# Patient Record
Sex: Male | Born: 1955 | Race: White | Hispanic: No | Marital: Married | Smoking: Former smoker
Health system: Southern US, Community
[De-identification: ages and names within clinical notes are randomized; demographics above are authoritative.]

## PROBLEM LIST (undated history)

## (undated) DIAGNOSIS — I10 Essential (primary) hypertension: Secondary | ICD-10-CM

## (undated) DIAGNOSIS — I499 Cardiac arrhythmia, unspecified: Secondary | ICD-10-CM

## (undated) DIAGNOSIS — E785 Hyperlipidemia, unspecified: Secondary | ICD-10-CM

## (undated) DIAGNOSIS — I2510 Atherosclerotic heart disease of native coronary artery without angina pectoris: Secondary | ICD-10-CM

## (undated) HISTORY — DX: Essential (primary) hypertension: I10

## (undated) HISTORY — DX: Cardiac arrhythmia, unspecified: I49.9

## (undated) HISTORY — DX: Atherosclerotic heart disease of native coronary artery without angina pectoris: I25.10

## (undated) HISTORY — DX: Hyperlipidemia, unspecified: E78.5

---

## 1998-02-23 ENCOUNTER — Emergency Department (HOSPITAL_COMMUNITY): Admission: EM | Admit: 1998-02-23 | Discharge: 1998-02-24 | Payer: Self-pay | Admitting: Emergency Medicine

## 1999-09-07 ENCOUNTER — Emergency Department (HOSPITAL_COMMUNITY): Admission: EM | Admit: 1999-09-07 | Discharge: 1999-09-07 | Payer: Self-pay | Admitting: Internal Medicine

## 1999-09-19 ENCOUNTER — Emergency Department (HOSPITAL_COMMUNITY): Admission: EM | Admit: 1999-09-19 | Discharge: 1999-09-19 | Payer: Self-pay | Admitting: Emergency Medicine

## 2000-01-18 ENCOUNTER — Emergency Department (HOSPITAL_COMMUNITY): Admission: EM | Admit: 2000-01-18 | Discharge: 2000-01-18 | Payer: Self-pay

## 2004-07-11 ENCOUNTER — Emergency Department (HOSPITAL_COMMUNITY): Admission: EM | Admit: 2004-07-11 | Discharge: 2004-07-11 | Payer: Self-pay | Admitting: Emergency Medicine

## 2004-07-20 ENCOUNTER — Emergency Department (HOSPITAL_COMMUNITY): Admission: EM | Admit: 2004-07-20 | Discharge: 2004-07-20 | Payer: Self-pay | Admitting: Emergency Medicine

## 2004-07-30 ENCOUNTER — Ambulatory Visit (HOSPITAL_COMMUNITY): Admission: RE | Admit: 2004-07-30 | Discharge: 2004-07-30 | Payer: Self-pay | Admitting: Orthopedic Surgery

## 2004-09-13 ENCOUNTER — Emergency Department (HOSPITAL_COMMUNITY): Admission: EM | Admit: 2004-09-13 | Discharge: 2004-09-13 | Payer: Self-pay | Admitting: Emergency Medicine

## 2005-11-06 ENCOUNTER — Emergency Department (HOSPITAL_COMMUNITY): Admission: EM | Admit: 2005-11-06 | Discharge: 2005-11-06 | Payer: Self-pay | Admitting: Emergency Medicine

## 2005-12-01 IMAGING — CR DG SHOULDER 2+V*L*
2 series · 2 of 2 positions shown · non-contrast
Comparison: 07/20/04 at [DATE] a.m.

CLINICAL DATA: Postreduction. 
 LEFT SHOULDER ? 2 VIEW:

[view not recorded (1 of 2)]
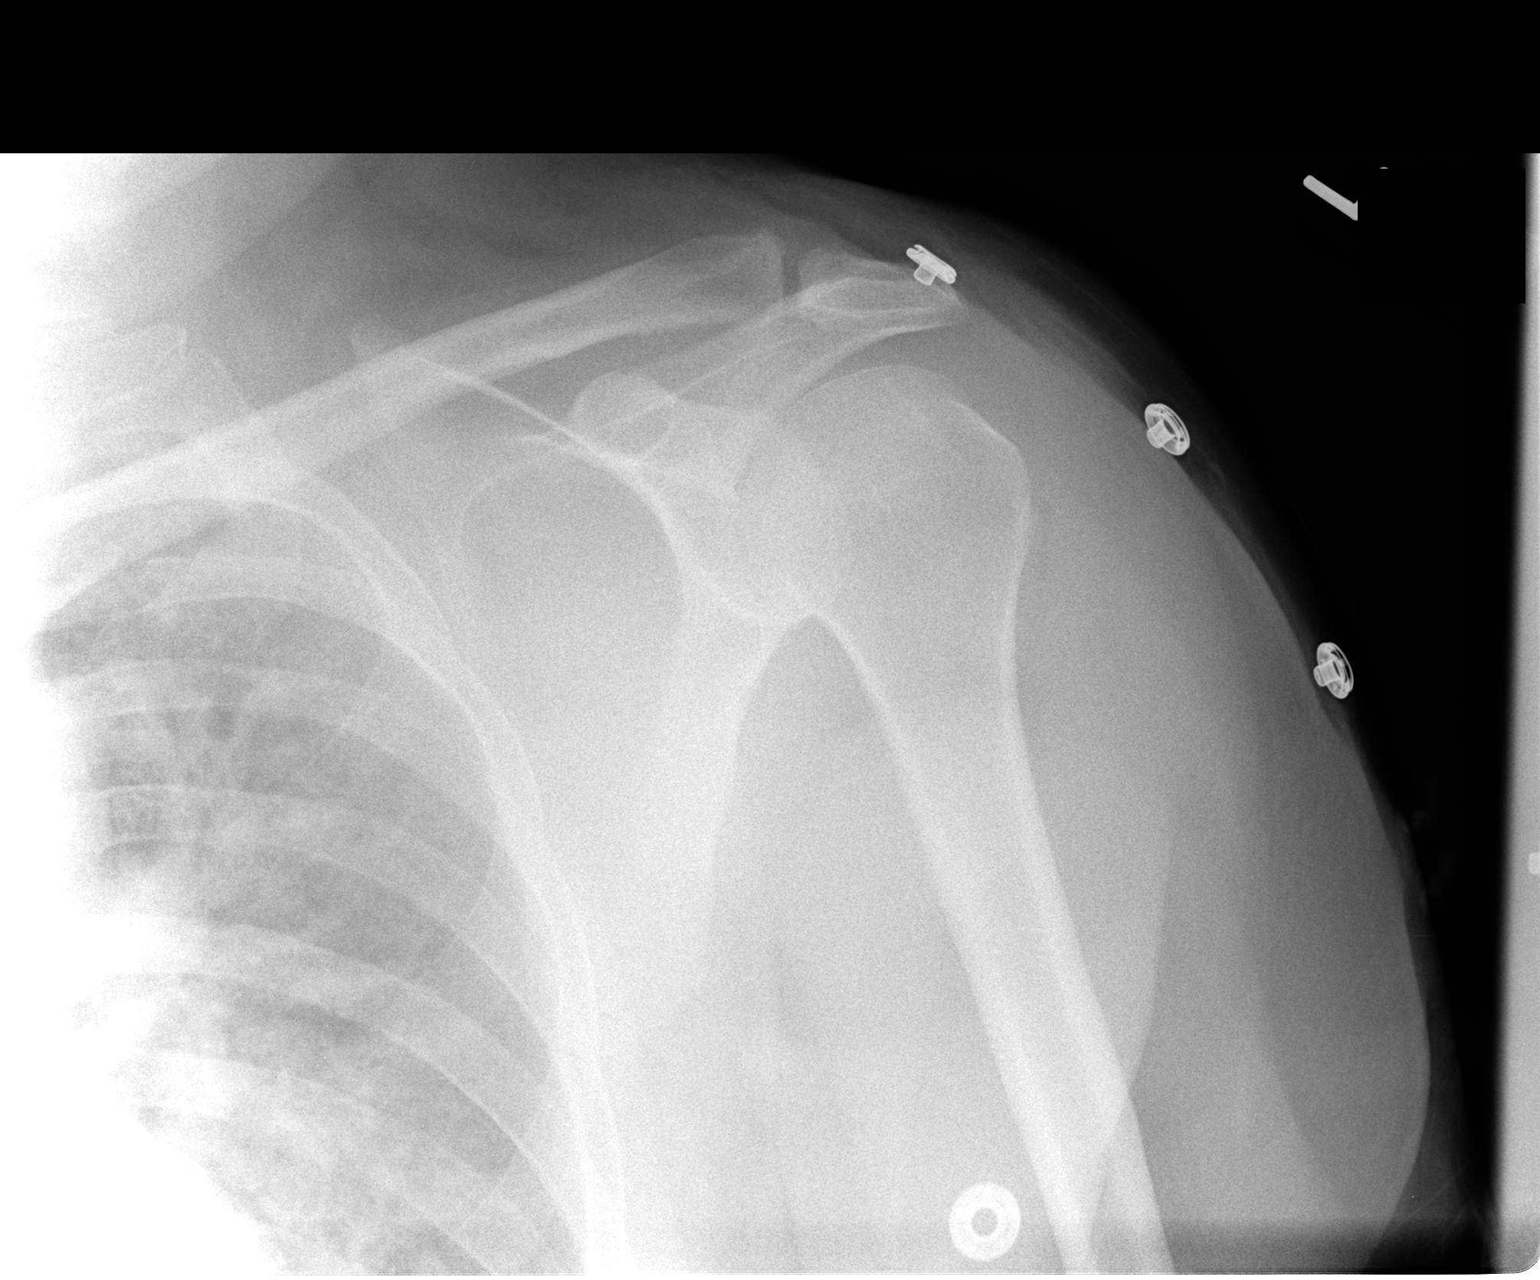

[view not recorded (2 of 2)]
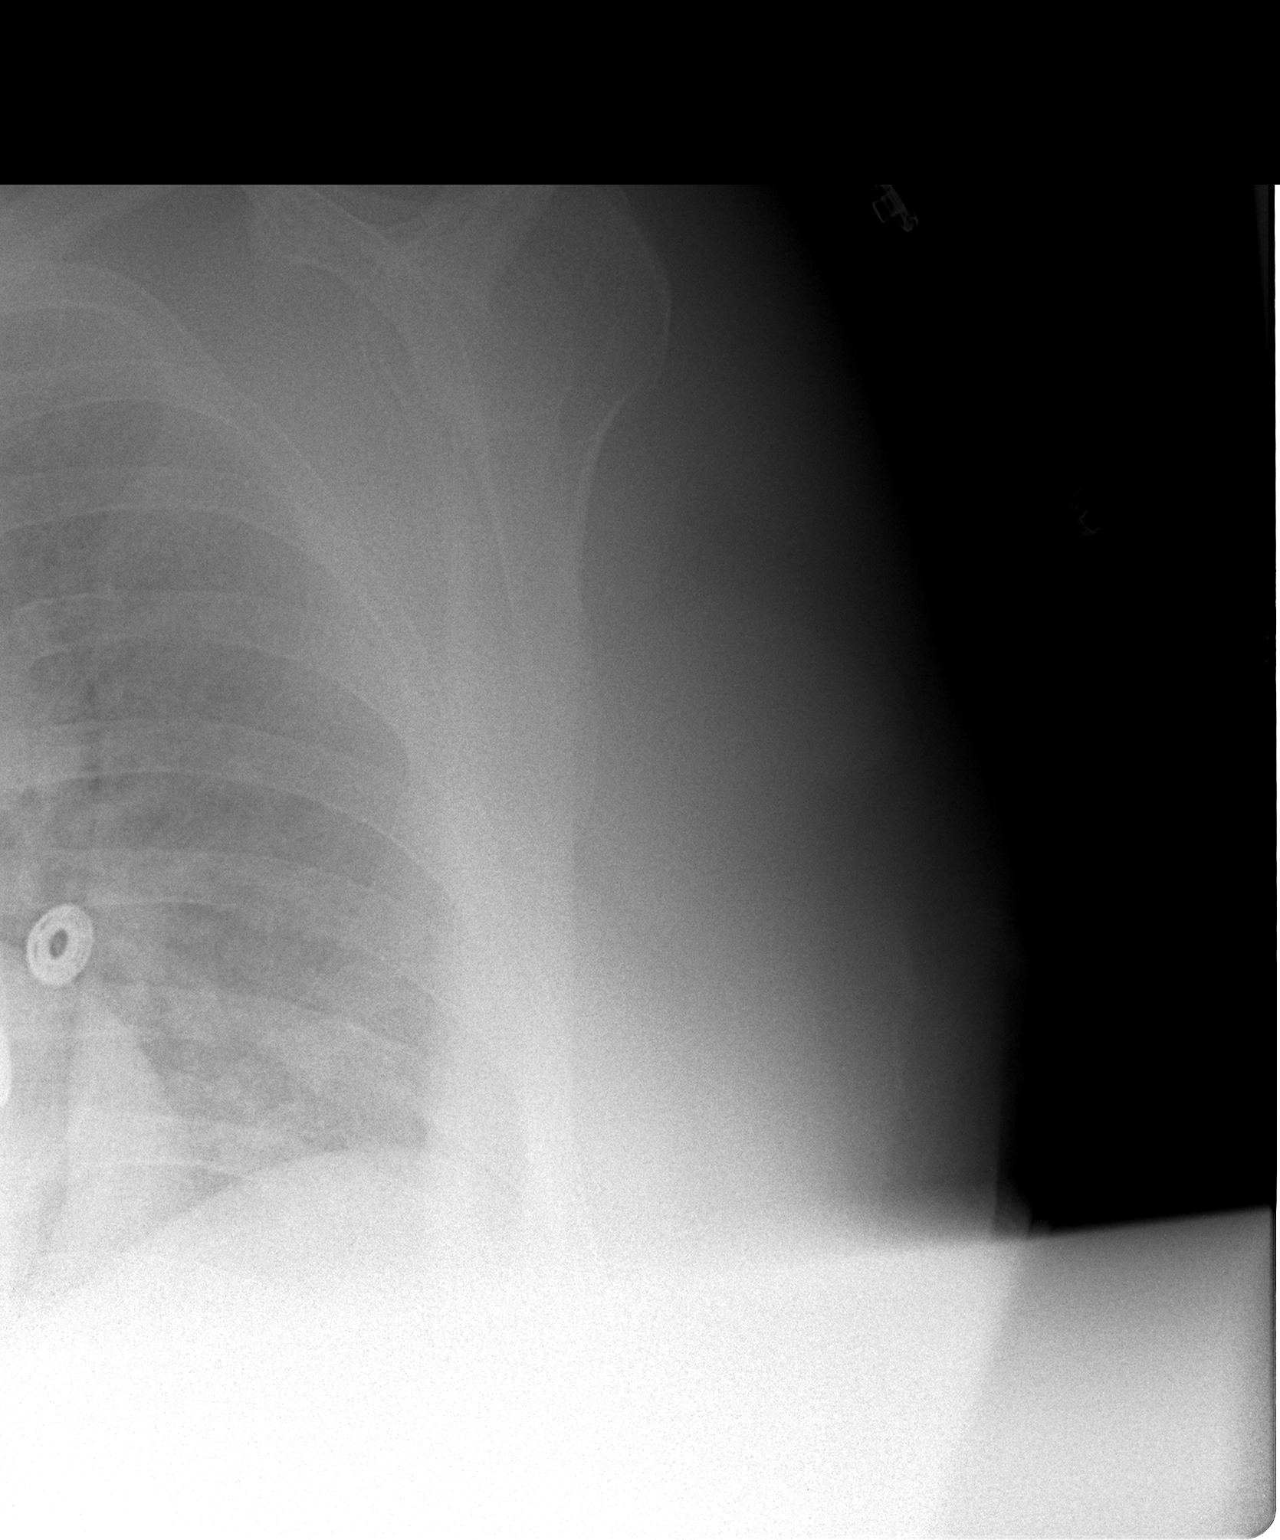

[2 of 2 positions shown; findings below may reference images not displayed]

Left humeral head has been reduced.  Hill-Sachs deformity.  Moderate degenerative changes acromioclavicular joint.
IMPRESSION: 1.  Reduction left humeral head with Hill-Sachs deformity noted. 
 2.  Moderate acromioclavicular joint degenerative changes.

## 2010-01-31 ENCOUNTER — Emergency Department (HOSPITAL_COMMUNITY): Admission: EM | Admit: 2010-01-31 | Discharge: 2010-02-01 | Payer: Self-pay | Admitting: Emergency Medicine

## 2011-12-02 ENCOUNTER — Ambulatory Visit: Payer: Self-pay | Admitting: Family Medicine

## 2011-12-02 VITALS — BP 181/98 | HR 97 | Temp 98.2°F | Resp 16 | Ht 66.25 in | Wt 200.2 lb

## 2011-12-02 DIAGNOSIS — I10 Essential (primary) hypertension: Secondary | ICD-10-CM

## 2011-12-02 DIAGNOSIS — R21 Rash and other nonspecific skin eruption: Secondary | ICD-10-CM

## 2011-12-02 DIAGNOSIS — L301 Dyshidrosis [pompholyx]: Secondary | ICD-10-CM

## 2011-12-02 LAB — POCT SKIN KOH: Skin KOH, POC: NEGATIVE

## 2011-12-02 MED ORDER — CLOBETASOL PROPIONATE 0.05 % EX OINT: TOPICAL_OINTMENT | Freq: Two times a day (BID) | CUTANEOUS | Status: DC

## 2011-12-02 MED ORDER — LISINOPRIL-HYDROCHLOROTHIAZIDE 10-12.5 MG PO TABS: 1.0000 | ORAL_TABLET | Freq: Every day | ORAL | Status: DC

## 2011-12-02 NOTE — Patient Instructions (Signed)
Ointment twice daily on the hands and feet. Rub in well.  Take blood pressure medication.  Work hard on weight loss.  Regular exercise.  Return in 2 months.

## 2011-12-02 NOTE — Progress Notes (Signed)
  Subjective: Patient is here complaining of a rash on his hands.  2 Years agohe had it on his feet. He has tried various over-the-counter medicines and mail-order medicines without help.  His blood pressure has been high for a long time. He has never had any treatment for it.  Objective: Thick scaly dermatosis of palms of his hands and soles of his feet skin scraping her palm was done. KOH is negative. Chest clear. Heart regular.  Assessment: Psoriatic eczema of hands and feet Hypertension  Plan: Clobetasol If not improving will do a punch bx and try some dovonox.  Lisinopril/hct  rtc 2 mo.

## 2012-05-04 ENCOUNTER — Ambulatory Visit: Payer: Self-pay | Admitting: Family Medicine

## 2012-05-04 VITALS — BP 155/108 | HR 88 | Temp 97.9°F | Resp 18 | Ht 66.5 in | Wt 199.2 lb

## 2012-05-04 DIAGNOSIS — R059 Cough, unspecified: Secondary | ICD-10-CM

## 2012-05-04 DIAGNOSIS — R05 Cough: Secondary | ICD-10-CM

## 2012-05-04 DIAGNOSIS — I10 Essential (primary) hypertension: Secondary | ICD-10-CM

## 2012-05-04 MED ORDER — PREDNISONE 20 MG PO TABS: ORAL_TABLET | ORAL | Status: DC

## 2012-05-04 MED ORDER — LISINOPRIL-HYDROCHLOROTHIAZIDE 20-25 MG PO TABS: 1.0000 | ORAL_TABLET | Freq: Every day | ORAL | Status: DC

## 2012-05-04 MED ORDER — BENZONATATE 100 MG PO CAPS: ORAL_CAPSULE | ORAL | Status: DC

## 2012-05-04 NOTE — Progress Notes (Signed)
Subjective: Patient checks his blood pressure daily at home. It's been running high. He takes his medicine faithfully, and has tolerated it well for years.  For the last 10 days she's been having a cough. The sputum is clear and whitish he coughs in paroxysms in the nighttime sometimes when he feels like he is mucus in his throat. Some time ago he had nighttime episodes like this every fall, but not last few years.  Objective: Mildly overweight white male no acute distress. TMs normal. Throat clear. Neck supple without nodes thyromegaly. Chest is clear to auscultation. Heart regular without murmurs gallops or arrhythmias. Pressure was 152/94 when I took it. Abdomen soft.  Assessment: Cough Probable post viral bronchitis Hypertension unsatisfactory control  Plan: Increase his blood pressure medicines Treat the cough with some prednisone and symptomatic care. Return if worse. No x-rays or labs today.

## 2012-05-04 NOTE — Patient Instructions (Signed)
Drink plenty of fluids  Double your dose of  blood pressure medicine to 2 pills every morning. When they ran out, the new prescription will be equivalent to double the current dose. Continue to monitor your blood pressures, with a goal of 120/80 or less. Get regular exercise, losing weight, and avoid excessive salt in the diet.  Take over-the-counter loratidine (Claritin) one daily Use cough pills and prednisone as directed. If symptoms persist let me know.

## 2012-05-09 ENCOUNTER — Ambulatory Visit (INDEPENDENT_AMBULATORY_CARE_PROVIDER_SITE_OTHER): Payer: Self-pay | Admitting: Physician Assistant

## 2012-05-09 VITALS — BP 158/82 | HR 72 | Temp 98.7°F | Resp 16 | Ht 66.25 in | Wt 204.0 lb

## 2012-05-09 DIAGNOSIS — R0982 Postnasal drip: Secondary | ICD-10-CM

## 2012-05-09 DIAGNOSIS — R0981 Nasal congestion: Secondary | ICD-10-CM

## 2012-05-09 DIAGNOSIS — J329 Chronic sinusitis, unspecified: Secondary | ICD-10-CM

## 2012-05-09 DIAGNOSIS — J3489 Other specified disorders of nose and nasal sinuses: Secondary | ICD-10-CM

## 2012-05-09 DIAGNOSIS — I10 Essential (primary) hypertension: Secondary | ICD-10-CM | POA: Insufficient documentation

## 2012-05-09 MED ORDER — IPRATROPIUM BROMIDE 0.03 % NA SOLN: 2.0000 | Freq: Two times a day (BID) | NASAL | Status: DC

## 2012-05-09 NOTE — Patient Instructions (Signed)
Begin using the Atrovent nasal spray twice daily for relief of nasal congestion and post-nasal drainage.  Also being using Claritin daily (not the kind with decongestant though!)  Finish the medications that Dr. Alwyn Ren gave you.  I do not think you need an antibiotic at this time.  I am reassured that you do not have a fever, that your lungs sound clear, and that you do not have sinus pain or sore throat.  If you are not improving in several days, or if you feel like you are getting worse, let us know and we can call in an antibiotic for you.

## 2012-05-09 NOTE — Progress Notes (Signed)
Subjective:    Patient ID: Colin Pena, male    DOB: Sep 11, 1955, 56 y.o.   MRN: [id number]  HPI  Colin Pena is a 56 yr old male who was seen here 5 days ago for URI symptoms.  He was prescribed prednisone and Tessalon.  States the prednisone has largely resolved his sinus pressure and that Tessalon has helped with cough, but is now complaining of "lots of mucus" in his throat that started two days ago.  He believes he has an infection and needs an antibiotic.  His voice is hoarse.  His throat is not sore.  He is coughing minimally and it is non-productive.  Feels like he has lots of mucus in his throat particularly when he lays down at night.  He has not had fever or chills.  No sinus or tooth pain.    Taking Mucinex, Tessalon, and prednisone.     Review of Systems  Constitutional: Negative for fever and chills.  HENT: Positive for congestion and postnasal drip. Negative for ear pain, sore throat, rhinorrhea and sinus pressure.   Respiratory: Negative for cough, shortness of breath and wheezing.   Cardiovascular: Negative.   Gastrointestinal: Negative.   Musculoskeletal: Negative.   Neurological: Negative for headaches.       Objective:   Physical Exam  Vitals reviewed. Constitutional: He is oriented to person, place, and time. He appears well-developed and well-nourished. No distress.  HENT:  Head: Normocephalic and atraumatic.  Right Ear: Tympanic membrane and ear canal normal.  Left Ear: Tympanic membrane and ear canal normal.  Nose: Nose normal. Right sinus exhibits no maxillary sinus tenderness and no frontal sinus tenderness. Left sinus exhibits no maxillary sinus tenderness and no frontal sinus tenderness.  Mouth/Throat: Uvula is midline and mucous membranes are normal. Posterior oropharyngeal erythema present. No oropharyngeal exudate, posterior oropharyngeal edema or tonsillar abscesses.       Post-nasal drainage  Neck: Neck supple.  Cardiovascular: Normal rate, regular  rhythm, normal heart sounds and intact distal pulses.  Exam reveals no gallop and no friction rub.   No murmur heard. Pulmonary/Chest: Effort normal and breath sounds normal. He has no wheezes. He has no rales.  Lymphadenopathy:    He has no cervical adenopathy.  Neurological: He is alert and oriented to person, place, and time.  Skin: Skin is warm and dry.  Psychiatric: He has a normal mood and affect. His behavior is normal.      Filed Vitals:   05/09/12 1622  BP: 158/82  Pulse: 72  Temp: 98.7 F (37.1 C)  Resp: 16        Assessment & Plan:   1. Post-nasal drainage  ipratropium (ATROVENT) 0.03 % nasal spray  2. Nasal congestion  ipratropium (ATROVENT) 0.03 % nasal spray  3. Hypertension       Colin Pena is a 56 yr old male here with post-nasal drainage and nasal congestion.  I do not think he needs an antibiotic at this time.  He is afebrile, lungs are CTA, no sinus tenderness.  There is post-nasal drainage in the posterior oropharynx but no exudate or swelling.  His throat is not sore.  I suspect his symptoms are due to post-nasal drainage.  I have prescribed Atrovent nasal spray and encouraged him to use Claritin as well.  Cautioned him against using products with pseudoephedrine due to his underlying hypertension.  Encouraged him to finish the medications Dr. Alwyn Ren prescribed for him.  Encouraged him to push fluids and rest  as much as possible.  If he is worsening or not improving, we can call in antibiotic for him at that time.  Pt is in agreement with this plan.

## 2012-05-23 ENCOUNTER — Telehealth: Payer: Self-pay

## 2012-05-23 MED ORDER — AMOXICILLIN 875 MG PO TABS: 875.0000 mg | ORAL_TABLET | Freq: Two times a day (BID) | ORAL | Status: DC

## 2012-05-23 NOTE — Telephone Encounter (Signed)
PT STATES HE IS STILL FULL OF A LOT OF MUCUS AND THINK HE MAY HAVE AN INFECTION. HAVE ALREADY BEEN SEEN 2 FOR THE SAME THING AND CAN'T AFFORD TO COME BACK IN WOULD LIKE TO KNOW IF WE COULD CALL HIM SOMETHING ELSE IN. PLEASE CALL [phone]   [facility] ON [address]

## 2012-05-23 NOTE — Telephone Encounter (Signed)
Rx for amoxicillin sent to pharmacy. If symptoms persist after this course of treatment, needs another OV

## 2012-05-24 NOTE — Telephone Encounter (Signed)
Left message for him to advise  

## 2012-11-13 ENCOUNTER — Other Ambulatory Visit: Payer: Self-pay | Admitting: Family Medicine

## 2012-11-17 ENCOUNTER — Other Ambulatory Visit: Payer: Self-pay | Admitting: Family Medicine

## 2013-02-17 ENCOUNTER — Encounter (HOSPITAL_COMMUNITY): Payer: Self-pay | Admitting: *Deleted

## 2013-02-17 ENCOUNTER — Emergency Department (HOSPITAL_COMMUNITY)
Admission: EM | Admit: 2013-02-17 | Discharge: 2013-02-17 | Disposition: A | Discharge status: Home / Self Care | Payer: Self-pay | Attending: Emergency Medicine | Admitting: Emergency Medicine

## 2013-02-17 ENCOUNTER — Emergency Department (HOSPITAL_COMMUNITY): Payer: Self-pay

## 2013-02-17 DIAGNOSIS — Y9389 Activity, other specified: Secondary | ICD-10-CM | POA: Insufficient documentation

## 2013-02-17 DIAGNOSIS — S43005A Unspecified dislocation of left shoulder joint, initial encounter: Secondary | ICD-10-CM

## 2013-02-17 DIAGNOSIS — Y929 Unspecified place or not applicable: Secondary | ICD-10-CM | POA: Insufficient documentation

## 2013-02-17 DIAGNOSIS — Z87891 Personal history of nicotine dependence: Secondary | ICD-10-CM | POA: Insufficient documentation

## 2013-02-17 DIAGNOSIS — X500XXA Overexertion from strenuous movement or load, initial encounter: Secondary | ICD-10-CM | POA: Insufficient documentation

## 2013-02-17 DIAGNOSIS — Z79899 Other long term (current) drug therapy: Secondary | ICD-10-CM | POA: Insufficient documentation

## 2013-02-17 DIAGNOSIS — I10 Essential (primary) hypertension: Secondary | ICD-10-CM | POA: Insufficient documentation

## 2013-02-17 DIAGNOSIS — S43016A Anterior dislocation of unspecified humerus, initial encounter: Secondary | ICD-10-CM | POA: Insufficient documentation

## 2013-02-17 MED ORDER — IBUPROFEN 600 MG PO TABS: 600.0000 mg | ORAL_TABLET | Freq: Four times a day (QID) | ORAL | Status: DC | PRN

## 2013-02-17 MED ORDER — PROPOFOL 10 MG/ML IV EMUL
INTRAVENOUS | Status: AC | PRN
  Administered 2013-02-17: 50 mg/h via INTRAVENOUS

## 2013-02-17 MED ORDER — HYDROMORPHONE HCL PF 1 MG/ML IJ SOLN
1.0000 mg | Freq: Once | INTRAMUSCULAR | Status: AC
  Administered 2013-02-17: 1 mg via INTRAVENOUS
  Filled 2013-02-17: qty 1

## 2013-02-17 MED ORDER — ONDANSETRON HCL 4 MG/2ML IJ SOLN
4.0000 mg | Freq: Once | INTRAMUSCULAR | Status: AC
  Administered 2013-02-17: 4 mg via INTRAVENOUS
  Filled 2013-02-17: qty 2

## 2013-02-17 MED ORDER — HYDROCODONE-ACETAMINOPHEN 5-325 MG PO TABS: ORAL_TABLET | ORAL | Status: DC

## 2013-02-17 MED ORDER — BUPIVACAINE HCL (PF) 0.5 % IJ SOLN
50.0000 mL | Freq: Once | INTRAMUSCULAR | Status: AC
  Administered 2013-02-17: 50 mL

## 2013-02-17 MED ORDER — ONDANSETRON 4 MG PO TBDP: 4.0000 mg | ORAL_TABLET | Freq: Three times a day (TID) | ORAL | Status: DC | PRN

## 2013-02-17 MED ORDER — PROPOFOL 10 MG/ML IV BOLUS
INTRAVENOUS | Status: AC
  Filled 2013-02-17: qty 1

## 2013-02-17 MED ORDER — PROPOFOL 10 MG/ML IV BOLUS
0.5000 mg/kg | Freq: Once | INTRAVENOUS | Status: AC
  Administered 2013-02-17: 200 mg via INTRAVENOUS

## 2013-02-17 NOTE — ED Provider Notes (Signed)
Procedure note. Shoulder reduction Patient was sedated with propofol. He is under full critical monitoring during this period there were no complications. Timeout was taken. Patient identified by 2 means. Suture using scapular manipulation gentle axial traction and external rotation shoulders is a reduced. He is awake following anesthesia. Sling and swath applied. Postreduction x-ray pending. There were no immediate complications.  Claudean Kinds, MD 02/17/13 [phone]

## 2013-02-17 NOTE — ED Notes (Signed)
Pt states that he has a hx of shoulder dislocation in the past and was walking and bent arm back and states that he feels that it is dislocated again, 20g rt ac 4mg  zofran and fen given pta, lt arm in sling

## 2013-02-17 NOTE — ED Provider Notes (Signed)
Procedural sedation Performed by: Claudean Kinds Consent: Verbal consent obtained. Risks and benefits: risks, benefits and alternatives were discussed Required items: required blood products, implants, devices, and special equipment available Patient identity confirmed: arm band and provided demographic data Time out: Immediately prior to procedure a "time out" was called to verify the correct patient, procedure, equipment, support staff and site/side marked as required.  Sedation type: moderate (conscious) sedation NPO time confirmed and considedered  Sedatives: PROPOFOL  Physician Time at Bedside: 25 minutes  Start:  08:10.  End 08:35  Vitals: Vital signs were monitored during sedation. Cardiac Monitor, pulse oximeter Patient tolerance: Patient tolerated the procedure well with no immediate complications. Comments: Pt with uneventful recovered. Returned to pre-procedural sedation baseline    Claudean Kinds, MD 02/17/13 [phone]

## 2013-02-17 NOTE — ED Notes (Signed)
Bed: UJ81 Expected date:  Expected time:  Means of arrival:  Comments: EMS 57yo M, shoulder dislocation

## 2013-02-17 NOTE — ED Provider Notes (Signed)
CSN: [id number]     Arrival date & time 02/17/13  0710 History   First MD Initiated Contact with Patient 02/17/13 [phone]     Chief Complaint  Patient presents with  . Shoulder Injury   (Consider location/radiation/quality/duration/timing/severity/associated sxs/prior Treatment) HPI Comments: Pt with h/o shoulder dislocation several years ago -- presents with acute onset of L shoulder pain just PTA when he ran into a metal pole while trying to reach for the pole with his arm extended in front of him. Arm held in flexion. No neck pain. Sensation/pulses intact. No other complaint. Fentanyl per EMS. The onset of this condition was acute. The course is constant. Aggravating factors: movement. Alleviating factors: none. Last oral intake last night.      Patient is a 57 y.o. male presenting with shoulder injury. The history is provided by the patient.  Shoulder Injury Associated symptoms include arthralgias. Pertinent negatives include no chest pain, coughing, fever, headaches, joint swelling, myalgias, nausea, neck pain, numbness, rash, sore throat, vomiting or weakness.    Past Medical History  Diagnosis Date  . Hypertension    History reviewed. No pertinent past surgical history. Family History  Problem Relation Age of Onset  . Cancer Father   . Hypertension Sister   . Cancer Maternal Aunt   . Heart disease Paternal Uncle   . Cancer Maternal Uncle   . Cancer Maternal Uncle   . Cancer Paternal Uncle   . Cancer Paternal Uncle    History  Substance Use Topics  . Smoking status: Former Games developer  . Smokeless tobacco: Not on file  . Alcohol Use: Not on file    Review of Systems  Constitutional: Positive for activity change. Negative for fever.  HENT: Negative for sore throat, rhinorrhea and neck pain.   Eyes: Negative for redness.  Respiratory: Negative for cough.   Cardiovascular: Negative for chest pain.  Gastrointestinal: Negative for nausea and vomiting.  Genitourinary: Negative  for dysuria.  Musculoskeletal: Positive for arthralgias. Negative for myalgias, back pain, joint swelling and gait problem.  Skin: Negative for rash and wound.  Neurological: Negative for weakness, numbness and headaches.    Allergies  Codeine  Home Medications   Current Outpatient Rx  Name  Route  Sig  Dispense  Refill  . amoxicillin (AMOXIL) 875 MG tablet   Oral   Take 1 tablet (875 mg total) by mouth 2 (two) times daily.   20 tablet   0   . benzonatate (TESSALON) 100 MG capsule      Use 1-2 tablets 3 times daily as necessary for cough. May be used with other cough medicines if needed.   30 capsule   0   . clobetasol ointment (TEMOVATE) 0.05 %      APPLY TOPICALLY 2 (TWO) TIMES DAILY.   60 g   2   . ipratropium (ATROVENT) 0.03 % nasal spray   Nasal   Place 2 sprays into the nose 2 (two) times daily.   30 mL   5   . lisinopril-hydrochlorothiazide (PRINZIDE,ZESTORETIC) 20-25 MG per tablet   Oral   Take 1 tablet by mouth daily. PATIENT NEEDS OFFICE VISIT FOR ADDITIONAL REFILLS   30 tablet   0   . predniSONE (DELTASONE) 20 MG tablet      Take 3 daily for 2 days, then 2 daily for 2 days, then 1 daily for 2 days   12 tablet   0    BP 139/81  Pulse 72  Temp(Src) 97.9 F (  36.6 C) (Oral)  Resp 15  SpO2 96% Physical Exam  Nursing note and vitals reviewed. Constitutional: He appears well-developed and well-nourished.  HENT:  Head: Normocephalic and atraumatic.  Mouth/Throat: Oropharynx is clear and moist.  Mallampati class 2  Eyes: Conjunctivae are normal. Right eye exhibits no discharge. Left eye exhibits no discharge.  Neck: Normal range of motion. Neck supple.  Cardiovascular: Normal rate, regular rhythm and normal heart sounds.   Pulmonary/Chest: Effort normal and breath sounds normal.  Abdominal: Soft. There is no tenderness.  Musculoskeletal:       Left shoulder: He exhibits decreased range of motion, tenderness and deformity (humeral head dislocated  anteriorly).       Cervical back: Normal.       Left upper arm: Normal.       Left forearm: Normal.       Left hand: Normal. Normal sensation noted. Decreased sensation is not present in the ulnar distribution, is not present in the medial redistribution and is not present in the radial distribution. Normal strength noted.  Neurological: He is alert.  Skin: Skin is warm and dry.  Psychiatric: He has a normal mood and affect.    ED Course  Procedures (including critical care time) Labs Review Labs Reviewed - No data to display Imaging Review Dg Shoulder Left  02/17/2013   *RADIOLOGY REPORT*  Clinical Data: Left shoulder dislocation, status post reduction  LEFT SHOULDER - 2+ VIEW  Comparison: Film from earlier in the same day.  Findings: The humeral head is been relocated.  No acute fracture is noted.  Degenerative changes of the acromioclavicular joint are seen.  Mild Hill-Sachs deformity is seen.  IMPRESSION: Relocation of the left humeral head.   Original Report Authenticated By: Alcide Clever, M.D.   Dg Shoulder Left  02/17/2013   *RADIOLOGY REPORT*  Clinical Data: Recent traumatic injury with pain  LEFT SHOULDER - 2+ VIEW  Comparison: 07/20/2004  Findings: There is noted dislocation of the humeral head anteriorly and inferiorly.  No associated fracture is noted.  No other focal abnormality is seen.  IMPRESSION: Anterior inferior dislocation of the left humeral head   Original Report Authenticated By: Alcide Clever, M.D.    7:28 AM Patient seen and examined. Work-up initiated. Medications ordered.   Vital signs reviewed and are as follows: Filed Vitals:   02/17/13 0715  BP: 139/81  Pulse: 72  Temp: 97.9 F (36.6 C)  Resp: 15   Pt d/w and seen by Dr. Fayrene Fearing.   Sedation and reduction per Dr. Fayrene Fearing note. 10cc lidocaine 1% was injected into the joint prior to reduction.   9:24 AM Post-reduction films reviewed by myself. Pt awake and alert, comfortable. Will d/c to home with pain  medication and ortho f/u. Instructed to use sling and continue non-weight bearing with L arm.   Patient was counseled on RICE protocol and told to rest injury, use ice for no longer than 15 minutes every hour, compress the area, and elevate above the level of their heart as much as possible to reduce swelling.  Questions answered.  Patient verbalized understanding.    Patient counseled on use of narcotic pain medications. Counseled not to combine these medications with others containing tylenol. Urged not to drink alcohol, drive, or perform any other activities that requires focus while taking these medications. The patient verbalizes understanding and agrees with the plan.   MDM   1. Shoulder dislocation, left, initial encounter    Pt with shoulder dislocation, reduced. UE  neurovascularly intact before and after procedure. Pt appears well. Ortho referral given.     Renne Crigler, PA-C 02/17/13 [phone]

## 2013-02-20 NOTE — ED Provider Notes (Signed)
Medical screening examination/treatment/procedure(s) were conducted as a shared visit with non-physician practitioner(s) and myself.  I personally evaluated the patient during the encounter  I saw and evaluated this patient. Minimal mechanism for his dislocation. Clinically has an anterior dislocated humeral head. Confirmed with x-ray. The patient underwent propofol sedation. This is without difficulty. Patient with successful relocation. A postprocedure x-ray showed proper positioning is placed into a shoulder immobilizer.   Claudean Kinds, MD 02/20/13 [phone]

## 2013-03-07 ENCOUNTER — Emergency Department (HOSPITAL_COMMUNITY)
Admission: EM | Admit: 2013-03-07 | Discharge: 2013-03-07 | Disposition: A | Discharge status: Home / Self Care | Payer: Self-pay | Attending: Emergency Medicine | Admitting: Emergency Medicine

## 2013-03-07 ENCOUNTER — Emergency Department (HOSPITAL_COMMUNITY): Payer: Self-pay

## 2013-03-07 ENCOUNTER — Encounter (HOSPITAL_COMMUNITY): Payer: Self-pay | Admitting: *Deleted

## 2013-03-07 DIAGNOSIS — S43005A Unspecified dislocation of left shoulder joint, initial encounter: Secondary | ICD-10-CM

## 2013-03-07 DIAGNOSIS — Z87891 Personal history of nicotine dependence: Secondary | ICD-10-CM | POA: Insufficient documentation

## 2013-03-07 DIAGNOSIS — Y9389 Activity, other specified: Secondary | ICD-10-CM | POA: Insufficient documentation

## 2013-03-07 DIAGNOSIS — Z79899 Other long term (current) drug therapy: Secondary | ICD-10-CM | POA: Insufficient documentation

## 2013-03-07 DIAGNOSIS — Y929 Unspecified place or not applicable: Secondary | ICD-10-CM | POA: Insufficient documentation

## 2013-03-07 DIAGNOSIS — S43006A Unspecified dislocation of unspecified shoulder joint, initial encounter: Secondary | ICD-10-CM | POA: Insufficient documentation

## 2013-03-07 DIAGNOSIS — X500XXA Overexertion from strenuous movement or load, initial encounter: Secondary | ICD-10-CM | POA: Insufficient documentation

## 2013-03-07 DIAGNOSIS — I10 Essential (primary) hypertension: Secondary | ICD-10-CM | POA: Insufficient documentation

## 2013-03-07 MED ORDER — ONDANSETRON HCL 4 MG/2ML IJ SOLN
INTRAMUSCULAR | Status: AC
  Filled 2013-03-07: qty 2

## 2013-03-07 MED ORDER — ONDANSETRON HCL 4 MG/2ML IJ SOLN
4.0000 mg | Freq: Once | INTRAMUSCULAR | Status: AC
  Administered 2013-03-07: 4 mg via INTRAVENOUS
  Filled 2013-03-07: qty 2

## 2013-03-07 MED ORDER — HYDROMORPHONE HCL PF 1 MG/ML IJ SOLN
1.0000 mg | Freq: Once | INTRAMUSCULAR | Status: AC
  Administered 2013-03-07: 1 mg via INTRAVENOUS
  Filled 2013-03-07: qty 1

## 2013-03-07 MED ORDER — PROPOFOL 10 MG/ML IV BOLUS
INTRAVENOUS | Status: AC | PRN
  Administered 2013-03-07: 47.65 mg via INTRAVENOUS

## 2013-03-07 MED ORDER — PROPOFOL 10 MG/ML IV BOLUS
0.5000 mg/kg | Freq: Once | INTRAVENOUS | Status: AC
  Administered 2013-03-07: 70 mg via INTRAVENOUS
  Filled 2013-03-07: qty 1

## 2013-03-07 NOTE — ED Notes (Signed)
Pt c/o shoulder pain, pt sts dislocated shoulder on labor day; was reduced and this morning it "popped back out". Pt sts he didn't wear sling for past two days.

## 2013-03-07 NOTE — ED Provider Notes (Addendum)
CSN: [id number]     Arrival date & time 03/07/13  0755 History   First MD Initiated Contact with Patient 03/07/13 [phone]     Chief Complaint  Patient presents with  . Shoulder Injury   (Consider location/radiation/quality/duration/timing/severity/associated sxs/prior Treatment) HPI Comments: History of shoulder dislocation 19 days ago was not wearing his sling and rolled over in bed today and dislocated his shoulder again. He states he felt a pop and has been unable to move his left arm with the pain for about the last one hour. He denies any numbness or tingling in the hands.  Denies trauma today.  With prior dislocation pt denies fracture.  Patient is a 57 y.o. male presenting with shoulder injury. The history is provided by the patient.  Shoulder Injury This is a recurrent problem. The current episode started 1 to 2 hours ago. The problem occurs constantly. The problem has not changed since onset.Associated symptoms comments: No numbness, weakness or tingling. The symptoms are aggravated by bending and twisting. Nothing relieves the symptoms. He has tried nothing for the symptoms. The treatment provided no relief.    Past Medical History  Diagnosis Date  . Hypertension    History reviewed. No pertinent past surgical history. Family History  Problem Relation Age of Onset  . Cancer Father   . Hypertension Sister   . Cancer Maternal Aunt   . Heart disease Paternal Uncle   . Cancer Maternal Uncle   . Cancer Maternal Uncle   . Cancer Paternal Uncle   . Cancer Paternal Uncle    History  Substance Use Topics  . Smoking status: Former Games developer  . Smokeless tobacco: Not on file  . Alcohol Use: Not on file    Review of Systems  All other systems reviewed and are negative.    Allergies  Codeine  Home Medications   Current Outpatient Rx  Name  Route  Sig  Dispense  Refill  . clobetasol ointment (TEMOVATE) 0.05 %   Topical   Apply 1 application topically 2 (two) times daily.          Marland Kitchen ibuprofen (ADVIL,MOTRIN) 600 MG tablet   Oral   Take 1 tablet (600 mg total) by mouth every 6 (six) hours as needed for pain.   20 tablet   0   . lisinopril-hydrochlorothiazide (PRINZIDE,ZESTORETIC) 20-25 MG per tablet   Oral   Take 1 tablet by mouth daily.          BP 154/100  Pulse 78  Temp(Src) 98 F (36.7 C) (Oral)  Resp 24  SpO2 97% Physical Exam  Nursing note and vitals reviewed. Constitutional: He is oriented to person, place, and time. He appears well-developed and well-nourished. He appears distressed.  HENT:  Head: Normocephalic and atraumatic.  Mouth/Throat: Oropharynx is clear and moist.  Eyes: Conjunctivae and EOM are normal. Pupils are equal, round, and reactive to light.  Neck: Normal range of motion. Neck supple.  Cardiovascular: Normal rate, regular rhythm and intact distal pulses.   No murmur heard. Pulmonary/Chest: Effort normal and breath sounds normal. No respiratory distress. He has no wheezes. He has no rales.  Musculoskeletal: He exhibits tenderness. He exhibits no edema.       Left shoulder: He exhibits decreased range of motion, tenderness, bony tenderness, deformity and pain. He exhibits no swelling, normal pulse and normal strength.  Left shoulder consistent with dislocation.  Neurological: He is alert and oriented to person, place, and time.  Skin: Skin is warm and dry.  No rash noted. No erythema.  Psychiatric: He has a normal mood and affect. His behavior is normal.    ED Course  Reduction of dislocation Date/Time: 03/07/2013 9:01 AM Performed by: Gwyneth Sprout Authorized by: Gwyneth Sprout Consent: Verbal consent obtained. Risks and benefits: risks, benefits and alternatives were discussed Consent given by: patient Patient understanding: patient states understanding of the procedure being performed Site marked: the operative site was marked Patient identity confirmed: verbally with patient Local anesthesia used:  no Patient sedated: yes Patient tolerance: Patient tolerated the procedure well with no immediate complications. Comments: Left shoulder dislocation reduction.  After pt sedated left arm was extended and externally rotated with a pop of the bone back in the joint.  2+ radial pulses and normal deltoid sensation.  Shoulder immobilizer placed.   (including critical care time) Labs Review Labs Reviewed - No data to display Imaging Review Dg Shoulder Left  03/07/2013   CLINICAL DATA:  POST LEFT SHOULDER REDUCTION.  EXAM: LEFT SHOULDER - 2+ VIEW  COMPARISON:  02/17/2013  FINDINGS: LEFT HUMERAL HEAD IS LOCATED. HILL-SACHS DEFORMITY MAY BE PRESENT. ACROMIOCLAVICULAR JOINT DEGENERATIVE CHANGES.  VISUALIZED LUNGS CLEAR.  IMPRESSION:  LEFT HUMERAL HEAD IS LOCATED. HILL-SACHS DEFORMITY MAY BE PRESENT. ACROMIOCLAVICULAR JOINT DEGENERATIVE CHANGES.   Electronically Signed   By: Bridgett Larsson   On: 03/07/2013 10:42    Procedural sedation Performed by: Gwyneth Sprout Consent: Verbal consent obtained. Risks and benefits: risks, benefits and alternatives were discussed Required items: required blood products, implants, devices, and special equipment available Patient identity confirmed: arm band and provided demographic data Time out: Immediately prior to procedure a "time out" was called to verify the correct patient, procedure, equipment, support staff and site/side marked as required.  Sedation type: moderate (conscious) sedation NPO time confirmed and considedered  Sedatives: PROPOFOL  Physician Time at Bedside: 25  Vitals: Vital signs were monitored during sedation. Cardiac Monitor, pulse oximeter Patient tolerance: Patient tolerated the procedure well with no immediate complications. Comments: Pt with uneventful recovered. Returned to pre-procedural sedation baseline     MDM   1. Shoulder dislocation, left, initial encounter     Pt here with recurrent shoulder dislocation.  Pt is  requesting sedation.  Pt is N/V intact and denies trauma today. Last dislocated 19 days ago after a fall.  No fractures at that time but this am was not wearing his sling and rolled over in bed and popped it out.  Sedation and relocation as above.  Reduction films with normal shoulder now.  Will give f/u with ortho as pt comes out easily and may need surgery.    Gwyneth Sprout, MD 03/07/13 1478  Gwyneth Sprout, MD 03/07/13 1043  Gwyneth Sprout, MD 03/07/13 1046

## 2013-04-05 ENCOUNTER — Ambulatory Visit: Payer: Self-pay | Admitting: Emergency Medicine

## 2013-04-05 VITALS — BP 158/84 | HR 88 | Temp 98.0°F | Resp 16 | Ht 66.5 in | Wt 198.0 lb

## 2013-04-05 DIAGNOSIS — L259 Unspecified contact dermatitis, unspecified cause: Secondary | ICD-10-CM

## 2013-04-05 DIAGNOSIS — L309 Dermatitis, unspecified: Secondary | ICD-10-CM

## 2013-04-05 DIAGNOSIS — I10 Essential (primary) hypertension: Secondary | ICD-10-CM

## 2013-04-05 MED ORDER — LISINOPRIL-HYDROCHLOROTHIAZIDE 20-25 MG PO TABS: 1.0000 | ORAL_TABLET | Freq: Every day | ORAL | Status: DC

## 2013-04-05 MED ORDER — CLOBETASOL PROPIONATE 0.05 % EX OINT: 1.0000 "application " | TOPICAL_OINTMENT | Freq: Two times a day (BID) | CUTANEOUS | Status: DC

## 2013-04-05 NOTE — Patient Instructions (Signed)

## 2013-04-05 NOTE — Progress Notes (Signed)
[facility] [address], [place] [address] [phone]  Date:  04/05/2013   Name:  Colin Pena   DOB:  Oct 05, 1955   MRN:  [id number]  PCP:  No primary provider on file.    Chief Complaint: Medication Refill   History of Present Illness:  DEVINN HURWITZ is a 57 y.o. very pleasant male patient who presents with the following:  History of hypertension and took his las pill yesterday.  Tolerating treatment well with no adverse effects.  Has dry cracking skin on his hands worsening.  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  Denies other complaint or health concern today.   Patient Active Problem List   Diagnosis Date Noted  . Hypertension 05/09/2012    Past Medical History  Diagnosis Date  . Hypertension     History reviewed. No pertinent past surgical history.  History  Substance Use Topics  . Smoking status: Former Games developer  . Smokeless tobacco: Not on file  . Alcohol Use: Not on file    Family History  Problem Relation Age of Onset  . Cancer Father   . Hypertension Sister   . Cancer Maternal Aunt   . Heart disease Paternal Uncle   . Cancer Maternal Uncle   . Cancer Maternal Uncle   . Cancer Paternal Uncle   . Cancer Paternal Uncle     Allergies  Allergen Reactions  . Codeine Nausea And Vomiting    Medication list has been reviewed and updated.  Current Outpatient Prescriptions on File Prior to Visit  Medication Sig Dispense Refill  . clobetasol ointment (TEMOVATE) 0.05 % Apply 1 application topically 2 (two) times daily.      Marland Kitchen ibuprofen (ADVIL,MOTRIN) 600 MG tablet Take 1 tablet (600 mg total) by mouth every 6 (six) hours as needed for pain.  20 tablet  0  . lisinopril-hydrochlorothiazide (PRINZIDE,ZESTORETIC) 20-25 MG per tablet Take 1 tablet by mouth daily.       No current facility-administered medications on file prior to visit.    Review of Systems:  As per HPI, otherwise  negative.    Physical Examination: Filed Vitals:   04/05/13 1641  BP: 158/84  Pulse: 88  Temp: 98 F (36.7 C)  Resp: 16   Filed Vitals:   04/05/13 1641  Height: 5' 6.5" (1.689 m)  Weight: 198 lb (89.812 kg)   Body mass index is 31.48 kg/(m^2). Ideal Body Weight: Weight in (lb) to have BMI = 25: 156.9  GEN: WDWN, NAD, Non-toxic, A & O x 3 HEENT: Atraumatic, Normocephalic. Neck supple. No masses, No LAD. Ears and Nose: No external deformity. CV: RRR, No M/G/R. No JVD. No thrill. No extra heart sounds. PULM: CTA B, no wheezes, crackles, rhonchi. No retractions. No resp. distress. No accessory muscle use. ABD: S, NT, ND, +BS. No rebound. No HSM. EXTR: No c/c/e NEURO Normal gait.  PSYCH: Normally interactive. Conversant. Not depressed or anxious appearing.  Calm demeanor.  SKiN;  Fissures on hands eczematous changes.  Assessment and Plan: Hypertension Eczema of hands Refill meds   Signed,  Phillips Odor, MD

## 2013-05-28 ENCOUNTER — Encounter (HOSPITAL_COMMUNITY): Payer: Self-pay | Admitting: Emergency Medicine

## 2013-05-28 ENCOUNTER — Emergency Department (HOSPITAL_COMMUNITY)
Admission: EM | Admit: 2013-05-28 | Discharge: 2013-05-29 | Disposition: A | Discharge status: Home / Self Care | Payer: Self-pay | Attending: Emergency Medicine | Admitting: Emergency Medicine

## 2013-05-28 ENCOUNTER — Emergency Department (HOSPITAL_COMMUNITY): Payer: Self-pay

## 2013-05-28 DIAGNOSIS — Y929 Unspecified place or not applicable: Secondary | ICD-10-CM | POA: Insufficient documentation

## 2013-05-28 DIAGNOSIS — Z87891 Personal history of nicotine dependence: Secondary | ICD-10-CM | POA: Insufficient documentation

## 2013-05-28 DIAGNOSIS — M25562 Pain in left knee: Secondary | ICD-10-CM

## 2013-05-28 DIAGNOSIS — Y939 Activity, unspecified: Secondary | ICD-10-CM | POA: Insufficient documentation

## 2013-05-28 DIAGNOSIS — Z79899 Other long term (current) drug therapy: Secondary | ICD-10-CM | POA: Insufficient documentation

## 2013-05-28 DIAGNOSIS — I10 Essential (primary) hypertension: Secondary | ICD-10-CM | POA: Insufficient documentation

## 2013-05-28 DIAGNOSIS — X500XXA Overexertion from strenuous movement or load, initial encounter: Secondary | ICD-10-CM | POA: Insufficient documentation

## 2013-05-28 DIAGNOSIS — S8990XA Unspecified injury of unspecified lower leg, initial encounter: Secondary | ICD-10-CM | POA: Insufficient documentation

## 2013-05-28 MED ORDER — TRAMADOL HCL 50 MG PO TABS: 50.0000 mg | ORAL_TABLET | Freq: Four times a day (QID) | ORAL | Status: DC | PRN

## 2013-05-28 MED ORDER — NAPROXEN 500 MG PO TABS: 500.0000 mg | ORAL_TABLET | Freq: Two times a day (BID) | ORAL | Status: DC

## 2013-05-28 NOTE — ED Notes (Signed)
Pt. reports left knee pain onset yesterday , felt a " pop" behind left knee this afternoon , denies injury or fall , pain worse when bearing weight on left leg.

## 2013-05-28 NOTE — ED Notes (Signed)
Patient transported to X-ray 

## 2013-05-28 NOTE — ED Notes (Signed)
Awaiting crutches from ortho, do not have size in department. Will discharge after.

## 2013-05-28 NOTE — ED Provider Notes (Signed)
CSN: [id number]     Arrival date & time 05/28/13  2115 History   First MD Initiated Contact with Patient 05/28/13 2218     Chief Complaint  Patient presents with  . Knee Pain   (Consider location/radiation/quality/duration/timing/severity/associated sxs/prior Treatment) HPI Colin Pena is a 57 y.o. male who presents to ED complaining of knee pain. States stood up today and felt a "pop" behind left knee. States since then, pain behind the knee and pain with bearing weight. Pt states he has full ROM of the joint, but unable to bear weight. Denies weakness or numbness distally. Denies swelling of the leg. No pain in his calf or foot. Denies any recent travel or surgeries. Denies injuries. Did not take any medications for this.    Past Medical History  Diagnosis Date  . Hypertension    History reviewed. No pertinent past surgical history. Family History  Problem Relation Age of Onset  . Cancer Father   . Hypertension Sister   . Cancer Maternal Aunt   . Heart disease Paternal Uncle   . Cancer Maternal Uncle   . Cancer Maternal Uncle   . Cancer Paternal Uncle   . Cancer Paternal Uncle    History  Substance Use Topics  . Smoking status: Former Games developer  . Smokeless tobacco: Not on file  . Alcohol Use: No    Review of Systems  Constitutional: Negative for fever and chills.  Respiratory: Negative for cough, chest tightness and shortness of breath.   Cardiovascular: Negative for chest pain, palpitations and leg swelling.  Gastrointestinal: Negative for nausea, vomiting, abdominal pain, diarrhea and abdominal distention.  Genitourinary: Negative for dysuria.  Musculoskeletal: Positive for arthralgias. Negative for joint swelling.  Skin: Negative for rash and wound.  Allergic/Immunologic: Negative for immunocompromised state.  Neurological: Negative for dizziness, weakness, light-headedness, numbness and headaches.    Allergies  Codeine  Home Medications   Current  Outpatient Rx  Name  Route  Sig  Dispense  Refill  . lisinopril-hydrochlorothiazide (PRINZIDE,ZESTORETIC) 20-25 MG per tablet   Oral   Take 1 tablet by mouth daily.   30 tablet   5   . naproxen (NAPROSYN) 500 MG tablet   Oral   Take 1 tablet (500 mg total) by mouth 2 (two) times daily.   30 tablet   0   . traMADol (ULTRAM) 50 MG tablet   Oral   Take 1 tablet (50 mg total) by mouth every 6 (six) hours as needed.   15 tablet   0    BP 140/99  Pulse 93  Temp(Src) 98 F (36.7 C) (Oral)  Resp 18  Ht 5\' 9"  (1.753 m)  Wt 208 lb (94.348 kg)  BMI 30.70 kg/m2  SpO2 99% Physical Exam  Nursing note and vitals reviewed. Constitutional: He appears well-developed and well-nourished. No distress.  HENT:  Head: Normocephalic and atraumatic.  Eyes: Conjunctivae are normal.  Neck: Neck supple.  Cardiovascular: Normal rate, regular rhythm and normal heart sounds.   Pulmonary/Chest: Effort normal. No respiratory distress. He has no wheezes. He has no rales.  Abdominal: Soft. Bowel sounds are normal. He exhibits no distension. There is no tenderness. There is no rebound.  Musculoskeletal: He exhibits no edema.  Normal appearing knee. No tenderness with palpation. Full ROm of the joint. Negative anterior and posterior drawer signs. No laxity with medial or lateral stress. Pain with apply's maneuver. No calf swelling or tenderness. Negative homans sign. dp pulses intact bilaterally  Neurological: He is alert.  Skin: Skin is warm and dry.    ED Course  Procedures (including critical care time) Labs Review Labs Reviewed - No data to display Imaging Review Dg Knee Complete 4 Views Left  05/28/2013   CLINICAL DATA:  Left knee pain and pop. Unable to bear weight on left leg.  EXAM: LEFT KNEE - COMPLETE 4+ VIEW  COMPARISON:  None.  FINDINGS: There is no evidence of fracture or dislocation. The joint spaces are preserved. No significant degenerative change is seen; the patellofemoral joint is  grossly unremarkable in appearance.  A small knee joint effusion is noted. The visualized soft tissues are otherwise unremarkable in appearance.  IMPRESSION: 1. No evidence of fracture or dislocation. 2. Small knee joint effusion noted.   Electronically Signed   By: Roanna Raider M.D.   On: 05/28/2013 22:45    EKG Interpretation   None       MDM   1. Knee pain, acute, left     Pt with knee pain after feeling a pop behind the joint. Small knee effusion noted. Full ROM of the knee. Pain with bearing weight. No signs of DVT, although it was considered. No signs of infection. Home with a knee sleeve, elevation, ice, naprosyn, ultram. Follow up with pcp and orthopedics specialist.   Filed Vitals:   05/28/13 2145  BP: 140/99  Pulse: 93  Temp: 98 F (36.7 C)  Resp: 18       Enaya Howze A Rube Sanchez, PA-C 05/28/13 2336

## 2013-05-29 NOTE — ED Provider Notes (Signed)
Medical screening examination/treatment/procedure(s) were performed by non-physician practitioner and as supervising physician I was immediately available for consultation/collaboration.  EKG Interpretation   None         Shanna Cisco, MD 05/29/13 [phone]

## 2014-02-28 ENCOUNTER — Other Ambulatory Visit: Payer: Self-pay | Admitting: Emergency Medicine

## 2014-04-01 ENCOUNTER — Other Ambulatory Visit: Payer: Self-pay | Admitting: Physician Assistant

## 2014-04-29 ENCOUNTER — Ambulatory Visit (INDEPENDENT_AMBULATORY_CARE_PROVIDER_SITE_OTHER): Payer: Self-pay | Admitting: Family Medicine

## 2014-04-29 ENCOUNTER — Other Ambulatory Visit: Payer: Self-pay | Admitting: Physician Assistant

## 2014-04-29 VITALS — BP 162/90 | HR 73 | Temp 98.3°F | Resp 16 | Ht 67.5 in | Wt 194.0 lb

## 2014-04-29 DIAGNOSIS — L209 Atopic dermatitis, unspecified: Secondary | ICD-10-CM

## 2014-04-29 DIAGNOSIS — Z23 Encounter for immunization: Secondary | ICD-10-CM

## 2014-04-29 DIAGNOSIS — I10 Essential (primary) hypertension: Secondary | ICD-10-CM

## 2014-04-29 MED ORDER — CLOBETASOL PROPIONATE 0.05 % EX CREA: 1.0000 "application " | TOPICAL_CREAM | Freq: Two times a day (BID) | CUTANEOUS | Status: DC

## 2014-04-29 MED ORDER — AMLODIPINE BESYLATE 5 MG PO TABS: ORAL_TABLET | ORAL | Status: DC

## 2014-04-29 MED ORDER — LISINOPRIL-HYDROCHLOROTHIAZIDE 20-25 MG PO TABS: ORAL_TABLET | ORAL | Status: DC

## 2014-04-29 NOTE — Progress Notes (Signed)
Subjective: Patient is here for his medicines to be refilled. He inquired about a flu shot. He was not convinced he should have one, but we had a long talk about the value of getting them. He does not have insurance, and does not want a lot of testing done.  Objective: Pleasant gentleman in no acute distress. TMs normal. Has a little excoriation on the rim of the ear about mid ear which apparently is fairly chronic. He has ruddy skin. Throat normal. Neck supple without nodes. Chest clear. Heart regular without murmurs. Abdomen soft nontender.he has severe eczema on his hands and feet.  Assessment: Hypertension Eczema of hands and feet  Plan: Clobetasol Add amlodipine to his lisinopril HCT. Flu shot  Return as needed. Recommended that he return some time for us to treat the actinic keratosis within the next 6 months.

## 2014-04-29 NOTE — Patient Instructions (Addendum)
Actinic Keratosis Actinic keratosis is a precancerous growth on the skin. This means it could develop into skin cancer if it is not treated. About 1% of actinic keratoses turn into skin cancer within a year. It is important to have all such growths removed to prevent them from developing into skin cancer. CAUSES  Actinic keratosis is caused by getting too much ultraviolet (UV) radiation from the sun or other UV light sources. RISK FACTORS Factors that increase your chances of getting actinic keratosis include:  Having light-colored skin and blue eyes.  Having blonde or red hair.  Spending a lot of time in the sun.  Age. The risk of actinic keratosis increases with age. SYMPTOMS  Actinic keratosis growths look like scaly, rough spots of skin. They can be as small as a pinhead or as big as a quarter. They may itch, hurt, or feel sensitive. Sometimes there is a little tag of pink or gray skin growing off them. In some cases, actinic keratoses are easier felt than seen. They do not go away with the use of moisturizing lotions or creams. Actinic keratoses appear most often on areas of skin that get a lot of sun exposure. These areas include the:  Scalp.  Face.  Ears.  Lips.  Upper back.  Backs of the hands.  Forearms. DIAGNOSIS  Your health care provider can usually tell what is wrong by performing a physical exam. A tissue sample (biopsy) may also be taken and examined under a microscope. TREATMENT  Actinic keratosis can be treated several ways. Most treatments can be done in your health care provider's office. Treatment options may include:  Curettage. A tool is used to gently scrape off the growth.  Cryosurgery. Liquid nitrogen is applied to the growth to freeze it. The growth eventually falls off the skin.  Medicated creams, such as 5-fluorouracil or imiquimod. The medicine destroys the cells in the growth.  Chemical peels. Chemicals are applied to the growth and the outer  layers of skin are peeled off.  Photodynamic therapy. A drug that makes your skin more sensitive to light is applied to the skin. A strong, blue light is aimed at the skin and destroys the growth. PREVENTION  To prevent future sun damage:  Try to avoid the sun between 10:00 a.m. and 4:00 p.m. when it is the strongest.  Use a sunscreen or sunblock with SPF 30 or greater.  Apply sunscreen at least 30 minutes before exposure to the sun.  Always wear protective hats, clothing, and sunglasses with UV protection.  Avoid medicines, herbs, and foods that increase your sensitivity to sunlight.  Avoid tanning beds. HOME CARE INSTRUCTIONS   If your skin was covered with a bandage, change and remove the bandage as directed by your health care provider.  Keep the treated area dry as directed by your health care provider.  Apply any creams as prescribed by your health care provider. Follow the directions carefully.  Check your skin regularly for any changes.  Visit a skin doctor (dermatologist) every year for a skin exam. SEEK MEDICAL CARE IF:   Your skin does not heal and becomes irritated, red, or bleeds.  You notice any changes or new growths on your skin. Document Released: 09/01/2008 Document Revised: 10/20/2013 Document Reviewed: 07/17/2011 [facility] Patient Information 2015 [place], [place]. This information is not intended to replace advice given to you by your health care provider. Make sure you discuss any questions you have with your health care provider.   Advise returning  some time  In the nex 6 months to get the rim of your ear treated with cryosurgery (freezing) unless the area clears up completely in the near future.  Use the clobetasol cream on your hands and feet once or twice daily, a small amount rubbed in well.after rubbing this on, I would recommend the use of a good hand lotion also to try and keep your skin moisture and softer.  Advise returning for general checkup  and baseline labs to check kidney function, blood sugar, cholesterol, etc.  Take the lisinopril/hydrochlorothiazide 1 daily or blood pressure  Begin taking amlodipine 5 mg one daily for blood pressure also  Return in 6 months for a recheck of your blood pressure. If the home blood pressure readings do not improve with the new medication, come in sooner. Your goal should be to have a blood pressure pretty consistently running below 140/90.  Advise trying to work hard on eating less and getting more regular exercise in order to lose some weight. This would help your blood pressure and reduce some of your other heart risk factors.Influenza Vaccine (Flu Vaccine,   Inactivated or Recombinant) 2014-2015: What You Need to Know 1. Why get vaccinated? Influenza ("flu") is a contagious disease that spreads around the [place] every winter, usually between October and May. Flu is caused by influenza viruses, and is spread mainly by coughing, sneezing, and close contact. Anyone can get flu, but the risk of getting flu is highest among children. Symptoms come on suddenly and may last several days. They can include:  fever/chills  sore throat  muscle aches  fatigue  cough  headache  runny or stuffy nose Flu can make some people much sicker than others. These people include young children, people 7665 and older, pregnant women, and people with certain health conditions-such as heart, lung or kidney disease, nervous system disorders, or a weakened immune system. Flu vaccination is especially important for these people, and anyone in close contact with them. Flu can also lead to pneumonia, and make existing medical conditions worse. It can cause diarrhea and seizures in children. Each year thousands of people in the [place] die from flu, and many more are hospitalized. Flu vaccine is the best protection against flu and its complications. Flu vaccine also helps prevent spreading flu from  person to person. 2. Inactivated and recombinant flu vaccines You are getting an injectable flu vaccine, which is either an "inactivated" or "recombinant" vaccine. These vaccines do not contain any live influenza virus. They are given by injection with a needle, and often called the "flu shot."  A different live, attenuated (weakened) influenza vaccine is sprayed into the nostrils. This vaccine is described in a separate Vaccine Information Statement. Flu vaccination is recommended every year. Some children 6 months through 998 years of age might need two doses during one year. Flu viruses are always changing. Each year's flu vaccine is made to protect against 3 or 4 viruses that are likely to cause disease that year. Flu vaccine cannot prevent all cases of flu, but it is the best defense against the disease.  It takes about 2 weeks for protection to develop after the vaccination, and protection lasts several months to a year. Some illnesses that are not caused by influenza virus are often mistaken for flu. Flu vaccine will not prevent these illnesses. It can only prevent influenza. Some inactivated flu vaccine contains a very small amount of a mercury-based preservative called thimerosal. Studies have shown that thimerosal  in vaccines is not harmful, but flu vaccines that do not contain a preservative are available. 3. Some people should not get this vaccine Tell the person who gives you the vaccine:  If you have any severe, life-threatening allergies. If you ever had a life-threatening allergic reaction after a dose of flu vaccine, or have a severe allergy to any part of this vaccine, including (for example) an allergy to gelatin, antibiotics, or eggs, you may be advised not to get vaccinated. Most, but not all, types of flu vaccine contain a small amount of egg protein.  If you ever had Guillain-Barr Syndrome (a severe paralyzing illness, also called GBS). Some people with a history of GBS should not  get this vaccine. This should be discussed with your doctor.  If you are not feeling well. It is usually okay to get flu vaccine when you have a mild illness, but you might be advised to wait until you feel better. You should come back when you are better. 4. Risks of a vaccine reaction With a vaccine, like any medicine, there is a chance of side effects. These are usually mild and go away on their own. Problems that could happen after any vaccine:  Brief fainting spells can happen after any medical procedure, including vaccination. Sitting or lying down for about 15 minutes can help prevent fainting, and injuries caused by a fall. Tell your doctor if you feel dizzy, or have vision changes or ringing in the ears.  Severe shoulder pain and reduced range of motion in the arm where a shot was given can happen, very rarely, after a vaccination.  Severe allergic reactions from a vaccine are very rare, estimated at less than 1 in a million doses. If one were to occur, it would usually be within a few minutes to a few hours after the vaccination. Mild problems following inactivated flu vaccine:  soreness, redness, or swelling where the shot was given  hoarseness  sore, red or itchy eyes  cough  fever  aches  headache  itching  fatigue If these problems occur, they usually begin soon after the shot and last 1 or 2 days. Moderate problems following inactivated flu vaccine:  Young children who get inactivated flu vaccine and pneumococcal vaccine (PCV13) at the same time may be at increased risk for seizures caused by fever. Ask your doctor for more information. Tell your doctor if a child who is getting flu vaccine has ever had a seizure. Inactivated flu vaccine does not contain live flu virus, so you cannot get the flu from this vaccine. As with any medicine, there is a very remote chance of a vaccine causing a serious injury or death. The safety of vaccines is always being monitored. For  more information, visit: http://floyd.org/ 5. What if there is a serious reaction? What should I look for?  Look for anything that concerns you, such as signs of a severe allergic reaction, very high fever, or behavior changes. Signs of a severe allergic reaction can include hives, swelling of the face and throat, difficulty breathing, a fast heartbeat, dizziness, and weakness. These would start a few minutes to a few hours after the vaccination. What should I do?  If you think it is a severe allergic reaction or other emergency that can't wait, call 9-1-1 and get the person to the nearest hospital. Otherwise, call your doctor.  Afterward, the reaction should be reported to the Vaccine Adverse Event Reporting System (VAERS). Your doctor should file this report,  or you can do it yourself through the VAERS web site at www.vaers.LAgents.nohhs.gov, or by calling 1-217-832-2909. VAERS does not give medical advice. 6. The National Vaccine Injury Compensation Program The [employer] Vaccine Injury Compensation Program (VICP) is a federal program that was created to compensate people who may have been injured by certain vaccines. Persons who believe they may have been injured by a vaccine can learn about the program and about filing a claim by calling 1-920 060 5257 or visiting the VICP website at SpiritualWord.atwww.hrsa.gov/vaccinecompensation. There is a time limit to file a claim for compensation. 7. How can I learn more?  Ask your health care provider.  Call your local or state health department.  Contact the Centers for Disease Control and Prevention (CDC):  Call 262 653 96091-660-280-6848 (1-800-CDC-INFO) or  Visit CDC's website at BiotechRoom.com.cywww.cdc.gov/flu CDC Vaccine Information Statement (Interim) Inactivated Influenza Vaccine (02/04/2013) Document Released: 03/30/2006 Document Revised: 10/20/2013 Document Reviewed: 05/23/2013 [facility] Patient Information 2015 [place], [employer]. This information is not intended to replace advice  given to you by your health care provider. Make sure you discuss any questions you have with your health care provider.

## 2014-05-31 ENCOUNTER — Ambulatory Visit (INDEPENDENT_AMBULATORY_CARE_PROVIDER_SITE_OTHER): Payer: Self-pay | Admitting: Emergency Medicine

## 2014-05-31 VITALS — BP 134/82 | HR 81 | Temp 98.2°F | Resp 16 | Ht 66.5 in | Wt 197.0 lb

## 2014-05-31 DIAGNOSIS — J209 Acute bronchitis, unspecified: Secondary | ICD-10-CM

## 2014-05-31 DIAGNOSIS — N528 Other male erectile dysfunction: Secondary | ICD-10-CM

## 2014-05-31 MED ORDER — SILDENAFIL CITRATE 100 MG PO TABS: 50.0000 mg | ORAL_TABLET | Freq: Every day | ORAL | Status: DC | PRN

## 2014-05-31 MED ORDER — AZITHROMYCIN 250 MG PO TABS: ORAL_TABLET | ORAL | Status: DC

## 2014-05-31 MED ORDER — HYDROCODONE-HOMATROPINE 5-1.5 MG/5ML PO SYRP: ORAL_SOLUTION | ORAL | Status: DC

## 2014-05-31 MED ORDER — BENZONATATE 100 MG PO CAPS: 100.0000 mg | ORAL_CAPSULE | Freq: Three times a day (TID) | ORAL | Status: DC | PRN

## 2014-05-31 NOTE — Progress Notes (Signed)
Subjective:  This chart was scribed for Colin ChrisSteven Gisel Vipond, MD by Colin Pena, Medial Scribe. This patient was seen in room 11 and the patient's care was started at 1:24 PM.  Chief Complaint  Patient presents with  . Nasal Congestion    Sinus Pressure & Drainage  . Wheezing  . Cough    with Green Phelgm  . Medication Management    Wants Rx for Viagra     Patient ID: Colin Pena, male    DOB: 1955-08-08, 58 y.o.   MRN: [id number]  HPI HPI Comments: Colin Pena is a 58 y.o. male who presents to the [facility] complaining of cold symptoms including sneezing , chest congestion, cough and post nasal drip onset 1.5 weeks ago. Patient reports that at night he feels that he is wheezing when he is lying down. Patient states that he hear audible wheezing when lying on his right side, but now when lying supine. Patient reports treatment with Robitussin and Mucinex without relief. Patient reports remote history of pnuemonia and bronchitis in childhood, but not as an adult. Patient reports similar symptoms within the last few years. Patient denies historical use of inhaler, but reports prescribed nasal decongestant spray during his last episode. Patient denies sick contacts at home.  Patient mentions a desire for prescription for Viagra stating that he is having difficulty sustaining erection.    Past Medical History  Diagnosis Date  . Hypertension    History reviewed. No pertinent past surgical history. Allergies  Allergen Reactions  . Codeine Nausea And Vomiting   Prior to Admission medications   Medication Sig Start Date End Date Taking? Authorizing Provider  amLODipine (NORVASC) 5 MG tablet Take one daily for blood pressure 04/29/14  Yes Peyton Najjaravid H Hopper, MD  clobetasol cream (TEMOVATE) 0.05 % Apply 1 application topically 2 (two) times daily. 04/29/14  Yes Peyton Najjaravid H Hopper, MD  lisinopril-hydrochlorothiazide [facility](PRINZIDE,ZESTORETIC) 20-25 MG per tablet Take one  daily for blood pressure 04/29/14  Yes Peyton Najjaravid H Hopper, MD   History   Social History  . Marital Status: Married    Spouse Name: N/A    Number of Children: N/A  . Years of Education: N/A   Occupational History  . Not on file.   Social History Main Topics  . Smoking status: Former Games developermoker  . Smokeless tobacco: Never Used  . Alcohol Use: No  . Drug Use: No  . Sexual Activity: Not on file   Other Topics Concern  . Not on file   Social History Narrative    Review of Systems  Constitutional: Negative for fever and chills.  HENT: Positive for congestion, rhinorrhea and sneezing.   Respiratory: Positive for wheezing.   Gastrointestinal: Negative for nausea, vomiting and diarrhea.       Objective:   Physical Exam  Nursing note and vitals reviewed.   CONSTITUTIONAL: Well developed/well nourished HEAD: Normocephalic/atraumatic EYES: EOMI/PERRL ENMT: Mucous membranes moist; nasal congestion,  NECK: supple no meningeal signs SPINE/BACK:entire spine nontender CV: S1/S2 noted, no murmurs/rubs/gallops noted, bilateral rhonchi with good air exchange; no areas of dullness LUNGS: Lungs are clear to auscultation bilaterally, no apparent distress ABDOMEN: soft, nontender, no rebound or guarding, bowel sounds noted throughout abdomen GU:no cva tenderness NEURO: Pt is awake/alert/appropriate, moves all extremitiesx4.  No facial droop.   EXTREMITIES: pulses normal/equal, full ROM SKIN: warm, color normal PSYCH: no abnormalities of mood noted, alert and oriented to situation  Filed Vitals:   05/31/14 1252  BP:  134/82  Pulse: 81  Temp: 98.2 F (36.8 C)  TempSrc: Oral  Resp: 16  Height: 5' 6.5" (1.689 m)  Weight: 197 lb (89.359 kg)  SpO2: 99%   A and PLAN     patient given Viagra for ED. He will be treated with Tessalon Perles and Zithromax and Hycodan for his bronchitis. I personally performed the services described in this documentation, which was scribed in my presence. The  recorded information has been reviewed and is accurate.

## 2014-05-31 NOTE — Patient Instructions (Signed)
Erectile Dysfunction Erectile dysfunction is the inability to get or sustain a good enough erection to have sexual intercourse. Erectile dysfunction may involve:  Inability to get an erection.  Lack of enough hardness to allow penetration.  Loss of the erection before sex is finished.  Premature ejaculation. CAUSES  Certain drugs, such as:  Pain relievers.  Antihistamines.  Antidepressants.  Blood pressure medicines.  Water pills (diuretics).  Ulcer medicines.  Muscle relaxants.  Illegal drugs.  Excessive drinking.  Psychological causes, such as:  Anxiety.  Depression.  Sadness.  Exhaustion.  Performance fear.  Stress.  Physical causes, such as:  Artery problems. This may include diabetes, smoking, liver disease, or atherosclerosis.  High blood pressure.  Hormonal problems, such as low testosterone.  Obesity.  Nerve problems. This may include back or pelvic injuries, diabetes mellitus, multiple sclerosis, or Parkinson disease. SYMPTOMS  Inability to get an erection.  Lack of enough hardness to allow penetration.  Loss of the erection before sex is finished.  Premature ejaculation.  Normal erections at some times, but with frequent unsatisfactory episodes.  Orgasms that are not satisfactory in sensation or frequency.  Low sexual satisfaction in either partner because of erection problems.  A curved penis occurring with erection. The curve may cause pain or may be too curved to allow for intercourse.  Never having nighttime erections. DIAGNOSIS Your caregiver can often diagnose this condition by:  Performing a physical exam to find other diseases or specific problems with the penis.  Asking you detailed questions about the problem.  Performing blood tests to check for diabetes mellitus or to measure hormone levels.  Performing urine tests to find other underlying health conditions.  Performing an ultrasound exam to check for  scarring.  Performing a test to check blood flow to the penis.  Doing a sleep study at home to measure nighttime erections. TREATMENT   You may be prescribed medicines by mouth.  You may be given medicine injections into the penis.  You may be prescribed a vacuum pump with a ring.  Penile implant surgery may be performed. You may receive:  An inflatable implant.  A semirigid implant.  Blood vessel surgery may be performed. HOME CARE INSTRUCTIONS  If you are prescribed oral medicine, you should take the medicine as prescribed. Do not increase the dosage without first discussing it with your physician.  If you are using self-injections, be careful to avoid any veins that are on the surface of the penis. Apply pressure to the injection site for 5 minutes.  If you are using a vacuum pump, make sure you have read the instructions before using it. Discuss any questions with your physician before taking the pump home. SEEK MEDICAL CARE IF:  You experience pain that is not responsive to the pain medicine you have been prescribed.  You experience nausea or vomiting. SEEK IMMEDIATE MEDICAL CARE IF:   When taking oral or injectable medications, you experience an erection that lasts longer than 4 hours. If your physician is unavailable, go to the nearest emergency room for evaluation. An erection that lasts much longer than 4 hours can result in permanent damage to your penis.  You have pain that is severe.  You develop redness, severe pain, or severe swelling of your penis.  You have redness spreading up into your groin or lower abdomen.  You are unable to pass your urine. Document Released: 06/02/2000 Document Revised: 02/05/2013 Document Reviewed: 11/07/2012 ExitCare Patient Information 2015 [employer]. This information is not   intended to replace advice given to you by your health care provider. Make sure you discuss any questions you have with your health care  provider. Acute Bronchitis Bronchitis is inflammation of the airways that extend from the windpipe into the lungs (bronchi). The inflammation often causes mucus to develop. This leads to a cough, which is the most common symptom of bronchitis.  In acute bronchitis, the condition usually develops suddenly and goes away over time, usually in a couple weeks. Smoking, allergies, and asthma can make bronchitis worse. Repeated episodes of bronchitis may cause further lung problems.  CAUSES Acute bronchitis is most often caused by the same virus that causes a cold. The virus can spread from person to person (contagious) through coughing, sneezing, and touching contaminated objects. SIGNS AND SYMPTOMS   Cough.   Fever.   Coughing up mucus.   Body aches.   Chest congestion.   Chills.   Shortness of breath.   Sore throat.  DIAGNOSIS  Acute bronchitis is usually diagnosed through a physical exam. Your health care provider will also ask you questions about your medical history. Tests, such as chest X-rays, are sometimes done to rule out other conditions.  TREATMENT  Acute bronchitis usually goes away in a couple weeks. Oftentimes, no medical treatment is necessary. Medicines are sometimes given for relief of fever or cough. Antibiotic medicines are usually not needed but may be prescribed in certain situations. In some cases, an inhaler may be recommended to help reduce shortness of breath and control the cough. A cool mist vaporizer may also be used to help thin bronchial secretions and make it easier to clear the chest.  HOME CARE INSTRUCTIONS  Get plenty of rest.   Drink enough fluids to keep your urine clear or pale yellow (unless you have a medical condition that requires fluid restriction). Increasing fluids may help thin your respiratory secretions (sputum) and reduce chest congestion, and it will prevent dehydration.   Take medicines only as directed by your health care  provider.  If you were prescribed an antibiotic medicine, finish it all even if you start to feel better.  Avoid smoking and secondhand smoke. Exposure to cigarette smoke or irritating chemicals will make bronchitis worse. If you are a smoker, consider using nicotine gum or skin patches to help control withdrawal symptoms. Quitting smoking will help your lungs heal faster.   Reduce the chances of another bout of acute bronchitis by washing your hands frequently, avoiding people with cold symptoms, and trying not to touch your hands to your mouth, nose, or eyes.   Keep all follow-up visits as directed by your health care provider.  SEEK MEDICAL CARE IF: Your symptoms do not improve after 1 week of treatment.  SEEK IMMEDIATE MEDICAL CARE IF:  You develop an increased fever or chills.   You have chest pain.   You have severe shortness of breath.  You have bloody sputum.   You develop dehydration.  You faint or repeatedly feel like you are going to pass out.  You develop repeated vomiting.  You develop a severe headache. MAKE SURE YOU:   Understand these instructions.  Will watch your condition.  Will get help right away if you are not doing well or get worse. Document Released: 07/13/2004 Document Revised: 10/20/2013 Document Reviewed: 11/26/2012 [facility] Patient Information 2015 [place] [place]. This information is not intended to replace advice given to you by your health care provider. Make sure you discuss any questions you have with your health  care provider.  

## 2014-10-23 ENCOUNTER — Ambulatory Visit (INDEPENDENT_AMBULATORY_CARE_PROVIDER_SITE_OTHER): Payer: Self-pay | Admitting: Emergency Medicine

## 2014-10-23 VITALS — BP 124/80 | HR 124 | Temp 98.6°F | Resp 17 | Ht 66.5 in | Wt 207.0 lb

## 2014-10-23 DIAGNOSIS — J0140 Acute pansinusitis, unspecified: Secondary | ICD-10-CM

## 2014-10-23 DIAGNOSIS — J209 Acute bronchitis, unspecified: Secondary | ICD-10-CM

## 2014-10-23 MED ORDER — PSEUDOEPHEDRINE-GUAIFENESIN ER 60-600 MG PO TB12: 1.0000 | ORAL_TABLET | Freq: Two times a day (BID) | ORAL | Status: DC

## 2014-10-23 MED ORDER — AMOXICILLIN-POT CLAVULANATE 875-125 MG PO TABS: 1.0000 | ORAL_TABLET | Freq: Two times a day (BID) | ORAL | Status: DC

## 2014-10-23 MED ORDER — HYDROCOD POLST-CPM POLST ER 10-8 MG/5ML PO SUER: 5.0000 mL | Freq: Two times a day (BID) | ORAL | Status: DC

## 2014-10-23 NOTE — Progress Notes (Signed)
[facility] [address], [place] [id number] [phone]  Date:  10/23/2014   Name:  Colin HeathStephen Perry Kunde   DOB:  06-30-1955   MRN:  [id number]  PCP:  No PCP Per Patient    Chief Complaint: Sinusitis   History of Present Illness:  Colin Pena is a 59 y.o. very pleasant male patient who presents with the following:  Patient has nasal congestion and mucoid nasal drainage for months Has post nasal drip that is mucopurulent and has a foul smell.  Cough worse at night with largely mucoid sputum No wheezing or shortness of breath No nausea or vomiting or stool change No rash Thinks he had a fever but never took his temperature No improvement with over the counter medications or other home remedies.  Denies other complaint or health concern today.   Patient Active Problem List   Diagnosis Date Noted  . Hypertension 05/09/2012    Past Medical History  Diagnosis Date  . Hypertension     No past surgical history on file.  History  Substance Use Topics  . Smoking status: Former Games developermoker  . Smokeless tobacco: Never Used  . Alcohol Use: No    Family History  Problem Relation Age of Onset  . Cancer Father   . Hypertension Sister   . Cancer Maternal Aunt   . Heart disease Paternal Uncle   . Cancer Maternal Uncle   . Cancer Maternal Uncle   . Cancer Paternal Uncle   . Cancer Paternal Uncle     Allergies  Allergen Reactions  . Codeine Nausea And Vomiting    Medication list has been reviewed and updated.  Current Outpatient Prescriptions on File Prior to Visit  Medication Sig Dispense Refill  . amLODipine (NORVASC) 5 MG tablet Take one daily for blood pressure 90 tablet 3  . lisinopril-hydrochlorothiazide (PRINZIDE,ZESTORETIC) 20-25 MG per tablet Take one daily for blood pressure 90 tablet 3  . sildenafil (VIAGRA) 100 MG tablet Take 0.5-1 tablets (50-100 mg total) by mouth daily as needed for erectile dysfunction. 5 tablet 11  .  azithromycin (ZITHROMAX) 250 MG tablet Take 2 tabs PO x 1 dose, then 1 tab PO QD x 4 days (Patient not taking: Reported on 10/23/2014) 6 tablet 0  . HYDROcodone-homatropine (HYCODAN) 5-1.5 MG/5ML syrup 1 teaspoon at night for cough (Patient not taking: Reported on 10/23/2014) 120 mL 0   No current facility-administered medications on file prior to visit.    Review of Systems:  Review of Systems  Constitutional: Negative for fever, chills and fatigue.  HENT: Negative for congestion, ear pain, hearing loss, postnasal drip, rhinorrhea and sinus pressure.   Eyes: Negative for discharge and redness.  Respiratory: Negative for cough, shortness of breath and wheezing.   Cardiovascular: Negative for chest pain and leg swelling.  Gastrointestinal: Negative for nausea, vomiting, abdominal pain, constipation and blood in stool.  Genitourinary: Negative for dysuria, urgency and frequency.  Musculoskeletal: Negative for neck stiffness.  Skin: Negative for rash.  Neurological: Negative for seizures, weakness and headaches.   Physical Examination: Filed Vitals:   10/23/14 1309  BP: 124/80  Pulse: 124  Temp: 98.6 F (37 C)  Resp: 17   Filed Vitals:   10/23/14 1309  Height: 5' 6.5" (1.689 m)  Weight: 207 lb (93.895 kg)   Body mass index is 32.91 kg/(m^2). Ideal Body Weight: Weight in (lb) to have BMI = 25: 156.9    GEN: WDWN, NAD, Non-toxic, A & O  x 3  Frequent cough HEENT: Atraumatic, Normocephalic. Neck supple. No masses, No LAD. Ears and Nose: No external deformity.  Purulent nasal drainage CV: RRR, No M/G/R. No JVD. No thrill. No extra heart sounds. PULM: CTA B, no wheezes, crackles, rhonchi. No retractions. No resp. distress. No accessory muscle use. ABD: S, NT, ND, +BS. No rebound. No HSM. EXTR: No c/c/e NEURO Normal gait.  PSYCH: Normally interactive. Conversant. Not depressed or anxious appearing.  Calm demeanor.    Assessment and  Plan: Sinusitis Bronchitis augmentin mucinex tussionex    Signed Phillips OdorJeffery Joee Iovine, MD

## 2014-10-23 NOTE — Patient Instructions (Signed)

## 2015-03-08 ENCOUNTER — Ambulatory Visit (INDEPENDENT_AMBULATORY_CARE_PROVIDER_SITE_OTHER): Payer: Self-pay | Admitting: Physician Assistant

## 2015-03-08 VITALS — BP 148/84 | HR 85 | Temp 98.4°F | Resp 18 | Ht 66.0 in | Wt 208.0 lb

## 2015-03-08 DIAGNOSIS — I10 Essential (primary) hypertension: Secondary | ICD-10-CM

## 2015-03-08 DIAGNOSIS — R5383 Other fatigue: Secondary | ICD-10-CM

## 2015-03-08 DIAGNOSIS — Z13220 Encounter for screening for lipoid disorders: Secondary | ICD-10-CM

## 2015-03-08 DIAGNOSIS — L309 Dermatitis, unspecified: Secondary | ICD-10-CM

## 2015-03-08 LAB — POCT CBC
Granulocyte percent: 71.2 %G (ref 37–80)
HCT, POC: 43.3 % — AB (ref 43.5–53.7)
Hemoglobin: 14.5 g/dL (ref 14.1–18.1)
LYMPH, POC: 1 (ref 0.6–3.4)
MCH, POC: 28.3 pg (ref 27–31.2)
MCHC: 33.4 g/dL (ref 31.8–35.4)
MCV: 84.7 fL (ref 80–97)
MID (CBC): 0.4 (ref 0–0.9)
MPV: 7.2 fL (ref 0–99.8)
POC Granulocyte: 3.6 (ref 2–6.9)
POC LYMPH %: 20.6 % (ref 10–50)
POC MID %: 8.2 % (ref 0–12)
Platelet Count, POC: 257 10*3/uL (ref 142–424)
RBC: 5.12 M/uL (ref 4.69–6.13)
RDW, POC: 13 %
WBC: 5 10*3/uL (ref 4.6–10.2)

## 2015-03-08 LAB — POCT URINALYSIS DIPSTICK
Bilirubin, UA: NEGATIVE
Blood, UA: NEGATIVE
GLUCOSE UA: NEGATIVE
Ketones, UA: NEGATIVE
LEUKOCYTES UA: NEGATIVE
NITRITE UA: NEGATIVE
Protein, UA: NEGATIVE
Spec Grav, UA: 1.025
UROBILINOGEN UA: 0.2
pH, UA: 5.5

## 2015-03-08 LAB — COMPREHENSIVE METABOLIC PANEL
ALBUMIN: 4.5 g/dL (ref 3.6–5.1)
ALT: 34 U/L (ref 9–46)
AST: 25 U/L (ref 10–35)
Alkaline Phosphatase: 69 U/L (ref 40–115)
BILIRUBIN TOTAL: 0.6 mg/dL (ref 0.2–1.2)
BUN: 13 mg/dL (ref 7–25)
CO2: 26 mmol/L (ref 20–31)
CREATININE: 1.03 mg/dL (ref 0.70–1.33)
Calcium: 8.9 mg/dL (ref 8.6–10.3)
Chloride: 104 mmol/L (ref 98–110)
Glucose, Bld: 98 mg/dL (ref 65–99)
Potassium: 4.1 mmol/L (ref 3.5–5.3)
Sodium: 136 mmol/L (ref 135–146)
TOTAL PROTEIN: 7 g/dL (ref 6.1–8.1)

## 2015-03-08 LAB — LIPID PANEL
CHOLESTEROL: 155 mg/dL (ref 125–200)
HDL: 35 mg/dL — ABNORMAL LOW (ref >=40)
LDL Cholesterol: 103 mg/dL (ref <130)
TRIGLYCERIDES: 87 mg/dL (ref <150)
Total CHOL/HDL Ratio: 4.4 Ratio (ref <=5.0)
VLDL: 17 mg/dL (ref <30)

## 2015-03-08 LAB — TSH: TSH: 1.01 u[IU]/mL (ref 0.350–4.500)

## 2015-03-08 LAB — POCT GLYCOSYLATED HEMOGLOBIN (HGB A1C): Hemoglobin A1C: 5.7

## 2015-03-08 MED ORDER — LISINOPRIL-HYDROCHLOROTHIAZIDE 20-12.5 MG PO TABS: 1.0000 | ORAL_TABLET | Freq: Every day | ORAL | Status: DC

## 2015-03-08 MED ORDER — TRIAMCINOLONE ACETONIDE 0.1 % EX CREA: 1.0000 "application " | TOPICAL_CREAM | Freq: Two times a day (BID) | CUTANEOUS | Status: DC

## 2015-03-08 MED ORDER — AMLODIPINE BESYLATE 10 MG PO TABS: 10.0000 mg | ORAL_TABLET | Freq: Every day | ORAL | Status: DC

## 2015-03-08 NOTE — Patient Instructions (Signed)
Start taking new doses of medication in the morning. If you are still getting dizzy in a few months, return for re-evaluation. Keep taking your BP and keep a record -- should be <140/90 Work on diet and exercise to reduce your risk of progressing to diabetes. See mediterranean diet below. This is good for diabetes and heart health. Generally avoid white foods (bread, pasta, rice, potatoes), sweets and sugary beverages. Limit yourself to two-three donuts a week. Start taking a baby aspirin daily - 81 mg. I will call you with your lab results. Return in 6 months for follow up.

## 2015-03-08 NOTE — Progress Notes (Signed)
[facility] [address], [place] [address] [phone]  Date:  03/08/2015   Name:  Colin Pena   DOB:  02-Jul-1955   MRN:  [id number]  PCP:  No PCP Per Patient    Chief Complaint: Diabetes and Fatigue   History of Present Illness:  This is a 59 y.o. male with PMH HTN and eczema who is presenting wanting to be tested for DM. He wants this done because he is more fatigued over the past 4 months. Mother had DM and he thinks his test had CAD and heart surgeries. Has not had any polydipsia or polyuria. Occ gets mild dysuria but thinks this is from his excess caffeine intake. Drinks 4-5 cups of coffee a day. No hematuria. He states he does eat a lot of potatoes and has 2 donuts a day. He drinks coffee, water and diet sodas. He does not exercise regularly.  HTN: take zestoretic 20-25 and amlodipine 5 mg. States he has done well with amlodipine but zestoretic usually makes him feel dizzy. He has been on this for several years and has never gotten used to it. He has a BP monitor at home and checks regularly. States usually 150-155/80s. He denies CP, headache, SOB, LE edema, blurred vision.  Pt does not have insurance and states he has not had baseline blood work in 8-9 years. He is ok with doing today.  Pt also wanting new rx for steroid cream for his hand dermatitis which has been more of a problem for him over past 3 years. He used to use clobetasol but this has become too expensive.  Review of Systems:  Review of Systems See HPI  Patient Active Problem List   Diagnosis Date Noted  . Hypertension 05/09/2012    Prior to Admission medications   Medication Sig Start Date End Date Taking? Authorizing Provider  amLODipine (NORVASC) 5 MG tablet Take one daily for blood pressure 04/29/14  Yes Peyton Najjar, MD  lisinopril-hydrochlorothiazide [facility]) 20-25 MG per tablet Take one daily for blood pressure 04/29/14  Yes Peyton Najjar, MD  sildenafil  (VIAGRA) 100 MG tablet Take 0.5-1 tablets (50-100 mg total) by mouth daily as needed for erectile dysfunction. 05/31/14  Yes Collene Gobble, MD  pseudoephedrine-guaifenesin (MUCINEX D) 60-600 MG per tablet Take 1 tablet by mouth every 12 (twelve) hours. 10/23/14 10/23/15  Carmelina Dane, MD    Allergies  Allergen Reactions  . Codeine Nausea And Vomiting    History reviewed. No pertinent past surgical history.  Social History  Substance Use Topics  . Smoking status: Former Games developer  . Smokeless tobacco: Never Used  . Alcohol Use: No    Family History  Problem Relation Age of Onset  . Cancer Father   . Hypertension Sister   . Cancer Maternal Aunt   . Heart disease Paternal Uncle   . Cancer Maternal Uncle   . Cancer Maternal Uncle   . Cancer Paternal Uncle   . Cancer Paternal Uncle     Medication list has been reviewed and updated.  Physical Examination:  Physical Exam  Constitutional: He is oriented to person, place, and time. He appears well-developed and well-nourished. No distress.  HENT:  Head: Normocephalic and atraumatic.  Right Ear: Hearing normal.  Left Ear: Hearing normal.  Nose: Nose normal.  Eyes: Conjunctivae and lids are normal. Right eye exhibits no discharge. Left eye exhibits no discharge. No scleral icterus.  Neck: Trachea normal. Carotid bruit is  not present. No thyromegaly present.  Cardiovascular: Normal rate, regular rhythm, normal heart sounds and normal pulses.   No murmur heard. Pulmonary/Chest: Effort normal and breath sounds normal. No respiratory distress. He has no wheezes. He has no rhonchi. He has no rales.  Musculoskeletal: Normal range of motion.  Lymphadenopathy:       Head (right side): No submental, no submandibular and no tonsillar adenopathy present.       Head (left side): No submental, no submandibular and no tonsillar adenopathy present.    He has no cervical adenopathy.  Neurological: He is alert and oriented to person, place, and  time.  Skin: Skin is warm and dry.  No LE edema Bilateral palms with dry skin patches and fissures  Psychiatric: He has a normal mood and affect. His speech is normal and behavior is normal. Thought content normal.   BP 148/84 mmHg  Pulse 85  Temp(Src) 98.4 F (36.9 C) (Oral)  Resp 18  Ht  (1.676 m)  Wt 208 lb (94.348 kg)  BMI 33.59 kg/m2  SpO2 98%  Results for orders placed or performed in visit on 03/08/15  POCT CBC  Result Value Ref Range   WBC 5.0 4.6 - 10.2 K/uL   Lymph, poc 1.0 0.6 - 3.4   POC LYMPH PERCENT 20.6 10 - 50 %L   MID (cbc) 0.4 0 - 0.9   POC MID % 8.2 0 - 12 %M   POC Granulocyte 3.6 2 - 6.9   Granulocyte percent 71.2 37 - 80 %G   RBC 5.12 4.69 - 6.13 M/uL   Hemoglobin 14.5 14.1 - 18.1 g/dL   HCT, POC 78.2 (A) 95.6 - 53.7 %   MCV 84.7 80 - 97 fL   MCH, POC 28.3 27 - 31.2 pg   MCHC 33.4 31.8 - 35.4 g/dL   RDW, POC 21.3 %   Platelet Count, POC 257 142 - 424 K/uL   MPV 7.2 0 - 99.8 fL  POCT glycosylated hemoglobin (Hb A1C)  Result Value Ref Range   Hemoglobin A1C 5.7   POCT urinalysis dipstick  Result Value Ref Range   Color, UA yellow    Clarity, UA clear    Glucose, UA neg    Bilirubin, UA neg    Ketones, UA neg    Spec Grav, UA 1.025    Blood, UA neg    pH, UA 5.5    Protein, UA neg    Urobilinogen, UA 0.2    Nitrite, UA neg    Leukocytes, UA Negative Negative   Assessment and Plan:  1. Other fatigue UA normal, he is not anemic, A1C 5.7. TSH pending. May need to consider sleep study if everything normal although I suspect he will not want to do this until he gets insurance. - POCT CBC - POCT glycosylated hemoglobin (Hb A1C) - POCT urinalysis dipstick - TSH  2. Essential hypertension HTN not controlled on current regimen. In addition pt is frequently dizzy with the zestoretic and has not gotten used to it despite taking daily for several years. Will increase amlodipine to 10 mg and decreased HCTZ to 12.5 and see if there is an  improvement in BP/sx with this change. Continue daily monitoring. CMP pending. Follow up 6 months. - Comprehensive metabolic panel - lisinopril-hydrochlorothiazide (ZESTORETIC) 20-12.5 MG per tablet; Take 1 tablet by mouth daily.  Dispense: 90 tablet; Refill: 3 - amLODipine (NORVASC) 10 MG tablet; Take 1 tablet (10 mg total) by mouth daily.  Dispense: 90 tablet; Refill: 3  3. Lipid screening Lipid panel pending. Advised starting daily baby aspirin. - Lipid panel  4. Eczema of both hands Will prescribe triamcinolone to see if cheaper for pt. If not able to afford, advised hydrocortisone OTC and aquaphor.  - triamcinolone cream (KENALOG) 0.1 %; Apply 1 application topically 2 (two) times daily.  Dispense: 30 g; Refill: 0   Roswell Miners. Dyke Brackett, MHS [facility]  03/08/2015

## 2015-04-14 ENCOUNTER — Ambulatory Visit (INDEPENDENT_AMBULATORY_CARE_PROVIDER_SITE_OTHER): Payer: Self-pay | Admitting: Family Medicine

## 2015-04-14 VITALS — BP 130/80 | HR 97 | Temp 98.2°F | Resp 20 | Ht 66.0 in | Wt 207.6 lb

## 2015-04-14 DIAGNOSIS — R21 Rash and other nonspecific skin eruption: Secondary | ICD-10-CM

## 2015-04-14 DIAGNOSIS — Z23 Encounter for immunization: Secondary | ICD-10-CM

## 2015-04-14 MED ORDER — CLOBETASOL PROPIONATE 0.05 % EX CREA: 1.0000 "application " | TOPICAL_CREAM | Freq: Two times a day (BID) | CUTANEOUS | Status: DC

## 2015-04-14 NOTE — Progress Notes (Signed)
Subjective:     [name] Wale is a 59 y.o. male who presents for evaluation of a rash involving the chest and trunk. Rash started 3 months ago. Lesions are erythematous/indurated, and raised in texture. Rash has not changed over time. Rash is pruritic. Associated symptoms: none. Patient denies: abdominal pain, arthralgia, fever, headache and myalgia. Patient has not had contacts with similar rash. Patient has not had new exposures (soaps, lotions, laundry detergents, foods, medications, plants, insects or animals).  Has tried HTC OTC cream w/o relief.   The following portions of the patient's history were reviewed and updated as appropriate: allergies, current medications, past family history, past medical history, past social history, past surgical history and problem list.  Review of Systems Pertinent items are noted in HPI.    Objective:    BP 130/80 mmHg  Pulse 97  Temp(Src) 98.2 F (36.8 C) (Oral)  Resp 20  Ht 5\' 6"  (1.676 m)  Wt 207 lb 9.6 oz (94.167 kg)  BMI 33.52 kg/m2  SpO2 98% General:  alert, cooperative and appears stated age  Skin:  0.5 by 0.5 indurated, erythematous papule R axillary region.  0.25 x 0.25 erythematous papule L axillary region     Assessment:    contact dermatitis: unsure etiology    Plan:    Unsure what is causing this but appears to be contact dermatitis.  WIll use trial of clobetasol cream x 2 weeks.  If no improvement, would consider bx of the R axillary region.

## 2015-04-14 NOTE — Patient Instructions (Signed)
Clobetasol Propionate skin cream What is this medicine? CLOBETASOL (kloe BAY ta sol) is a corticosteroid. It is used on the skin to treat itching, redness, and swelling caused by some skin conditions. This medicine may be used for other purposes; ask your health care provider or pharmacist if you have questions. What should I tell my health care provider before I take this medicine? They need to know if you have any of these conditions: -any type of active infection including measles, tuberculosis, herpes, or chickenpox -circulation problems or vascular disease -large areas of burned or damaged skin -rosacea -skin wasting or thinning -an unusual or allergic reaction to clobetasol, corticosteroids, other medicines, foods, dyes, or preservatives -pregnant or trying to get pregnant -breast-feeding How should I use this medicine? This medicine is for external use only. Do not take by mouth. Follow the directions on the prescription label. Wash your hands before and after use. Apply a thin film of medicine to the affected area. Do not cover with a bandage or dressing unless your doctor or health care professional tells you to. Do not get this medicine in your eyes. If you do, rinse out with plenty of cool tap water. It is important not to use more medicine than prescribed. Do not use your medicine more often than directed. To do so may increase the chance of side effects. Talk to your pediatrician regarding the use of this medicine in children. Special care may be needed. Elderly patients are more likely to have damaged skin through aging, and this may increase side effects. This medicine should only be used for brief periods and infrequently in older patients. Overdosage: If you think you have taken too much of this medicine contact a poison control center or emergency room at once. NOTE: This medicine is only for you. Do not share this medicine with others. What if I miss a dose? If you miss a  dose, use it as soon as you can. If it is almost time for your next dose, use only that dose. Do not use double or extra doses. What may interact with this medicine? Interactions are not expected. Do not use cosmetics or other skin care products on the treated area. This list may not describe all possible interactions. Give your health care provider a list of all the medicines, herbs, non-prescription drugs, or dietary supplements you use. Also tell them if you smoke, drink alcohol, or use illegal drugs. Some items may interact with your medicine. What should I watch for while using this medicine? Tell your doctor or health care professional if your symptoms do not get better within 2 weeks, or if you develop skin irritation from the medicine. Tell your doctor or health care professional if you are exposed to anyone with measles or chickenpox, or if you develop sores or blisters that do not heal properly. What side effects may I notice from receiving this medicine? Side effects that you should report to your doctor or health care professional as soon as possible: -allergic reactions like skin rash, itching or hives, swelling of the face, lips, or tongue -changes in vision -lack of healing of the skin condition -painful, red, pus filled blisters on the skin or in hair follicles -thinning of the skin with easy bruising Side effects that usually do not require medical attention (report to your doctor or health care professional if they continue or are bothersome): -burning, irritation of the skin -redness or scaling of the skin This list may not describe all   possible side effects. Call your doctor for medical advice about side effects. You may report side effects to FDA at 1-800-FDA-1088. Where should I keep my medicine? Keep out of the reach of children. Store at room temperature between 15 and 30 degrees C (59 and 86 degrees F). Keep away from heat and direct light. Do not freeze. Throw away any  unused medicine after the expiration date. NOTE: This sheet is a summary. It may not cover all possible information. If you have questions about this medicine, talk to your doctor, pharmacist, or health care provider.    2016, Elsevier/Gold Standard. (2007-09-11 16:56:45)  

## 2015-05-16 NOTE — Progress Notes (Signed)
History and physical examinations obtained with Dr. Hess. Agree with assessment and plan. Pailynn Vahey Martin Savreen Gebhardt, M.D. [facility]  [address] [place], [place]  [address] [phone] phone [phone] fax  

## 2015-05-30 ENCOUNTER — Ambulatory Visit (INDEPENDENT_AMBULATORY_CARE_PROVIDER_SITE_OTHER): Payer: Self-pay | Admitting: Urgent Care

## 2015-05-30 VITALS — BP 134/74 | HR 108 | Temp 98.1°F | Resp 16 | Ht 66.0 in | Wt 212.0 lb

## 2015-05-30 DIAGNOSIS — R21 Rash and other nonspecific skin eruption: Secondary | ICD-10-CM

## 2015-05-30 DIAGNOSIS — L299 Pruritus, unspecified: Secondary | ICD-10-CM

## 2015-05-30 DIAGNOSIS — L309 Dermatitis, unspecified: Secondary | ICD-10-CM

## 2015-05-30 LAB — POCT SKIN KOH: Skin KOH, POC: NEGATIVE

## 2015-05-30 MED ORDER — TRIAMCINOLONE 0.1 % CREAM:EUCERIN CREAM 1:1: 1.0000 "application " | TOPICAL_CREAM | Freq: Two times a day (BID) | CUTANEOUS | Status: AC

## 2015-05-30 MED ORDER — FLUCONAZOLE 200 MG PO TABS: 200.0000 mg | ORAL_TABLET | ORAL | Status: DC

## 2015-05-30 NOTE — Progress Notes (Signed)
    MRN: [id number] DOB: 01-12-1956  Subjective:   Colin HeathStephen Perry Pena is a 59 y.o. male presenting for chief complaint of Rash  Reports 4 week history of persistent rash. The rash initially started over his thighs and has since spread to his torso and arms. It is generally confined to small areas and is very itchy. Has tried Benadryl and Cortisone with relief of itching. Denies tick bites, insect bites, coming into contact with poisonous plants, spending significant time outdoors, starting new medications, new cleaning agents or laundry detergents. He has a history of eczema but states that this feels different.  Colin Pena has a current medication list which includes the following prescription(s): amlodipine, lisinopril-hydrochlorothiazide, and sildenafil. Also has no active allergies.  Colin Pena  has a past medical history of Hypertension. Also  has no past surgical history on file.  Objective:   Vitals: BP 134/74 mmHg  Pulse 108  Temp(Src) 98.1 F (36.7 C) (Oral)  Resp 16  Ht 5\' 6"  (1.676 m)  Wt 212 lb (96.163 kg)  BMI 34.23 kg/m2  SpO2 98%  Pulse was 88 on recheck by PA-Knute Mazzuca.  Physical Exam  Constitutional: He is oriented to person, place, and time. He appears well-developed and well-nourished.  HENT:  Mouth/Throat: Oropharynx is clear and moist.  Neck: Normal range of motion. Neck supple.  Cardiovascular: Normal rate.   Pulmonary/Chest: Effort normal.  Lymphadenopathy:    He has no cervical adenopathy.  Neurological: He is alert and oriented to person, place, and time.  Skin: Skin is warm and dry. Rash (multiple pruritic annular lesions about 1-2cm in size with a central clearing) noted. There is erythema (between axilla and flank bilaterally). No pallor.   Results for orders placed or performed in visit on 05/30/15 (from the past 24 hour(s))  POCT Skin KOH     Status: Normal   Collection Time: 05/30/15  2:20 PM  Result Value Ref Range   Skin KOH, POC Negative     Assessment and Plan :   1. Rash and nonspecific skin eruption 2. Itching 3. Eczema - Due to nature of rash and itchiness, will start trial of antifungal. For his history of eczema, I offered triamcinolone with eucerin. Take Zyrtec as well and use an emollient over significantly dry skin over torso and hands. Patient to f/u in 3-4 weeks if no improvement in his rash.  Wallis BambergMario Tarah Buboltz, PA-C [facility] [phone] 05/30/2015 2:02 PM

## 2015-05-30 NOTE — Patient Instructions (Signed)
Rash °A rash is a change in the color or texture of the skin. There are many different types of rashes. You may have other problems that accompany your rash. °CAUSES  °· Infections. °· Allergic reactions. This can include allergies to pets or foods. °· Certain medicines. °· Exposure to certain chemicals, soaps, or cosmetics. °· Heat. °· Exposure to poisonous plants. °· Tumors, both cancerous and noncancerous. °SYMPTOMS  °· Redness. °· Scaly skin. °· Itchy skin. °· Dry or cracked skin. °· Bumps. °· Blisters. °· Pain. °DIAGNOSIS  °Your caregiver may do a physical exam to determine what type of rash you have. A skin sample (biopsy) may be taken and examined under a microscope. °TREATMENT  °Treatment depends on the type of rash you have. Your caregiver may prescribe certain medicines. For serious conditions, you may need to see a skin doctor (dermatologist). °HOME CARE INSTRUCTIONS  °· Avoid the substance that caused your rash. °· Do not scratch your rash. This can cause infection. °· You may take cool baths to help stop itching. °· Only take over-the-counter or prescription medicines as directed by your caregiver. °· Keep all follow-up appointments as directed by your caregiver. °SEEK IMMEDIATE MEDICAL CARE IF: °· You have increasing pain, swelling, or redness. °· You have a fever. °· You have new or severe symptoms. °· You have body aches, diarrhea, or vomiting. °· Your rash is not better after 3 days. °MAKE SURE YOU: °· Understand these instructions. °· Will watch your condition. °· Will get help right away if you are not doing well or get worse. °  °This information is not intended to replace advice given to you by your health care provider. Make sure you discuss any questions you have with your health care provider. °  °Document Released: 05/26/2002 Document Revised: 06/26/2014 Document Reviewed: 10/21/2014 °Elsevier Interactive Patient Education ©2016 Elsevier Inc. °Eczema °Eczema, also called atopic dermatitis,  is a skin disorder that causes inflammation of the skin. It causes a red rash and dry, scaly skin. The skin becomes very itchy. Eczema is generally worse during the cooler winter months and often improves with the warmth of summer. Eczema usually starts showing signs in infancy. Some children outgrow eczema, but it may last through adulthood.  °CAUSES  °The exact cause of eczema is not known, but it appears to run in families. People with eczema often have a family history of eczema, allergies, asthma, or hay fever. Eczema is not contagious. °Flare-ups of the condition may be caused by:  °· Contact with something you are sensitive or allergic to.   °· Stress. °SIGNS AND SYMPTOMS °· Dry, scaly skin.   °· Red, itchy rash.   °· Itchiness. This may occur before the skin rash and may be very intense.   °DIAGNOSIS  °The diagnosis of eczema is usually made based on symptoms and medical history. °TREATMENT  °Eczema cannot be cured, but symptoms usually can be controlled with treatment and other strategies. A treatment plan might include: °· Controlling the itching and scratching.   °¨ Use over-the-counter antihistamines as directed for itching. This is especially useful at night when the itching tends to be worse.   °¨ Use over-the-counter steroid creams as directed for itching.   °¨ Avoid scratching. Scratching makes the rash and itching worse. It may also result in a skin infection (impetigo) due to a break in the skin caused by scratching.   °· Keeping the skin well moisturized with creams every day. This will seal in moisture and help prevent dryness. Lotions that contain   alcohol and water should be avoided because they can dry the skin.   °· Limiting exposure to things that you are sensitive or allergic to (allergens).   °· Recognizing situations that cause stress.   °· Developing a plan to manage stress.   °HOME CARE INSTRUCTIONS  °· Only take over-the-counter or prescription medicines as directed by your health care  provider.   °· Do not use anything on the skin without checking with your health care provider.   °· Keep baths or showers short (5 minutes) in warm (not hot) water. Use mild cleansers for bathing. These should be unscented. You may add nonperfumed bath oil to the bath water. It is best to avoid soap and bubble bath.   °· Immediately after a bath or shower, when the skin is still damp, apply a moisturizing ointment to the entire body. This ointment should be a petroleum ointment. This will seal in moisture and help prevent dryness. The thicker the ointment, the better. These should be unscented.   °· Keep fingernails cut short. Children with eczema may need to wear soft gloves or mittens at night after applying an ointment.   °· Dress in clothes made of cotton or cotton blends. Dress lightly, because heat increases itching.   °· A child with eczema should stay away from anyone with fever blisters or cold sores. The virus that causes fever blisters (herpes simplex) can cause a serious skin infection in children with eczema. °SEEK MEDICAL CARE IF:  °· Your itching interferes with sleep.   °· Your rash gets worse or is not better within 1 week after starting treatment.   °· You see pus or soft yellow scabs in the rash area.   °· You have a fever.   °· You have a rash flare-up after contact with someone who has fever blisters.   °  °This information is not intended to replace advice given to you by your health care provider. Make sure you discuss any questions you have with your health care provider. °  °Document Released: 06/02/2000 Document Revised: 03/26/2013 Document Reviewed: 01/06/2013 °Elsevier Interactive Patient Education ©2016 Elsevier Inc. ° °

## 2015-06-12 ENCOUNTER — Ambulatory Visit (INDEPENDENT_AMBULATORY_CARE_PROVIDER_SITE_OTHER): Payer: Self-pay | Admitting: Internal Medicine

## 2015-06-12 VITALS — BP 132/82 | HR 102 | Temp 98.1°F | Resp 18 | Ht 66.0 in | Wt 209.0 lb

## 2015-06-12 DIAGNOSIS — R21 Rash and other nonspecific skin eruption: Secondary | ICD-10-CM

## 2015-06-12 MED ORDER — PREDNISONE 20 MG PO TABS: ORAL_TABLET | ORAL | Status: AC

## 2015-06-12 MED ORDER — PERMETHRIN 5 % EX CREA: 1.0000 "application " | TOPICAL_CREAM | Freq: Once | CUTANEOUS | Status: DC

## 2015-06-12 NOTE — Progress Notes (Signed)
   06/12/2015 11:31 AM   DOB: Jan 30, 1956 / MRN: [id number]  SUBJECTIVE:  Colin Pena is a 59 y.o. male presenting for generalized papular pruritic rash x 2 months now.  He has a history of eczema but reports this is different.  Has tried clobetasol, topical emollients and antihistamine tabs without relief.  Reports the itching is worse at night.  Has not been treated for scabies and has not tried oral steroids.     He has No Known Allergies.   He  has a past medical history of Hypertension.    He  reports that he has quit smoking. He has never used smokeless tobacco. He reports that he does not drink alcohol or use illicit drugs. He  has no sexual activity history on file. The patient  has no past surgical history on file.  His family history includes Cancer in his father, maternal aunt, maternal uncle, maternal uncle, paternal uncle, and paternal uncle; Heart disease in his paternal uncle; Hypertension in his sister.  Review of Systems  Constitutional: Negative for fever.  Respiratory: Negative for cough.   Gastrointestinal: Negative for nausea.  Musculoskeletal: Negative for myalgias.  Skin: Positive for itching and rash.  Neurological: Negative for dizziness and headaches.    Problem list and medications reviewed and updated by myself where necessary, and exist elsewhere in the encounter.   OBJECTIVE:  BP 132/82 mmHg  Pulse 102  Temp(Src) 98.1 F (36.7 C)  Resp 18  Ht 5\' 6"  (1.676 m)  Wt 209 lb (94.802 kg)  BMI 33.75 kg/m2  SpO2 97%  Physical Exam  Constitutional: He is oriented to person, place, and time. He appears well-developed. He does not appear ill.  Eyes: Conjunctivae and EOM are normal. Pupils are equal, round, and reactive to light.  Cardiovascular: Normal rate.   Pulmonary/Chest: Effort normal.  Abdominal: He exhibits no distension.  Musculoskeletal: Normal range of motion.  Neurological: He is alert and oriented to person, place, and time. No cranial  nerve deficit. Coordination normal.  Skin: Skin is warm and dry. Rash noted. Rash is maculopapular. He is not diaphoretic.  Psychiatric: He has a normal mood and affect.  Nursing note and vitals reviewed.   No results found for this or any previous visit (from the past 48 hour(s)).  ASSESSMENT AND PLAN  Colin Pena was seen today for follow-up.  Diagnoses and all orders for this visit:  Rash and nonspecific skin eruption: No history of diabetes.  If treatment fails with either punch or send to dermatology.  -     permethrin (ACTICIN) 5 % cream; Apply 1 application topically once. -     predniSONE (DELTASONE) 20 MG tablet; Take 3 in the morning for 3 days, then 2 in the morning for 3 days, 1 in the morning for 3 days, and half a tab for three days.    The patient was advised to call or return to clinic if he does not see an improvement in symptoms or to seek the care of the closest emergency department if he worsens with the above plan.   Deliah BostonMichael Clark, MHS, PA-C [facility] 06/12/2015 11:31 AM  I have participated in the care of this patient with the Advanced Practice Provider and agree with Diagnosis and Plan as documented. Robert P. Merla Richesoolittle, M.D.

## 2015-06-28 ENCOUNTER — Emergency Department (HOSPITAL_COMMUNITY)
Admission: EM | Admit: 2015-06-28 | Discharge: 2015-06-28 | Disposition: A | Discharge status: Home / Self Care | Payer: Self-pay | Attending: Emergency Medicine | Admitting: Emergency Medicine

## 2015-06-28 ENCOUNTER — Emergency Department (HOSPITAL_COMMUNITY): Payer: Self-pay

## 2015-06-28 ENCOUNTER — Encounter (HOSPITAL_COMMUNITY): Payer: Self-pay | Admitting: Emergency Medicine

## 2015-06-28 DIAGNOSIS — Z87891 Personal history of nicotine dependence: Secondary | ICD-10-CM | POA: Insufficient documentation

## 2015-06-28 DIAGNOSIS — E669 Obesity, unspecified: Secondary | ICD-10-CM | POA: Insufficient documentation

## 2015-06-28 DIAGNOSIS — Z79899 Other long term (current) drug therapy: Secondary | ICD-10-CM | POA: Insufficient documentation

## 2015-06-28 DIAGNOSIS — Z7982 Long term (current) use of aspirin: Secondary | ICD-10-CM | POA: Insufficient documentation

## 2015-06-28 DIAGNOSIS — Z7952 Long term (current) use of systemic steroids: Secondary | ICD-10-CM | POA: Insufficient documentation

## 2015-06-28 DIAGNOSIS — R079 Chest pain, unspecified: Secondary | ICD-10-CM | POA: Insufficient documentation

## 2015-06-28 DIAGNOSIS — I10 Essential (primary) hypertension: Secondary | ICD-10-CM | POA: Insufficient documentation

## 2015-06-28 LAB — BASIC METABOLIC PANEL
ANION GAP: 11 (ref 5–15)
BUN: 17 mg/dL (ref 6–20)
CO2: 25 mmol/L (ref 22–32)
Calcium: 9.4 mg/dL (ref 8.9–10.3)
Chloride: 101 mmol/L (ref 101–111)
Creatinine, Ser: 1.1 mg/dL (ref 0.61–1.24)
GFR calc Af Amer: >60 mL/min (ref >60)
GFR calc non Af Amer: >60 mL/min (ref >60)
GLUCOSE: 106 mg/dL — AB (ref 65–99)
POTASSIUM: 4.2 mmol/L (ref 3.5–5.1)
Sodium: 137 mmol/L (ref 135–145)

## 2015-06-28 LAB — CBC
HEMATOCRIT: 49.6 % (ref 39.0–52.0)
HEMOGLOBIN: 16.8 g/dL (ref 13.0–17.0)
MCH: 30.2 pg (ref 26.0–34.0)
MCHC: 33.9 g/dL (ref 30.0–36.0)
MCV: 89 fL (ref 78.0–100.0)
Platelets: 244 10*3/uL (ref 150–400)
RBC: 5.57 MIL/uL (ref 4.22–5.81)
RDW: 13.1 % (ref 11.5–15.5)
WBC: 11.6 10*3/uL — ABNORMAL HIGH (ref 4.0–10.5)

## 2015-06-28 LAB — I-STAT TROPONIN, ED
TROPONIN I, POC: 0 ng/mL (ref 0.00–0.08)
Troponin i, poc: 0.01 ng/mL (ref 0.00–0.08)

## 2015-06-28 MED ORDER — FAMOTIDINE 20 MG PO TABS: 20.0000 mg | ORAL_TABLET | Freq: Two times a day (BID) | ORAL | Status: DC

## 2015-06-28 MED ORDER — SUCRALFATE 1 G PO TABS: 1.0000 g | ORAL_TABLET | Freq: Three times a day (TID) | ORAL | Status: DC

## 2015-06-28 NOTE — Discharge Instructions (Signed)
Take pepcid and carafate as prescribed. Follow up with cardiology as referred as soon as able. Follow up with them. Return if worsening.   Nonspecific Chest Pain  Chest pain can be caused by many different conditions. There is always a chance that your pain could be related to something serious, such as a heart attack or a blood clot in your lungs. Chest pain can also be caused by conditions that are not life-threatening. If you have chest pain, it is very important to follow up with your health care provider. CAUSES  Chest pain can be caused by:  Heartburn.  Pneumonia or bronchitis.  Anxiety or stress.  Inflammation around your heart (pericarditis) or lung (pleuritis or pleurisy).  A blood clot in your lung.  A collapsed lung (pneumothorax). It can develop suddenly on its own (spontaneous pneumothorax) or from trauma to the chest.  Shingles infection (varicella-zoster virus).  Heart attack.  Damage to the bones, muscles, and cartilage that make up your chest wall. This can include:  Bruised bones due to injury.  Strained muscles or cartilage due to frequent or repeated coughing or overwork.  Fracture to one or more ribs.  Sore cartilage due to inflammation (costochondritis). RISK FACTORS  Risk factors for chest pain may include:  Activities that increase your risk for trauma or injury to your chest.  Respiratory infections or conditions that cause frequent coughing.  Medical conditions or overeating that can cause heartburn.  Heart disease or family history of heart disease.  Conditions or health behaviors that increase your risk of developing a blood clot.  Having had chicken pox (varicella zoster). SIGNS AND SYMPTOMS Chest pain can feel like:  Burning or tingling on the surface of your chest or deep in your chest.  Crushing, pressure, aching, or squeezing pain.  Dull or sharp pain that is worse when you move, cough, or take a deep breath.  Pain that is also  felt in your back, neck, shoulder, or arm, or pain that spreads to any of these areas. Your chest pain may come and go, or it may stay constant. DIAGNOSIS Lab tests or other studies may be needed to find the cause of your pain. Your health care provider may have you take a test called an ambulatory ECG (electrocardiogram). An ECG records your heartbeat patterns at the time the test is performed. You may also have other tests, such as:  Transthoracic echocardiogram (TTE). During echocardiography, sound waves are used to create a picture of all of the heart structures and to look at how blood flows through your heart.  Transesophageal echocardiogram (TEE).This is a more advanced imaging test that obtains images from inside your body. It allows your health care provider to see your heart in finer detail.  Cardiac monitoring. This allows your health care provider to monitor your heart rate and rhythm in real time.  Holter monitor. This is a portable device that records your heartbeat and can help to diagnose abnormal heartbeats. It allows your health care provider to track your heart activity for several days, if needed.  Stress tests. These can be done through exercise or by taking medicine that makes your heart beat more quickly.  Blood tests.  Imaging tests. TREATMENT  Your treatment depends on what is causing your chest pain. Treatment may include:  Medicines. These may include:  Acid blockers for heartburn.  Anti-inflammatory medicine.  Pain medicine for inflammatory conditions.  Antibiotic medicine, if an infection is present.  Medicines to dissolve blood clots.  Medicines to treat coronary artery disease.  Supportive care for conditions that do not require medicines. This may include:  Resting.  Applying heat or cold packs to injured areas.  Limiting activities until pain decreases. HOME CARE INSTRUCTIONS  If you were prescribed an antibiotic medicine, finish it all  even if you start to feel better.  Avoid any activities that bring on chest pain.  Do not use any tobacco products, including cigarettes, chewing tobacco, or electronic cigarettes. If you need help quitting, ask your health care provider.  Do not drink alcohol.  Take medicines only as directed by your health care provider.  Keep all follow-up visits as directed by your health care provider. This is important. This includes any further testing if your chest pain does not go away.  If heartburn is the cause for your chest pain, you may be told to keep your head raised (elevated) while sleeping. This reduces the chance that acid will go from your stomach into your esophagus.  Make lifestyle changes as directed by your health care provider. These may include:  Getting regular exercise. Ask your health care provider to suggest some activities that are safe for you.  Eating a heart-healthy diet. A registered dietitian can help you to learn healthy eating options.  Maintaining a healthy weight.  Managing diabetes, if necessary.  Reducing stress. SEEK MEDICAL CARE IF:  Your chest pain does not go away after treatment.  You have a rash with blisters on your chest.  You have a fever. SEEK IMMEDIATE MEDICAL CARE IF:   Your chest pain is worse.  You have an increasing cough, or you cough up blood.  You have severe abdominal pain.  You have severe weakness.  You faint.  You have chills.  You have sudden, unexplained chest discomfort.  You have sudden, unexplained discomfort in your arms, back, neck, or jaw.  You have shortness of breath at any time.  You suddenly start to sweat, or your skin gets clammy.  You feel nauseous or you vomit.  You suddenly feel light-headed or dizzy.  Your heart begins to beat quickly, or it feels like it is skipping beats. These symptoms may represent a serious problem that is an emergency. Do not wait to see if the symptoms will go away. Get  medical help right away. Call your local emergency services (911 in the [place] Do not drive yourself to the hospital.   This information is not intended to replace advice given to you by your health care provider. Make sure you discuss any questions you have with your health care provider.   Document Released: 03/15/2005 Document Revised: 06/26/2014 Document Reviewed: 01/09/2014 Elsevier Interactive Patient Education [employer]

## 2015-06-28 NOTE — ED Notes (Signed)
Pt states that for the past 4 or 5 days, he has had lt sided chest tightness.  States that today, it was worse and was painful.  States that he took an extra strength aspirin and the pain has since subsided.  Only PMH is htn.

## 2015-06-28 NOTE — ED Provider Notes (Signed)
CSN: [id number]     Arrival date & time 06/28/15  1153 History   First MD Initiated Contact with Patient 06/28/15 1212     Chief Complaint  Patient presents with  . Chest Pain     (Consider location/radiation/quality/duration/timing/severity/associated sxs/prior Treatment) HPI Lorn Butcher is a 60 y.o. male with history of hypertension, presents to emergency department complaining of chest pain. Patient states for the last 4-5 days he has had what he describes as "chest fullness" that is after eating. He states pain is almost always after he eats something. He states the last several minutes and resolved on its own. Denies any exertional symptoms. He denies any chest pain when he uses stairs or walking. He denies any associated shortness of breath. He denies any nausea, diaphoresis, dizziness. He denies any abdominal pain. He denies black or tarry stools. He denies any vomiting. He has tried taking aspirin which he states usually helps. He denies history of heart problems. He states he recently had his cholesterol checked and it was normal. He reports cardiac problems in both his mother and father. Denies recent travel or surgeries. Denies pleuritic symptoms. No cough or URI symptoms  Past Medical History  Diagnosis Date  . Hypertension    No past surgical history on file. Family History  Problem Relation Age of Onset  . Cancer Father   . Hypertension Sister   . Cancer Maternal Aunt   . Heart disease Paternal Uncle   . Cancer Maternal Uncle   . Cancer Maternal Uncle   . Cancer Paternal Uncle   . Cancer Paternal Uncle    Social History  Substance Use Topics  . Smoking status: Former Games developer  . Smokeless tobacco: Never Used  . Alcohol Use: No    Review of Systems  Constitutional: Negative for fever and chills.  Respiratory: Positive for chest tightness. Negative for cough, shortness of breath and wheezing.   Cardiovascular: Positive for chest pain. Negative for palpitations  and leg swelling.  Gastrointestinal: Negative for nausea, vomiting, abdominal pain, diarrhea and abdominal distention.  Musculoskeletal: Negative for myalgias, arthralgias, neck pain and neck stiffness.  Skin: Negative for rash.  Allergic/Immunologic: Negative for immunocompromised state.  Neurological: Negative for dizziness, weakness, light-headedness, numbness and headaches.  All other systems reviewed and are negative.     Allergies  Review of patient's allergies indicates no known allergies.  Home Medications   Prior to Admission medications   Medication Sig Start Date End Date Taking? Authorizing Provider  amLODipine (NORVASC) 10 MG tablet Take 1 tablet (10 mg total) by mouth daily. 03/08/15  Yes Lanier Clam V, PA-C  aspirin 325 MG tablet Take 325 mg by mouth daily as needed (chest pain.).   Yes Historical Provider, MD  aspirin 81 MG chewable tablet Chew 81 mg by mouth daily.   Yes Historical Provider, MD  clobetasol cream (TEMOVATE) 0.05 % APPLY 1 APPLICATION TOPICALLY 2 (TWO) TIMES DAILY. 04/14/15  Yes Historical Provider, MD  lisinopril-hydrochlorothiazide (ZESTORETIC) 20-12.5 MG per tablet Take 1 tablet by mouth daily. 03/08/15  Yes Lanier Clam V, PA-C  NONFORMULARY OR COMPOUNDED ITEM Triamcinolone 0.1%/Eucerin. Unknown ratio.  1 application topically daily as directed. 05/30/15  Yes Historical Provider, MD   BP 126/76 mmHg  Pulse 93  Temp(Src) 98 F (36.7 C) (Oral)  Resp 18  Ht 5\' 6"  (1.676 m)  Wt 94.802 kg  BMI 33.75 kg/m2  SpO2 99% Physical Exam  Constitutional: He is oriented to person, place, and time. He appears  well-developed and well-nourished. No distress.  HENT:  Head: Normocephalic and atraumatic.  Eyes: Conjunctivae are normal.  Neck: Neck supple.  Cardiovascular: Normal rate, regular rhythm and normal heart sounds.   Pulmonary/Chest: Effort normal and breath sounds normal. No respiratory distress. He has no wheezes. He has no rales.  Abdominal: Soft.  Bowel sounds are normal. He exhibits no distension. There is no tenderness. There is no rebound.  Musculoskeletal: He exhibits no edema.  Neurological: He is alert and oriented to person, place, and time.  Skin: Skin is warm and dry.  Nursing note and vitals reviewed.   ED Course  Procedures (including critical care time) Labs Review Labs Reviewed  CBC - Abnormal; Notable for the following:    WBC 11.6 (*)    All other components within normal limits  BASIC METABOLIC PANEL - Abnormal; Notable for the following:    Glucose, Bld 106 (*)    All other components within normal limits  I-STAT TROPOININ, ED  Rosezena SensorI-STAT TROPOININ, ED    Imaging Review Dg Chest 2 View  06/28/2015  CLINICAL DATA:  New onset left-sided chest pain over the last 3 days, worse today. EXAM: CHEST - 2 VIEW COMPARISON:  None. FINDINGS: The heart size is normal. The lungs are clear. The visualized soft tissues and bony thorax are unremarkable. IMPRESSION: Negative two view chest x-ray Electronically Signed   By: Marin Robertshristopher  Mattern M.D.   On: 06/28/2015 12:31   I have personally reviewed and evaluated these images and lab results as part of my medical decision-making.   EKG Interpretation   Date/Time:  Monday June 28 2015 12:08:32 EST Ventricular Rate:  100 PR Interval:  137 QRS Duration: 104 QT Interval:  361 QTC Calculation: 466 R Axis:   -31 Text Interpretation:  Sinus tachycardia Atrial premature complex Left axis  deviation No previous tracing Confirmed by Denton LankSTEINL  MD, Caryn BeeKEVIN ([id number]) on  06/28/2015 12:33:46 PM      MDM   Final diagnoses:  Chest pain, unspecified chest pain type   Patient with left-sided chest pain, worse after eating. No exertional symptoms. Pain very atypical. Risk factors include obesity, hypertension, family history of heart problems. Patient has never had workup for stress test. He is currently chest pain-free. EKG with no acute findings. Pain is not pleuritic, currently absent,  patient is not hypoxic or tachypneic, I do not think this is a PE. There is no lower extremity swelling or pain. There is no orthopnea.  4:26 PM Labs including 2 troponins are negative. Vital signs remained normal. Patient's heart score is 3. I will have patient follow-up with cardiology. Will also start on Carafate and Pepcid for possible acid reflux.  Filed Vitals:   06/28/15 1202 06/28/15 1213 06/28/15 1405  BP: 159/91  126/76  Pulse: 110  93  Temp: 98 F (36.7 C)    TempSrc: Oral    Resp: 18  18  Height:  5\' 6"  (1.676 m)   Weight:  94.802 kg   SpO2: 100%  99%      Jaynie Crumbleatyana Mahsa Hanser, PA-C 06/28/15 1651  Cathren LaineKevin Steinl, MD 06/29/15 [phone]

## 2015-07-20 ENCOUNTER — Emergency Department (HOSPITAL_COMMUNITY)
Admission: EM | Admit: 2015-07-20 | Discharge: 2015-07-21 | Disposition: A | Discharge status: Home / Self Care | Payer: Self-pay | Attending: Emergency Medicine | Admitting: Emergency Medicine

## 2015-07-20 DIAGNOSIS — Z79899 Other long term (current) drug therapy: Secondary | ICD-10-CM | POA: Insufficient documentation

## 2015-07-20 DIAGNOSIS — Z7952 Long term (current) use of systemic steroids: Secondary | ICD-10-CM | POA: Insufficient documentation

## 2015-07-20 DIAGNOSIS — Z87891 Personal history of nicotine dependence: Secondary | ICD-10-CM | POA: Insufficient documentation

## 2015-07-20 DIAGNOSIS — I10 Essential (primary) hypertension: Secondary | ICD-10-CM | POA: Insufficient documentation

## 2015-07-20 DIAGNOSIS — Z7982 Long term (current) use of aspirin: Secondary | ICD-10-CM | POA: Insufficient documentation

## 2015-07-20 DIAGNOSIS — R21 Rash and other nonspecific skin eruption: Secondary | ICD-10-CM | POA: Insufficient documentation

## 2015-07-21 ENCOUNTER — Telehealth: Payer: Self-pay | Admitting: *Deleted

## 2015-07-21 ENCOUNTER — Encounter (HOSPITAL_COMMUNITY): Payer: Self-pay | Admitting: Emergency Medicine

## 2015-07-21 MED ORDER — PREDNISONE 10 MG PO TABS: ORAL_TABLET | ORAL | Status: DC

## 2015-07-21 MED ORDER — DIPHENHYDRAMINE HCL 25 MG PO TABS: 25.0000 mg | ORAL_TABLET | Freq: Four times a day (QID) | ORAL | Status: DC

## 2015-07-21 MED ORDER — DEXAMETHASONE SODIUM PHOSPHATE 10 MG/ML IJ SOLN
10.0000 mg | Freq: Once | INTRAMUSCULAR | Status: AC
  Administered 2015-07-21: 10 mg via INTRAMUSCULAR
  Filled 2015-07-21: qty 1

## 2015-07-21 NOTE — Discharge Instructions (Signed)

## 2015-07-21 NOTE — ED Notes (Signed)
Patient presents for rash on abdomen, back, bilateral upper quads and right axilla. C/o soreness and itching.

## 2015-07-21 NOTE — ED Notes (Signed)
Pt was prescribed prednisone and scabies medications with little relief. Noted increased redness under left arm pit.

## 2015-07-21 NOTE — ED Notes (Signed)
Provider in room  

## 2015-07-22 NOTE — ED Provider Notes (Signed)
CSN: [id number]    Arrival date & time 07/20/15  2344 History   First MD Initiated Contact with Patient 07/21/15 0113     No chief complaint on file.    (Consider location/radiation/quality/duration/timing/severity/associated sxs/prior Treatment) HPI Comments: Patient reports worsening rash that started 8 weeks ago on right upper back and has spread over bilateral upper back, arms, chest and LE's. It itches as well as burns. He has been evaluated and treated with a taper dose of steroid which he reports helped temporarily. He has also been treated for scabies which did not change his symptoms. No fever, myalgia, blistering or drainage.   The history is provided by the patient. No language interpreter was used.    Past Medical History  Diagnosis Date  . Hypertension    History reviewed. No pertinent past surgical history. Family History  Problem Relation Age of Onset  . Cancer Father   . Hypertension Sister   . Cancer Maternal Aunt   . Heart disease Paternal Uncle   . Cancer Maternal Uncle   . Cancer Maternal Uncle   . Cancer Paternal Uncle   . Cancer Paternal Uncle    Social History  Substance Use Topics  . Smoking status: Former Games developer  . Smokeless tobacco: Never Used  . Alcohol Use: No    Review of Systems  Constitutional: Negative for fever.  Gastrointestinal: Negative for nausea.  Musculoskeletal: Negative for myalgias and arthralgias.  Skin: Positive for rash. Negative for wound.  Neurological: Negative for headaches.      Allergies  Review of patient's allergies indicates no known allergies.  Home Medications   Prior to Admission medications   Medication Sig Start Date End Date Taking? Authorizing Provider  amLODipine (NORVASC) 10 MG tablet Take 1 tablet (10 mg total) by mouth daily. 03/08/15   Dorna Leitz, PA-C  aspirin 325 MG tablet Take 325 mg by mouth daily as needed (chest pain.).    Historical Provider, MD  aspirin 81 MG chewable tablet Chew 81 mg  by mouth daily.    Historical Provider, MD  clobetasol cream (TEMOVATE) 0.05 % APPLY 1 APPLICATION TOPICALLY 2 (TWO) TIMES DAILY. 04/14/15   Historical Provider, MD  diphenhydrAMINE (BENADRYL) 25 MG tablet Take 1 tablet (25 mg total) by mouth every 6 (six) hours. 07/21/15   Elpidio Anis, PA-C  famotidine (PEPCID) 20 MG tablet Take 1 tablet (20 mg total) by mouth 2 (two) times daily. 06/28/15   Tatyana Kirichenko, PA-C  lisinopril-hydrochlorothiazide (ZESTORETIC) 20-12.5 MG per tablet Take 1 tablet by mouth daily. 03/08/15   Dorna Leitz, PA-C  NONFORMULARY OR COMPOUNDED ITEM Triamcinolone 0.1%/Eucerin. Unknown ratio.  1 application topically daily as directed. 05/30/15   Historical Provider, MD  predniSONE (DELTASONE) 10 MG tablet Take 6 on day 1 (07/21/15) Take 5 on day 2 Take 4 on day 3 Take 3 on day 4 Take 2 on day 5 Take 1 on day 6 07/21/15   Elpidio Anis, PA-C  sucralfate (CARAFATE) 1 g tablet Take 1 tablet (1 g total) by mouth 4 (four) times daily -  with meals and at bedtime. 06/28/15   Tatyana Kirichenko, PA-C   BP 155/88 mmHg  Pulse 88  Temp(Src) 98.6 F (37 C) (Oral)  Resp 18  SpO2 100% Physical Exam  Constitutional: He is oriented to person, place, and time. He appears well-developed and well-nourished. No distress.  Musculoskeletal: Normal range of motion. He exhibits no edema.  Neurological: He is alert and oriented to person,  place, and time.  Skin:  Maculopapular, non-blistering, mildly erythematous rash in generalized distribution. Nonspecific.     ED Course  Procedures (including critical care time) Labs Review Labs Reviewed - No data to display  Imaging Review No results found. I have personally reviewed and evaluated these images and lab results as part of my medical decision-making.   EKG Interpretation None      MDM   Final diagnoses:  Rash and nonspecific skin eruption    Will provide another taper dose steroid and refer to dermatology for persistent  rash.     Elpidio Anis, PA-C 07/22/15 0008  Azalia Bilis, MD 07/22/15 [phone]

## 2015-11-19 DIAGNOSIS — Z79899 Other long term (current) drug therapy: Secondary | ICD-10-CM | POA: Insufficient documentation

## 2015-11-19 DIAGNOSIS — Z7982 Long term (current) use of aspirin: Secondary | ICD-10-CM | POA: Insufficient documentation

## 2015-11-19 DIAGNOSIS — I10 Essential (primary) hypertension: Secondary | ICD-10-CM | POA: Insufficient documentation

## 2015-11-19 DIAGNOSIS — R21 Rash and other nonspecific skin eruption: Secondary | ICD-10-CM | POA: Insufficient documentation

## 2015-11-19 DIAGNOSIS — Z87891 Personal history of nicotine dependence: Secondary | ICD-10-CM | POA: Insufficient documentation

## 2015-11-19 DIAGNOSIS — Z7952 Long term (current) use of systemic steroids: Secondary | ICD-10-CM | POA: Insufficient documentation

## 2015-11-20 ENCOUNTER — Emergency Department (HOSPITAL_COMMUNITY)
Admission: EM | Admit: 2015-11-20 | Discharge: 2015-11-20 | Disposition: A | Discharge status: Home / Self Care | Payer: Self-pay | Attending: Emergency Medicine | Admitting: Emergency Medicine

## 2015-11-20 ENCOUNTER — Encounter (HOSPITAL_COMMUNITY): Payer: Self-pay | Admitting: Emergency Medicine

## 2015-11-20 DIAGNOSIS — R21 Rash and other nonspecific skin eruption: Secondary | ICD-10-CM

## 2015-11-20 MED ORDER — PREDNISONE 20 MG PO TABS: 40.0000 mg | ORAL_TABLET | Freq: Every day | ORAL | Status: DC

## 2015-11-20 NOTE — ED Provider Notes (Signed)
CSN: [id number]     Arrival date & time 11/19/15  2357 History   First MD Initiated Contact with Patient 11/20/15 [phone]     Chief Complaint  Patient presents with  . Rash     Patient is a 60 y.o. male presenting with rash. The history is provided by the patient.  Rash Associated symptoms: no abdominal pain, no fever and no shortness of breath   Patient has had a rash on off for the last several months. Initially diagnosed with scabies but then told later did not look like scabies. Has had steroids couple times which is helped. No new associated is itchy. Started on his back but is moved to most of his body. His been typically on his chest and thighs. Mild relief with Benadryl although makes him sleepy. No fevers. No weight loss.  Past Medical History  Diagnosis Date  . Hypertension    History reviewed. No pertinent past surgical history. Family History  Problem Relation Age of Onset  . Cancer Father   . Hypertension Sister   . Cancer Maternal Aunt   . Heart disease Paternal Uncle   . Cancer Maternal Uncle   . Cancer Maternal Uncle   . Cancer Paternal Uncle   . Cancer Paternal Uncle    Social History  Substance Use Topics  . Smoking status: Former Games developermoker  . Smokeless tobacco: Never Used  . Alcohol Use: No    Review of Systems  Constitutional: Negative for fever and appetite change.  Respiratory: Negative for shortness of breath.   Cardiovascular: Negative for chest pain.  Gastrointestinal: Negative for abdominal pain.  Skin: Positive for color change, rash and wound.      Allergies  Review of patient's allergies indicates no known allergies.  Home Medications   Prior to Admission medications   Medication Sig Start Date End Date Taking? Authorizing Provider  amLODipine (NORVASC) 10 MG tablet Take 1 tablet (10 mg total) by mouth daily. 03/08/15   Dorna LeitzNicole Bush V, PA-C  aspirin 325 MG tablet Take 325 mg by mouth daily as needed (chest pain.).    Historical Provider, MD   aspirin 81 MG chewable tablet Chew 81 mg by mouth daily.    Historical Provider, MD  clobetasol cream (TEMOVATE) 0.05 % APPLY 1 APPLICATION TOPICALLY 2 (TWO) TIMES DAILY. 04/14/15   Historical Provider, MD  diphenhydrAMINE (BENADRYL) 25 MG tablet Take 1 tablet (25 mg total) by mouth every 6 (six) hours. 07/21/15   Elpidio AnisShari Upstill, PA-C  famotidine (PEPCID) 20 MG tablet Take 1 tablet (20 mg total) by mouth 2 (two) times daily. 06/28/15   Tatyana Kirichenko, PA-C  lisinopril-hydrochlorothiazide (ZESTORETIC) 20-12.5 MG per tablet Take 1 tablet by mouth daily. 03/08/15   Dorna LeitzNicole Bush V, PA-C  NONFORMULARY OR COMPOUNDED ITEM Triamcinolone 0.1%/Eucerin. Unknown ratio.  1 application topically daily as directed. 05/30/15   Historical Provider, MD  predniSONE (DELTASONE) 20 MG tablet Take 2 tablets (40 mg total) by mouth daily. 11/20/15   Benjiman CoreNathan Lakeisa Heninger, MD  sucralfate (CARAFATE) 1 g tablet Take 1 tablet (1 g total) by mouth 4 (four) times daily -  with meals and at bedtime. 06/28/15   Tatyana Kirichenko, PA-C   BP 130/87 mmHg  Pulse 76  Temp(Src) 98.5 F (36.9 C) (Oral)  Resp 18  Ht 5' 8.5" (1.74 m)  Wt 195 lb (88.451 kg)  BMI 29.21 kg/m2  SpO2 100% Physical Exam  Constitutional: He appears well-developed.  Cardiovascular: Normal rate.   Musculoskeletal: He exhibits no  edema.  Neurological: He is alert.  Skin: Skin is warm.   erythematousraised slightly scaly pruritic rash on chest back and arms and somewhat legs. There is smaller circular areas on the legs with various areas of excoriation. No involvement of palms.     ED Course  Procedures (including critical care time) Labs Review Labs Reviewed - No data to display  Imaging Review No results found. I have personally reviewed and evaluated these images and lab results as part of my medical decision-making.   EKG Interpretation None      MDM   Final diagnoses:  Rash    Patient with rash. Does not fluoresce under UV. Derm follow wup  with steroids.    Benjiman Core, MD 11/20/15 [phone]

## 2015-11-20 NOTE — Discharge Instructions (Signed)

## 2015-11-20 NOTE — ED Notes (Signed)
Patient with a rash that he has had over multiple months.  Patient was given Prednisone and scabies medication.  Patient states that it has spread to the left side and over his torso.  He states that he doesn't think that it is scabies any longer or even it was scabies in the first place.

## 2016-05-16 ENCOUNTER — Encounter (HOSPITAL_COMMUNITY): Payer: Self-pay | Admitting: *Deleted

## 2016-05-16 DIAGNOSIS — I10 Essential (primary) hypertension: Secondary | ICD-10-CM | POA: Insufficient documentation

## 2016-05-16 DIAGNOSIS — Z87891 Personal history of nicotine dependence: Secondary | ICD-10-CM | POA: Insufficient documentation

## 2016-05-16 DIAGNOSIS — Z7982 Long term (current) use of aspirin: Secondary | ICD-10-CM | POA: Insufficient documentation

## 2016-05-16 DIAGNOSIS — L309 Dermatitis, unspecified: Secondary | ICD-10-CM | POA: Insufficient documentation

## 2016-05-16 LAB — CBC WITH DIFFERENTIAL/PLATELET
BASOS ABS: 0.1 10*3/uL (ref 0.0–0.1)
BASOS PCT: 1 %
Eosinophils Absolute: 0.9 10*3/uL — ABNORMAL HIGH (ref 0.0–0.7)
Eosinophils Relative: 11 %
HEMATOCRIT: 42 % (ref 39.0–52.0)
Hemoglobin: 14.4 g/dL (ref 13.0–17.0)
LYMPHS PCT: 14 %
Lymphs Abs: 1.2 10*3/uL (ref 0.7–4.0)
MCH: 30 pg (ref 26.0–34.0)
MCHC: 34.3 g/dL (ref 30.0–36.0)
MCV: 87.5 fL (ref 78.0–100.0)
Monocytes Absolute: 0.6 10*3/uL (ref 0.1–1.0)
Monocytes Relative: 7 %
NEUTROS ABS: 5.6 10*3/uL (ref 1.7–7.7)
NEUTROS PCT: 67 %
PLATELETS: 223 10*3/uL (ref 150–400)
RBC: 4.8 MIL/uL (ref 4.22–5.81)
RDW: 12.9 % (ref 11.5–15.5)
WBC: 8.4 10*3/uL (ref 4.0–10.5)

## 2016-05-16 NOTE — ED Triage Notes (Signed)
Pt c/o generalized body rash x 2 weeks with chills and fever. Reddened area noted to bilateral arms, abdomen, and back. Denies sob

## 2016-05-17 ENCOUNTER — Emergency Department (HOSPITAL_COMMUNITY)
Admission: EM | Admit: 2016-05-17 | Discharge: 2016-05-17 | Disposition: A | Discharge status: Home / Self Care | Payer: Self-pay | Attending: Emergency Medicine | Admitting: Emergency Medicine

## 2016-05-17 DIAGNOSIS — L309 Dermatitis, unspecified: Secondary | ICD-10-CM

## 2016-05-17 LAB — COMPREHENSIVE METABOLIC PANEL
ALBUMIN: 3.7 g/dL (ref 3.5–5.0)
ALT: 26 U/L (ref 17–63)
AST: 22 U/L (ref 15–41)
Alkaline Phosphatase: 74 U/L (ref 38–126)
Anion gap: 9 (ref 5–15)
BUN: 13 mg/dL (ref 6–20)
CHLORIDE: 107 mmol/L (ref 101–111)
CO2: 21 mmol/L — AB (ref 22–32)
CREATININE: 1.08 mg/dL (ref 0.61–1.24)
Calcium: 8.5 mg/dL — ABNORMAL LOW (ref 8.9–10.3)
GFR calc Af Amer: >60 mL/min (ref >60)
GFR calc non Af Amer: >60 mL/min (ref >60)
GLUCOSE: 113 mg/dL — AB (ref 65–99)
POTASSIUM: 3.6 mmol/L (ref 3.5–5.1)
SODIUM: 137 mmol/L (ref 135–145)
Total Bilirubin: 1.1 mg/dL (ref 0.3–1.2)
Total Protein: 6.1 g/dL — ABNORMAL LOW (ref 6.5–8.1)

## 2016-05-17 MED ORDER — HYDROXYZINE HCL 25 MG PO TABS: 25.0000 mg | ORAL_TABLET | Freq: Three times a day (TID) | ORAL | 0 refills | Status: DC | PRN

## 2016-05-17 MED ORDER — CLOBETASOL PROPIONATE 0.05 % EX CREA
TOPICAL_CREAM | Freq: Two times a day (BID) | CUTANEOUS | Status: DC
  Administered 2016-05-17: 03:00:00 via TOPICAL
  Filled 2016-05-17: qty 15

## 2016-05-17 MED ORDER — CLOBETASOL PROPIONATE 0.05 % EX LOTN: 1.0000 "application " | TOPICAL_LOTION | Freq: Two times a day (BID) | CUTANEOUS | 0 refills | Status: DC

## 2016-05-17 NOTE — Discharge Instructions (Addendum)
1. Medications: Hydroxyzine, clobetasol, usual home medications 2. Treatment: rest, drink plenty of fluids, keep area clean and dry, use moisturizing lotion 3. Follow Up: Please followup with your primary doctor in 5-7days for discussion of your diagnoses and further evaluation after today's visit; Please also use the resource guide to find a dermatologist.  If you do not have a primary care doctor use the resource guide provided to find one; Please return to the ER for worsening rash, high fevers, difficulty breathing or other concerns.   [facility]:  [facility]  [address]  [phone]   Dr. Mertha FindersJohn H. Hall Jr, MD  [address]  [phone]  [facility]  [address]  [phone]   [facility]  [address]  [phone]  Janalyn Harderafeen Stuart MD  [address]  [phone]  Hoyle SauerMccoy Bruce  [address]  [phone]  [place] Amy Y MD  [address]  [phone]  [facility]  [address]  [phone]

## 2016-05-17 NOTE — ED Provider Notes (Signed)
[facility]-EMERGENCY DEPT Provider Note   CSN: [id number] Arrival date & time: 05/16/16  2307     History   Chief Complaint Chief Complaint  Patient presents with  . Rash    HPI Colin Pena is a 60 y.o. male with a hx of HTN, eczema presents to the Emergency Department complaining of gradual, persistent, progressively worsening rash covering most of his body onset 2.5 weeks ago. Patient reports several additional patches of rash on the left hip and right inner thigh which present for greater than 8 months. He reports he has previously taken steroid packs with some improvement. He has not seen a dermatologist due to his lack of insurance. Patient reports using over-the-counter cortisone cream and taking very hot, long showers without relief.  Associated symptoms include bleeding from significant excoriation.  Patient reports feeling hot and having "chills" along his back but denies measuring a fever at home. He denies headache, neck pain, chest pain, shortness of breath, abdominal pain, nausea, vomiting, diarrhea, weakness, dizziness, syncope.     The history is provided by the patient and medical records. No language interpreter was used.  Rash      Past Medical History:  Diagnosis Date  . Hypertension     Patient Active Problem List   Diagnosis Date Noted  . Hypertension 05/09/2012    History reviewed. No pertinent surgical history.     Home Medications    Prior to Admission medications   Medication Sig Start Date End Date Taking? Authorizing Provider  amLODipine (NORVASC) 10 MG tablet Take 1 tablet (10 mg total) by mouth daily. 03/08/15   Dorna LeitzNicole V Bush, PA-C  aspirin 325 MG tablet Take 325 mg by mouth daily as needed (chest pain.).    Historical Provider, MD  aspirin 81 MG chewable tablet Chew 81 mg by mouth daily.    Historical Provider, MD  Clobetasol Propionate 0.05 % lotion Apply 1 application topically 2 (two) times daily. Apply to rash avoiding face, genitals  and folds of skin 05/17/16   Dahlia ClientHannah Estell Puccini, PA-C  diphenhydrAMINE (BENADRYL) 25 MG tablet Take 1 tablet (25 mg total) by mouth every 6 (six) hours. 07/21/15   Elpidio AnisShari Upstill, PA-C  famotidine (PEPCID) 20 MG tablet Take 1 tablet (20 mg total) by mouth 2 (two) times daily. 06/28/15   Tatyana Kirichenko, PA-C  hydrOXYzine (ATARAX/VISTARIL) 25 MG tablet Take 1 tablet (25 mg total) by mouth every 8 (eight) hours as needed for itching. 05/17/16   Butler Vegh, PA-C  lisinopril-hydrochlorothiazide (ZESTORETIC) 20-12.5 MG per tablet Take 1 tablet by mouth daily. 03/08/15   Dorna LeitzNicole V Bush, PA-C  NONFORMULARY OR COMPOUNDED ITEM Triamcinolone 0.1%/Eucerin. Unknown ratio.  1 application topically daily as directed. 05/30/15   Historical Provider, MD  predniSONE (DELTASONE) 20 MG tablet Take 2 tablets (40 mg total) by mouth daily. 11/20/15   Benjiman CoreNathan Pickering, MD  sucralfate (CARAFATE) 1 g tablet Take 1 tablet (1 g total) by mouth 4 (four) times daily -  with meals and at bedtime. 06/28/15   Jaynie Crumbleatyana Kirichenko, PA-C    Family History Family History  Problem Relation Age of Onset  . Cancer Father   . Hypertension Sister   . Cancer Maternal Aunt   . Heart disease Paternal Uncle   . Cancer Maternal Uncle   . Cancer Maternal Uncle   . Cancer Paternal Uncle   . Cancer Paternal Uncle     Social History Social History  Substance Use Topics  . Smoking status: Former Games developermoker  .  Smokeless tobacco: Never Used  . Alcohol use No     Allergies   Patient has no known allergies.   Review of Systems Review of Systems  Constitutional: Positive for chills and fever ( Subjective).  Skin: Positive for rash.  All other systems reviewed and are negative.    Physical Exam Updated Vital Signs BP 154/75 (BP Location: Right Arm)   Pulse 115   Temp 98.3 F (36.8 C) (Oral)   Resp 18   SpO2 99%   Physical Exam  Constitutional: He is oriented to person, place, and time. He appears well-developed and  well-nourished. No distress.  HENT:  Head: Normocephalic and atraumatic.  Right Ear: Tympanic membrane, external ear and ear canal normal.  Left Ear: Tympanic membrane, external ear and ear canal normal.  Nose: Nose normal. No mucosal edema or rhinorrhea.  Mouth/Throat: Uvula is midline. No uvula swelling. No oropharyngeal exudate, posterior oropharyngeal edema, posterior oropharyngeal erythema or tonsillar abscesses.  No swelling of the uvula or oropharynx  Eyes: Conjunctivae are normal.  Neck: Normal range of motion.  Patent airway No stridor; normal phonation Handling secretions without difficulty  Cardiovascular: Normal rate, normal heart sounds and intact distal pulses.   No murmur heard. Pulmonary/Chest: Effort normal. No stridor. No respiratory distress.  Abdominal: Soft. Bowel sounds are normal. There is no tenderness.  Musculoskeletal: Normal range of motion. He exhibits no edema.  Neurological: He is alert and oriented to person, place, and time.  Skin: Skin is warm and dry. Rash noted. He is not diaphoretic.  Erythematous, raised, scaling patches of rash along the neck, chest, back, arms, bilateral hands and legs; worst in the flexural folds Severe excoriations with areas of weeping and bleeding - no induration or fluctuance to indicate secondary infection  Psychiatric: He has a normal mood and affect.  Nursing note and vitals reviewed.    ED Treatments / Results  Labs (all labs ordered are listed, but only abnormal results are displayed) Labs Reviewed  CBC WITH DIFFERENTIAL/PLATELET - Abnormal; Notable for the following:       Result Value   Eosinophils Absolute 0.9 (*)    All other components within normal limits  COMPREHENSIVE METABOLIC PANEL - Abnormal; Notable for the following:    CO2 21 (*)    Glucose, Bld 113 (*)    Calcium 8.5 (*)    Total Protein 6.1 (*)    All other components within normal limits     Procedures Procedures (including critical care  time)  Medications Ordered in ED Medications  clobetasol cream (TEMOVATE) 0.05 % (not administered)     Initial Impression / Assessment and Plan / ED Course  I have reviewed the triage vital signs and the nursing notes.  Pertinent labs & imaging results that were available during my care of the patient were reviewed by me and considered in my medical decision making (see chart for details).  Clinical Course as of May 17 256  Wed May 17, 2016  [id number] The patient was discussed with and seen by Dr. Elesa MassedWard who agrees with the treatment plan.   [HM]  0254 No leukocytosis WBC: 8.4 [HM]  0254 Improved vitals; tachycardia resolved BP: 133/83 [HM]    Clinical Course User Index [HM] Callia Swim, PA-C    Pt with diffuse rash consistent with Eczema.  No signs of secondary infection.  Pt has been seen here in the ED x2 in the last 12 mos with similar complaints.  He reports improvement with oral  steroids, however I do no think this is the best course of treatment.  Pt will be d/c home with hydroxyzine and clobetasol.  Conservative therapies including increased hydration and cooler showers recommended.      Final Clinical Impressions(s) / ED Diagnoses   Final diagnoses:  Eczema, unspecified type    New Prescriptions New Prescriptions   CLOBETASOL PROPIONATE 0.05 % LOTION    Apply 1 application topically 2 (two) times daily. Apply to rash avoiding face, genitals and folds of skin   HYDROXYZINE (ATARAX/VISTARIL) 25 MG TABLET    Take 1 tablet (25 mg total) by mouth every 8 (eight) hours as needed for itching.     Dahlia Client Greer Wainright, PA-C 05/17/16 0257    Layla Maw Ward, DO 05/17/16 1610

## 2017-12-17 ENCOUNTER — Encounter (HOSPITAL_COMMUNITY): Payer: Self-pay | Admitting: Emergency Medicine

## 2017-12-17 ENCOUNTER — Emergency Department (HOSPITAL_COMMUNITY)
Admission: EM | Admit: 2017-12-17 | Discharge: 2017-12-17 | Disposition: A | Discharge status: Home / Self Care | Payer: Self-pay | Attending: Emergency Medicine | Admitting: Emergency Medicine

## 2017-12-17 ENCOUNTER — Other Ambulatory Visit: Payer: Self-pay

## 2017-12-17 DIAGNOSIS — Z79899 Other long term (current) drug therapy: Secondary | ICD-10-CM | POA: Insufficient documentation

## 2017-12-17 DIAGNOSIS — I10 Essential (primary) hypertension: Secondary | ICD-10-CM | POA: Insufficient documentation

## 2017-12-17 DIAGNOSIS — K118 Other diseases of salivary glands: Secondary | ICD-10-CM | POA: Insufficient documentation

## 2017-12-17 DIAGNOSIS — Z87891 Personal history of nicotine dependence: Secondary | ICD-10-CM | POA: Insufficient documentation

## 2017-12-17 DIAGNOSIS — Z7982 Long term (current) use of aspirin: Secondary | ICD-10-CM | POA: Insufficient documentation

## 2017-12-17 DIAGNOSIS — K0889 Other specified disorders of teeth and supporting structures: Secondary | ICD-10-CM

## 2017-12-17 MED ORDER — HYDROCODONE-ACETAMINOPHEN 5-325 MG PO TABS: 2.0000 | ORAL_TABLET | ORAL | 0 refills | Status: DC | PRN

## 2017-12-17 MED ORDER — IBUPROFEN 800 MG PO TABS
800.0000 mg | ORAL_TABLET | Freq: Once | ORAL | Status: AC
  Administered 2017-12-17: 800 mg via ORAL
  Filled 2017-12-17: qty 1

## 2017-12-17 MED ORDER — CEPHALEXIN 500 MG PO CAPS: 500.0000 mg | ORAL_CAPSULE | Freq: Four times a day (QID) | ORAL | 0 refills | Status: DC

## 2017-12-17 NOTE — ED Notes (Signed)
Patient verbalized understanding of discharge instructions and prescription medications and denies any further needs or questions at this time. Given dental resource guide and went places that accept patient without dental insurance. VS stable. Patient ambulatory with steady gait.

## 2017-12-17 NOTE — Discharge Instructions (Signed)
Schedule to see the dentist for evaluation °

## 2017-12-18 NOTE — ED Provider Notes (Signed)
[facility]   CSN: [id number] Arrival date & time: 12/17/17  2123     History   Chief Complaint Chief Complaint  Patient presents with  . Dental Pain    HPI Colin Pena is a 62 y.o. male.  The history is provided by the patient. No language interpreter was used.  Dental Pain   This is a new problem. The current episode started yesterday. The problem occurs constantly. The problem has been gradually worsening. The pain is moderate. He has tried nothing for the symptoms. The treatment provided no relief.  Pt complains of swelling under his tongue and his gumline.  Pt reports he does not have a dentist.  Pt is on lisinopril.  Pt complains of soreness under tongue.   Past Medical History:  Diagnosis Date  . Hypertension     Patient Active Problem List   Diagnosis Date Noted  . Hypertension 05/09/2012    History reviewed. No pertinent surgical history.      Home Medications    Prior to Admission medications   Medication Sig Start Date End Date Taking? Authorizing Provider  amLODipine (NORVASC) 10 MG tablet Take 1 tablet (10 mg total) by mouth daily. 03/08/15   Dorna LeitzBush, Nicole V, PA-C  aspirin 325 MG tablet Take 325 mg by mouth daily as needed (chest pain.).    [provider]  aspirin 81 MG chewable tablet Chew 81 mg by mouth daily.    [provider]  cephALEXin (KEFLEX) 500 MG capsule Take 1 capsule (500 mg total) by mouth 4 (four) times daily. 12/17/17   Elson AreasSofia, Rihaan Barrack K, PA-C  Clobetasol Propionate 0.05 % lotion Apply 1 application topically 2 (two) times daily. Apply to rash avoiding face, genitals and folds of skin 05/17/16   Muthersbaugh, Dahlia ClientHannah, PA-C  diphenhydrAMINE (BENADRYL) 25 MG tablet Take 1 tablet (25 mg total) by mouth every 6 (six) hours. 07/21/15   Elpidio AnisUpstill, Shari, PA-C  famotidine (PEPCID) 20 MG tablet Take 1 tablet (20 mg total) by mouth 2 (two) times daily. 06/28/15   Kirichenko, Lemont Fillersatyana,  PA-C  HYDROcodone-acetaminophen (NORCO/VICODIN) 5-325 MG tablet Take 2 tablets by mouth every 4 (four) hours as needed. 12/17/17   Elson AreasSofia, Laureano Hetzer K, PA-C  hydrOXYzine (ATARAX/VISTARIL) 25 MG tablet Take 1 tablet (25 mg total) by mouth every 8 (eight) hours as needed for itching. 05/17/16   Muthersbaugh, Dahlia ClientHannah, PA-C  lisinopril-hydrochlorothiazide (ZESTORETIC) 20-12.5 MG per tablet Take 1 tablet by mouth daily. 03/08/15   Dorna LeitzBush, Nicole V, PA-C  NONFORMULARY OR COMPOUNDED ITEM Triamcinolone 0.1%/Eucerin. Unknown ratio.  1 application topically daily as directed. 05/30/15   [provider]  predniSONE (DELTASONE) 20 MG tablet Take 2 tablets (40 mg total) by mouth daily. 11/20/15   Benjiman CorePickering, Nathan, MD  sucralfate (CARAFATE) 1 g tablet Take 1 tablet (1 g total) by mouth 4 (four) times daily -  with meals and at bedtime. 06/28/15   Jaynie CrumbleKirichenko, Tatyana, PA-C    Family History Family History  Problem Relation Age of Onset  . Cancer Father   . Hypertension Sister   . Cancer Maternal Aunt   . Heart disease Paternal Uncle   . Cancer Maternal Uncle   . Cancer Maternal Uncle   . Cancer Paternal Uncle   . Cancer Paternal Uncle     Social History Social History   Tobacco Use  . Smoking status: Former Games developermoker  . Smokeless tobacco: Never Used  Substance Use Topics  . Alcohol use:  No    Alcohol/week: 0.0 oz  . Drug use: No     Allergies   Patient has no known allergies.   Review of Systems Review of Systems  All other systems reviewed and are negative.    Physical Exam Updated Vital Signs BP (!) 169/92 (BP Location: Right Arm)   Pulse 96   Temp 99 F (37.2 C) (Oral)   Resp 18   SpO2 97%   Physical Exam  Constitutional: He appears well-developed and well-nourished.  HENT:  Head: Normocephalic and atraumatic.  Slight swelling under tongue left side of mouth, swelling to gumline.  Broken tooth   Eyes: Conjunctivae are normal.  Neck: Neck supple.  Cardiovascular: Normal rate.   No murmur heard. Pulmonary/Chest: Effort normal. No respiratory distress.  Abdominal: There is no tenderness.  Musculoskeletal: He exhibits no edema.  Neurological: He is alert.  Skin: Skin is warm and dry.  Psychiatric: He has a normal mood and affect.  Nursing note and vitals reviewed.    ED Treatments / Results  Labs (all labs ordered are listed, but only abnormal results are displayed) Labs Reviewed - No data to display  EKG None  Radiology No results found.  Procedures Procedures (including critical care time)  Medications Ordered in ED Medications  ibuprofen (ADVIL,MOTRIN) tablet 800 mg (800 mg Oral Given 12/17/17 2216)  MDM  Swelling does not look like angioedema,  Gum is slightly swollen,  Swelling could be due to dental infection,  I am also suspicious for possible salivary gland obstruction.   Pt advised to follow up with dentist.  Pt given rx for keflex and hydrocodone for pain   Initial Impression / Assessment and Plan / ED Course  I have reviewed the triage vital signs and the nursing notes.  Pertinent labs & imaging results that were available during my care of the patient were reviewed by me and considered in my medical decision making (see chart for details).       Final Clinical Impressions(s) / ED Diagnoses   Final diagnoses:  Pain, dental  Salivary duct obstruction    ED Discharge Orders        Ordered    cephALEXin (KEFLEX) 500 MG capsule  4 times daily     12/17/17 2154    HYDROcodone-acetaminophen (NORCO/VICODIN) 5-325 MG tablet  Every 4 hours PRN     12/17/17 2154       Osie Cheeks 12/18/17 0109    Arby Barrette, MD 12/19/17 1504

## 2019-01-07 ENCOUNTER — Ambulatory Visit (INDEPENDENT_AMBULATORY_CARE_PROVIDER_SITE_OTHER): Payer: Self-pay | Admitting: Emergency Medicine

## 2019-01-07 ENCOUNTER — Other Ambulatory Visit: Payer: Self-pay

## 2019-01-07 ENCOUNTER — Encounter: Payer: Self-pay | Admitting: Emergency Medicine

## 2019-01-07 VITALS — BP 168/94 | HR 100 | Temp 99.1°F | Resp 16 | Ht 68.0 in | Wt 199.0 lb

## 2019-01-07 DIAGNOSIS — I10 Essential (primary) hypertension: Secondary | ICD-10-CM

## 2019-01-07 MED ORDER — AMLODIPINE BESYLATE 10 MG PO TABS: 10.0000 mg | ORAL_TABLET | Freq: Every day | ORAL | 3 refills | Status: DC

## 2019-01-07 MED ORDER — LISINOPRIL-HYDROCHLOROTHIAZIDE 20-12.5 MG PO TABS: 1.0000 | ORAL_TABLET | Freq: Every day | ORAL | 3 refills | Status: DC

## 2019-01-07 NOTE — Patient Instructions (Addendum)
   If you have lab work done today you will be contacted with your lab results within the next 2 weeks.  If you have not heard from us then please contact us. The fastest way to get your results is to register for My Chart.   IF you received an x-ray today, you will receive an invoice from [facility]. Please contact [facility] at [phone] with questions or concerns regarding your invoice.   IF you received labwork today, you will receive an invoice from [facility]. Please contact [facility] at [phone] with questions or concerns regarding your invoice.   Our billing staff will not be able to assist you with questions regarding bills from these companies.  You will be contacted with the lab results as soon as they are available. The fastest way to get your results is to activate your My Chart account. Instructions are located on the last page of this paperwork. If you have not heard from us regarding the results in 2 weeks, please contact this office.     Hypertension, Adult High blood pressure (hypertension) is when the force of blood pumping through the arteries is too strong. The arteries are the blood vessels that carry blood from the heart throughout the body. Hypertension forces the heart to work harder to pump blood and may cause arteries to become narrow or stiff. Untreated or uncontrolled hypertension can cause a heart attack, heart failure, a stroke, kidney disease, and other problems. A blood pressure reading consists of a higher number over a lower number. Ideally, your blood pressure should be below 120/80. The first ("top") number is called the systolic pressure. It is a measure of the pressure in your arteries as your heart beats. The second ("bottom") number is called the diastolic pressure. It is a measure of the pressure in your arteries as the heart relaxes. What are the causes? The exact cause of this condition is not known. There are some conditions  that result in or are related to high blood pressure. What increases the risk? Some risk factors for high blood pressure are under your control. The following factors may make you more likely to develop this condition:  Smoking.  Having type 2 diabetes mellitus, high cholesterol, or both.  Not getting enough exercise or physical activity.  Being overweight.  Having too much fat, sugar, calories, or salt (sodium) in your diet.  Drinking too much alcohol. Some risk factors for high blood pressure may be difficult or impossible to change. Some of these factors include:  Having chronic kidney disease.  Having a family history of high blood pressure.  Age. Risk increases with age.  Race. You may be at higher risk if you are African American.  Gender. Men are at higher risk than women before age 45. After age 65, women are at higher risk than men.  Having obstructive sleep apnea.  Stress. What are the signs or symptoms? High blood pressure may not cause symptoms. Very high blood pressure (hypertensive crisis) may cause:  Headache.  Anxiety.  Shortness of breath.  Nosebleed.  Nausea and vomiting.  Vision changes.  Severe chest pain.  Seizures. How is this diagnosed? This condition is diagnosed by measuring your blood pressure while you are seated, with your arm resting on a flat surface, your legs uncrossed, and your feet flat on the floor. The cuff of the blood pressure monitor will be placed directly against the skin of your upper arm at the level of your heart.   It should be measured at least twice using the same arm. Certain conditions can cause a difference in blood pressure between your right and left arms. Certain factors can cause blood pressure readings to be lower or higher than normal for a short period of time:  When your blood pressure is higher when you are in a health care provider's office than when you are at home, this is called white coat hypertension.  Most people with this condition do not need medicines.  When your blood pressure is higher at home than when you are in a health care provider's office, this is called masked hypertension. Most people with this condition may need medicines to control blood pressure. If you have a high blood pressure reading during one visit or you have normal blood pressure with other risk factors, you may be asked to:  Return on a different day to have your blood pressure checked again.  Monitor your blood pressure at home for 1 week or longer. If you are diagnosed with hypertension, you may have other blood or imaging tests to help your health care provider understand your overall risk for other conditions. How is this treated? This condition is treated by making healthy lifestyle changes, such as eating healthy foods, exercising more, and reducing your alcohol intake. Your health care provider may prescribe medicine if lifestyle changes are not enough to get your blood pressure under control, and if:  Your systolic blood pressure is above 130.  Your diastolic blood pressure is above 80. Your personal target blood pressure may vary depending on your medical conditions, your age, and other factors. Follow these instructions at home: Eating and drinking   Eat a diet that is high in fiber and potassium, and low in sodium, added sugar, and fat. An example eating plan is called the DASH (Dietary Approaches to Stop Hypertension) diet. To eat this way: ? Eat plenty of fresh fruits and vegetables. Try to fill one half of your plate at each meal with fruits and vegetables. ? Eat whole grains, such as whole-wheat pasta, brown rice, or whole-grain bread. Fill about one fourth of your plate with whole grains. ? Eat or drink low-fat dairy products, such as skim milk or low-fat yogurt. ? Avoid fatty cuts of meat, processed or cured meats, and poultry with skin. Fill about one fourth of your plate with lean proteins, such  as fish, chicken without skin, beans, eggs, or tofu. ? Avoid pre-made and processed foods. These tend to be higher in sodium, added sugar, and fat.  Reduce your daily sodium intake. Most people with hypertension should eat less than 1,500 mg of sodium a day.  Do not drink alcohol if: ? Your health care provider tells you not to drink. ? You are pregnant, may be pregnant, or are planning to become pregnant.  If you drink alcohol: ? Limit how much you use to:  0-1 drink a day for women.  0-2 drinks a day for men. ? Be aware of how much alcohol is in your drink. In the [place] one drink equals one 12 oz bottle of beer (355 mL), one 5 oz glass of wine (148 mL), or one 1 oz glass of hard liquor (44 mL). Lifestyle   Work with your health care provider to maintain a healthy body weight or to lose weight. Ask what an ideal weight is for you.  Get at least 30 minutes of exercise most days of the week. Activities may include walking, swimming, or   biking.  Include exercise to strengthen your muscles (resistance exercise), such as Pilates or lifting weights, as part of your weekly exercise routine. Try to do these types of exercises for 30 minutes at least 3 days a week.  Do not use any products that contain nicotine or tobacco, such as cigarettes, e-cigarettes, and chewing tobacco. If you need help quitting, ask your health care provider.  Monitor your blood pressure at home as told by your health care provider.  Keep all follow-up visits as told by your health care provider. This is important. Medicines  Take over-the-counter and prescription medicines only as told by your health care provider. Follow directions carefully. Blood pressure medicines must be taken as prescribed.  Do not skip doses of blood pressure medicine. Doing this puts you at risk for problems and can make the medicine less effective.  Ask your health care provider about side effects or reactions to medicines that you  should watch for. Contact a health care provider if you:  Think you are having a reaction to a medicine you are taking.  Have headaches that keep coming back (recurring).  Feel dizzy.  Have swelling in your ankles.  Have trouble with your vision. Get help right away if you:  Develop a severe headache or confusion.  Have unusual weakness or numbness.  Feel faint.  Have severe pain in your chest or abdomen.  Vomit repeatedly.  Have trouble breathing. Summary  Hypertension is when the force of blood pumping through your arteries is too strong. If this condition is not controlled, it may put you at risk for serious complications.  Your personal target blood pressure may vary depending on your medical conditions, your age, and other factors. For most people, a normal blood pressure is less than 120/80.  Hypertension is treated with lifestyle changes, medicines, or a combination of both. Lifestyle changes include losing weight, eating a healthy, low-sodium diet, exercising more, and limiting alcohol. This information is not intended to replace advice given to you by your health care provider. Make sure you discuss any questions you have with your health care provider. Document Released: 06/05/2005 Document Revised: 02/13/2018 Document Reviewed: 02/13/2018 Elsevier Patient Education  2020 [employer]  

## 2019-01-07 NOTE — Progress Notes (Signed)
[name] Genson 63 y.o.   Chief Complaint  Patient presents with  . Establish Care    self pay  . Hypertension    pt states he has not had any bp medication and would like to start back taking it. pt states that his heart fills like it s beating faster than normal    HISTORY OF PRESENT ILLNESS: This is a 63 y.o. male with history of hypertension but off medication for several months.  Wants to establish care with me and get back on his medications.  Presently patient does not have medical insurance and does not not wish to have blood work today.  Denies chest pain or difficulty breathing on exertion however has had some palpitations periodically without lightheadedness or syncope.  Non-smoker.  Retired.  No EtOH abuse.  No other complaints or medical concerns today.  HPI   Prior to Admission medications   Medication Sig Start Date End Date Taking? Authorizing Provider  amLODipine (NORVASC) 10 MG tablet Take 1 tablet (10 mg total) by mouth daily. 03/08/15  Yes Dorna LeitzBush, Nicole V, PA-C  lisinopril-hydrochlorothiazide (ZESTORETIC) 20-12.5 MG per tablet Take 1 tablet by mouth daily. 03/08/15  Yes Dorna LeitzBush, Nicole V, PA-C  aspirin 325 MG tablet Take 325 mg by mouth daily as needed (chest pain.).    [provider]  aspirin 81 MG chewable tablet Chew 81 mg by mouth daily.    [provider]  cephALEXin (KEFLEX) 500 MG capsule Take 1 capsule (500 mg total) by mouth 4 (four) times daily. Patient not taking: Reported on 01/07/2019 12/17/17   Elson AreasSofia, Leslie K, PA-C  Clobetasol Propionate 0.05 % lotion Apply 1 application topically 2 (two) times daily. Apply to rash avoiding face, genitals and folds of skin Patient not taking: Reported on 01/07/2019 05/17/16   Muthersbaugh, Dahlia ClientHannah, PA-C  diphenhydrAMINE (BENADRYL) 25 MG tablet Take 1 tablet (25 mg total) by mouth every 6 (six) hours. Patient not taking: Reported on 01/07/2019 07/21/15   Elpidio AnisUpstill, Shari, PA-C  famotidine (PEPCID) 20 MG tablet  Take 1 tablet (20 mg total) by mouth 2 (two) times daily. Patient not taking: Reported on 01/07/2019 06/28/15   Jaynie CrumbleKirichenko, Tatyana, PA-C  HYDROcodone-acetaminophen (NORCO/VICODIN) 5-325 MG tablet Take 2 tablets by mouth every 4 (four) hours as needed. Patient not taking: Reported on 01/07/2019 12/17/17   Elson AreasSofia, Leslie K, PA-C  hydrOXYzine (ATARAX/VISTARIL) 25 MG tablet Take 1 tablet (25 mg total) by mouth every 8 (eight) hours as needed for itching. Patient not taking: Reported on 01/07/2019 05/17/16   Muthersbaugh, Dahlia ClientHannah, PA-C  NONFORMULARY OR COMPOUNDED ITEM Triamcinolone 0.1%/Eucerin. Unknown ratio.  1 application topically daily as directed. 05/30/15   [provider]  predniSONE (DELTASONE) 20 MG tablet Take 2 tablets (40 mg total) by mouth daily. Patient not taking: Reported on 01/07/2019 11/20/15   Benjiman CorePickering, Nathan, MD  sucralfate (CARAFATE) 1 g tablet Take 1 tablet (1 g total) by mouth 4 (four) times daily -  with meals and at bedtime. Patient not taking: Reported on 01/07/2019 06/28/15   Jaynie CrumbleKirichenko, Tatyana, PA-C    No Known Allergies  Patient Active Problem List   Diagnosis Date Noted  . Hypertension 05/09/2012    Past Medical History:  Diagnosis Date  . Hypertension     History reviewed. No pertinent surgical history.  Social History   Socioeconomic History  . Marital status: Married    Spouse name: Not on file  . Number of children: Not on file  . Years of education:  Not on file  . Highest education level: Not on file  Occupational History  . Not on file  Social Needs  . Financial resource strain: Not on file  . Food insecurity    Worry: Not on file    Inability: Not on file  . Transportation needs    Medical: Not on file    Non-medical: Not on file  Tobacco Use  . Smoking status: Former Games developermoker  . Smokeless tobacco: Never Used  Substance and Sexual Activity  . Alcohol use: No    Alcohol/week: 0.0 standard drinks  . Drug use: No  . Sexual activity: Not on  file  Lifestyle  . Physical activity    Days per week: Not on file    Minutes per session: Not on file  . Stress: Not on file  Relationships  . Social Musicianconnections    Talks on phone: Not on file    Gets together: Not on file    Attends religious service: Not on file    Active member of club or organization: Not on file    Attends meetings of clubs or organizations: Not on file    Relationship status: Not on file  . Intimate partner violence    Fear of current or ex partner: Not on file    Emotionally abused: Not on file    Physically abused: Not on file    Forced sexual activity: Not on file  Other Topics Concern  . Not on file  Social History Narrative  . Not on file    Family History  Problem Relation Age of Onset  . Cancer Father   . Hypertension Sister   . Cancer Maternal Aunt   . Heart disease Paternal Uncle   . Cancer Maternal Uncle   . Cancer Maternal Uncle   . Cancer Paternal Uncle   . Cancer Paternal Uncle      Review of Systems  Constitutional: Negative.  Negative for chills and fever.  HENT: Negative.  Negative for congestion and sore throat.   Eyes: Negative.   Respiratory: Negative.  Negative for cough, hemoptysis and shortness of breath.   Cardiovascular: Positive for palpitations. Negative for chest pain, claudication and leg swelling.  Gastrointestinal: Negative.  Negative for abdominal pain, diarrhea, nausea and vomiting.  Genitourinary: Negative.  Negative for dysuria and hematuria.  Musculoskeletal: Negative.  Negative for back pain, myalgias and neck pain.  Skin: Negative.  Negative for rash.  Neurological: Negative.  Negative for dizziness and headaches.  Endo/Heme/Allergies: Negative.   All other systems reviewed and are negative.  Vitals:   01/07/19 1405  BP: (!) 168/94  Pulse: 100  Resp: 16  Temp: 99.1 F (37.3 C)  SpO2: 99%     Physical Exam Vitals signs reviewed.  Constitutional:      Appearance: Normal appearance.  HENT:      Head: Normocephalic and atraumatic.  Eyes:     Extraocular Movements: Extraocular movements intact.     Conjunctiva/sclera: Conjunctivae normal.     Pupils: Pupils are equal, round, and reactive to light.  Cardiovascular:     Rate and Rhythm: Normal rate and regular rhythm.     Pulses: Normal pulses.     Heart sounds: Normal heart sounds.  Pulmonary:     Effort: Pulmonary effort is normal.     Breath sounds: Normal breath sounds.  Musculoskeletal: Normal range of motion.  Skin:    General: Skin is warm and dry.     Capillary Refill:  Capillary refill takes less than 2 seconds.  Neurological:     General: No focal deficit present.     Mental Status: He is alert and oriented to person, place, and time.  Psychiatric:        Mood and Affect: Mood normal.        Behavior: Behavior normal.      ASSESSMENT & PLAN: Colin SeniorStephen was seen today for establish care and hypertension.  Diagnoses and all orders for this visit:  Essential hypertension -     amLODipine (NORVASC) 10 MG tablet; Take 1 tablet (10 mg total) by mouth daily. -     lisinopril-hydrochlorothiazide (ZESTORETIC) 20-12.5 MG tablet; Take 1 tablet by mouth daily.    Patient Instructions       If you have lab work done today you will be contacted with your lab results within the next 2 weeks.  If you have not heard from us then please contact us. The fastest way to get your results is to register for My Chart.   IF you received an x-ray today, you will receive an invoice from [facility]. Please contact [facility] at [phone] with questions or concerns regarding your invoice.   IF you received labwork today, you will receive an invoice from [facility]. Please contact [facility] at [phone] with questions or concerns regarding your invoice.   Our billing staff will not be able to assist you with questions regarding bills from these companies.  You will be contacted with the lab results as soon as  they are available. The fastest way to get your results is to activate your My Chart account. Instructions are located on the last page of this paperwork. If you have not heard from us regarding the results in 2 weeks, please contact this office.      Hypertension, Adult High blood pressure (hypertension) is when the force of blood pumping through the arteries is too strong. The arteries are the blood vessels that carry blood from the heart throughout the body. Hypertension forces the heart to work harder to pump blood and may cause arteries to become narrow or stiff. Untreated or uncontrolled hypertension can cause a heart attack, heart failure, a stroke, kidney disease, and other problems. A blood pressure reading consists of a higher number over a lower number. Ideally, your blood pressure should be below 120/80. The first ("top") number is called the systolic pressure. It is a measure of the pressure in your arteries as your heart beats. The second ("bottom") number is called the diastolic pressure. It is a measure of the pressure in your arteries as the heart relaxes. What are the causes? The exact cause of this condition is not known. There are some conditions that result in or are related to high blood pressure. What increases the risk? Some risk factors for high blood pressure are under your control. The following factors may make you more likely to develop this condition:  Smoking.  Having type 2 diabetes mellitus, high cholesterol, or both.  Not getting enough exercise or physical activity.  Being overweight.  Having too much fat, sugar, calories, or salt (sodium) in your diet.  Drinking too much alcohol. Some risk factors for high blood pressure may be difficult or impossible to change. Some of these factors include:  Having chronic kidney disease.  Having a family history of high blood pressure.  Age. Risk increases with age.  Race. You may be at higher risk if you are  African American.  Gender. Men are at higher risk than women before age 93. After age 67, women are at higher risk than men.  Having obstructive sleep apnea.  Stress. What are the signs or symptoms? High blood pressure may not cause symptoms. Very high blood pressure (hypertensive crisis) may cause:  Headache.  Anxiety.  Shortness of breath.  Nosebleed.  Nausea and vomiting.  Vision changes.  Severe chest pain.  Seizures. How is this diagnosed? This condition is diagnosed by measuring your blood pressure while you are seated, with your arm resting on a flat surface, your legs uncrossed, and your feet flat on the floor. The cuff of the blood pressure monitor will be placed directly against the skin of your upper arm at the level of your heart. It should be measured at least twice using the same arm. Certain conditions can cause a difference in blood pressure between your right and left arms. Certain factors can cause blood pressure readings to be lower or higher than normal for a short period of time:  When your blood pressure is higher when you are in a health care provider's office than when you are at home, this is called white coat hypertension. Most people with this condition do not need medicines.  When your blood pressure is higher at home than when you are in a health care provider's office, this is called masked hypertension. Most people with this condition may need medicines to control blood pressure. If you have a high blood pressure reading during one visit or you have normal blood pressure with other risk factors, you may be asked to:  Return on a different day to have your blood pressure checked again.  Monitor your blood pressure at home for 1 week or longer. If you are diagnosed with hypertension, you may have other blood or imaging tests to help your health care provider understand your overall risk for other conditions. How is this treated? This condition is  treated by making healthy lifestyle changes, such as eating healthy foods, exercising more, and reducing your alcohol intake. Your health care provider may prescribe medicine if lifestyle changes are not enough to get your blood pressure under control, and if:  Your systolic blood pressure is above 130.  Your diastolic blood pressure is above 80. Your personal target blood pressure may vary depending on your medical conditions, your age, and other factors. Follow these instructions at home: Eating and drinking   Eat a diet that is high in fiber and potassium, and low in sodium, added sugar, and fat. An example eating plan is called the DASH (Dietary Approaches to Stop Hypertension) diet. To eat this way: ? Eat plenty of fresh fruits and vegetables. Try to fill one half of your plate at each meal with fruits and vegetables. ? Eat whole grains, such as whole-wheat pasta, brown rice, or whole-grain bread. Fill about one fourth of your plate with whole grains. ? Eat or drink low-fat dairy products, such as skim milk or low-fat yogurt. ? Avoid fatty cuts of meat, processed or cured meats, and poultry with skin. Fill about one fourth of your plate with lean proteins, such as fish, chicken without skin, beans, eggs, or tofu. ? Avoid pre-made and processed foods. These tend to be higher in sodium, added sugar, and fat.  Reduce your daily sodium intake. Most people with hypertension should eat less than 1,500 mg of sodium a day.  Do not drink alcohol if: ? Your health care provider tells you not  to drink. ? You are pregnant, may be pregnant, or are planning to become pregnant.  If you drink alcohol: ? Limit how much you use to:  0-1 drink a day for women.  0-2 drinks a day for men. ? Be aware of how much alcohol is in your drink. In the [place] one drink equals one 12 oz bottle of beer (355 mL), one 5 oz glass of wine (148 mL), or one 1 oz glass of hard liquor (44 mL). Lifestyle   Work with  your health care provider to maintain a healthy body weight or to lose weight. Ask what an ideal weight is for you.  Get at least 30 minutes of exercise most days of the week. Activities may include walking, swimming, or biking.  Include exercise to strengthen your muscles (resistance exercise), such as Pilates or lifting weights, as part of your weekly exercise routine. Try to do these types of exercises for 30 minutes at least 3 days a week.  Do not use any products that contain nicotine or tobacco, such as cigarettes, e-cigarettes, and chewing tobacco. If you need help quitting, ask your health care provider.  Monitor your blood pressure at home as told by your health care provider.  Keep all follow-up visits as told by your health care provider. This is important. Medicines  Take over-the-counter and prescription medicines only as told by your health care provider. Follow directions carefully. Blood pressure medicines must be taken as prescribed.  Do not skip doses of blood pressure medicine. Doing this puts you at risk for problems and can make the medicine less effective.  Ask your health care provider about side effects or reactions to medicines that you should watch for. Contact a health care provider if you:  Think you are having a reaction to a medicine you are taking.  Have headaches that keep coming back (recurring).  Feel dizzy.  Have swelling in your ankles.  Have trouble with your vision. Get help right away if you:  Develop a severe headache or confusion.  Have unusual weakness or numbness.  Feel faint.  Have severe pain in your chest or abdomen.  Vomit repeatedly.  Have trouble breathing. Summary  Hypertension is when the force of blood pumping through your arteries is too strong. If this condition is not controlled, it may put you at risk for serious complications.  Your personal target blood pressure may vary depending on your medical conditions, your  age, and other factors. For most people, a normal blood pressure is less than 120/80.  Hypertension is treated with lifestyle changes, medicines, or a combination of both. Lifestyle changes include losing weight, eating a healthy, low-sodium diet, exercising more, and limiting alcohol. This information is not intended to replace advice given to you by your health care provider. Make sure you discuss any questions you have with your health care provider. Document Released: 06/05/2005 Document Revised: 02/13/2018 Document Reviewed: 02/13/2018 Elsevier Patient Education  2020 [employer]      Agustina Caroli, MD [facility]

## 2019-07-09 ENCOUNTER — Ambulatory Visit: Payer: Self-pay | Admitting: Emergency Medicine

## 2019-07-16 ENCOUNTER — Ambulatory Visit: Payer: Self-pay | Admitting: Emergency Medicine

## 2019-08-18 ENCOUNTER — Ambulatory Visit: Payer: Self-pay | Admitting: Emergency Medicine

## 2019-09-04 ENCOUNTER — Telehealth: Payer: Self-pay | Admitting: Emergency Medicine

## 2019-09-04 NOTE — Telephone Encounter (Signed)
Called patient to reschedule his appointment for 10/02/2019

## 2019-10-02 ENCOUNTER — Ambulatory Visit: Payer: Self-pay | Admitting: Emergency Medicine

## 2019-10-06 ENCOUNTER — Ambulatory Visit: Payer: Self-pay | Admitting: Emergency Medicine

## 2019-10-16 ENCOUNTER — Ambulatory Visit: Payer: Self-pay | Admitting: Emergency Medicine

## 2019-11-12 ENCOUNTER — Other Ambulatory Visit: Payer: Self-pay

## 2019-11-12 ENCOUNTER — Ambulatory Visit (INDEPENDENT_AMBULATORY_CARE_PROVIDER_SITE_OTHER): Payer: Self-pay | Admitting: Emergency Medicine

## 2019-11-12 ENCOUNTER — Encounter: Payer: Self-pay | Admitting: Emergency Medicine

## 2019-11-12 VITALS — BP 133/81 | HR 92 | Temp 97.9°F | Ht 67.0 in | Wt 190.0 lb

## 2019-11-12 DIAGNOSIS — L309 Dermatitis, unspecified: Secondary | ICD-10-CM

## 2019-11-12 DIAGNOSIS — I10 Essential (primary) hypertension: Secondary | ICD-10-CM

## 2019-11-12 MED ORDER — NONFORMULARY OR COMPOUNDED ITEM: 5 refills | Status: DC

## 2019-11-12 MED ORDER — LISINOPRIL-HYDROCHLOROTHIAZIDE 20-12.5 MG PO TABS: 1.0000 | ORAL_TABLET | Freq: Every day | ORAL | 3 refills | Status: DC

## 2019-11-12 MED ORDER — AMLODIPINE BESYLATE 10 MG PO TABS: 10.0000 mg | ORAL_TABLET | Freq: Every day | ORAL | 3 refills | Status: DC

## 2019-11-12 NOTE — Patient Instructions (Addendum)
   If you have lab work done today you will be contacted with your lab results within the next 2 weeks.  If you have not heard from us then please contact us. The fastest way to get your results is to register for My Chart.   IF you received an x-ray today, you will receive an invoice from [facility]. Please contact [facility] at [phone] with questions or concerns regarding your invoice.   IF you received labwork today, you will receive an invoice from [facility]. Please contact [facility] at [phone] with questions or concerns regarding your invoice.   Our billing staff will not be able to assist you with questions regarding bills from these companies.  You will be contacted with the lab results as soon as they are available. The fastest way to get your results is to activate your My Chart account. Instructions are located on the last page of this paperwork. If you have not heard from us regarding the results in 2 weeks, please contact this office.      Hypertension, Adult High blood pressure (hypertension) is when the force of blood pumping through the arteries is too strong. The arteries are the blood vessels that carry blood from the heart throughout the body. Hypertension forces the heart to work harder to pump blood and may cause arteries to become narrow or stiff. Untreated or uncontrolled hypertension can cause a heart attack, heart failure, a stroke, kidney disease, and other problems. A blood pressure reading consists of a higher number over a lower number. Ideally, your blood pressure should be below 120/80. The first ("top") number is called the systolic pressure. It is a measure of the pressure in your arteries as your heart beats. The second ("bottom") number is called the diastolic pressure. It is a measure of the pressure in your arteries as the heart relaxes. What are the causes? The exact cause of this condition is not known. There are some conditions  that result in or are related to high blood pressure. What increases the risk? Some risk factors for high blood pressure are under your control. The following factors may make you more likely to develop this condition:  Smoking.  Having type 2 diabetes mellitus, high cholesterol, or both.  Not getting enough exercise or physical activity.  Being overweight.  Having too much fat, sugar, calories, or salt (sodium) in your diet.  Drinking too much alcohol. Some risk factors for high blood pressure may be difficult or impossible to change. Some of these factors include:  Having chronic kidney disease.  Having a family history of high blood pressure.  Age. Risk increases with age.  Race. You may be at higher risk if you are African American.  Gender. Men are at higher risk than women before age 45. After age 65, women are at higher risk than men.  Having obstructive sleep apnea.  Stress. What are the signs or symptoms? High blood pressure may not cause symptoms. Very high blood pressure (hypertensive crisis) may cause:  Headache.  Anxiety.  Shortness of breath.  Nosebleed.  Nausea and vomiting.  Vision changes.  Severe chest pain.  Seizures. How is this diagnosed? This condition is diagnosed by measuring your blood pressure while you are seated, with your arm resting on a flat surface, your legs uncrossed, and your feet flat on the floor. The cuff of the blood pressure monitor will be placed directly against the skin of your upper arm at the level of your   heart. It should be measured at least twice using the same arm. Certain conditions can cause a difference in blood pressure between your right and left arms. Certain factors can cause blood pressure readings to be lower or higher than normal for a short period of time:  When your blood pressure is higher when you are in a health care provider's office than when you are at home, this is called white coat hypertension.  Most people with this condition do not need medicines.  When your blood pressure is higher at home than when you are in a health care provider's office, this is called masked hypertension. Most people with this condition may need medicines to control blood pressure. If you have a high blood pressure reading during one visit or you have normal blood pressure with other risk factors, you may be asked to:  Return on a different day to have your blood pressure checked again.  Monitor your blood pressure at home for 1 week or longer. If you are diagnosed with hypertension, you may have other blood or imaging tests to help your health care provider understand your overall risk for other conditions. How is this treated? This condition is treated by making healthy lifestyle changes, such as eating healthy foods, exercising more, and reducing your alcohol intake. Your health care provider may prescribe medicine if lifestyle changes are not enough to get your blood pressure under control, and if:  Your systolic blood pressure is above 130.  Your diastolic blood pressure is above 80. Your personal target blood pressure may vary depending on your medical conditions, your age, and other factors. Follow these instructions at home: Eating and drinking   Eat a diet that is high in fiber and potassium, and low in sodium, added sugar, and fat. An example eating plan is called the DASH (Dietary Approaches to Stop Hypertension) diet. To eat this way: ? Eat plenty of fresh fruits and vegetables. Try to fill one half of your plate at each meal with fruits and vegetables. ? Eat whole grains, such as whole-wheat pasta, brown rice, or whole-grain bread. Fill about one fourth of your plate with whole grains. ? Eat or drink low-fat dairy products, such as skim milk or low-fat yogurt. ? Avoid fatty cuts of meat, processed or cured meats, and poultry with skin. Fill about one fourth of your plate with lean proteins, such  as fish, chicken without skin, beans, eggs, or tofu. ? Avoid pre-made and processed foods. These tend to be higher in sodium, added sugar, and fat.  Reduce your daily sodium intake. Most people with hypertension should eat less than 1,500 mg of sodium a day.  Do not drink alcohol if: ? Your health care provider tells you not to drink. ? You are pregnant, may be pregnant, or are planning to become pregnant.  If you drink alcohol: ? Limit how much you use to:  0-1 drink a day for women.  0-2 drinks a day for men. ? Be aware of how much alcohol is in your drink. In the [place] one drink equals one 12 oz bottle of beer (355 mL), one 5 oz glass of wine (148 mL), or one 1 oz glass of hard liquor (44 mL). Lifestyle   Work with your health care provider to maintain a healthy body weight or to lose weight. Ask what an ideal weight is for you.  Get at least 30 minutes of exercise most days of the week. Activities may include walking, swimming,   or biking.  Include exercise to strengthen your muscles (resistance exercise), such as Pilates or lifting weights, as part of your weekly exercise routine. Try to do these types of exercises for 30 minutes at least 3 days a week.  Do not use any products that contain nicotine or tobacco, such as cigarettes, e-cigarettes, and chewing tobacco. If you need help quitting, ask your health care provider.  Monitor your blood pressure at home as told by your health care provider.  Keep all follow-up visits as told by your health care provider. This is important. Medicines  Take over-the-counter and prescription medicines only as told by your health care provider. Follow directions carefully. Blood pressure medicines must be taken as prescribed.  Do not skip doses of blood pressure medicine. Doing this puts you at risk for problems and can make the medicine less effective.  Ask your health care provider about side effects or reactions to medicines that you  should watch for. Contact a health care provider if you:  Think you are having a reaction to a medicine you are taking.  Have headaches that keep coming back (recurring).  Feel dizzy.  Have swelling in your ankles.  Have trouble with your vision. Get help right away if you:  Develop a severe headache or confusion.  Have unusual weakness or numbness.  Feel faint.  Have severe pain in your chest or abdomen.  Vomit repeatedly.  Have trouble breathing. Summary  Hypertension is when the force of blood pumping through your arteries is too strong. If this condition is not controlled, it may put you at risk for serious complications.  Your personal target blood pressure may vary depending on your medical conditions, your age, and other factors. For most people, a normal blood pressure is less than 120/80.  Hypertension is treated with lifestyle changes, medicines, or a combination of both. Lifestyle changes include losing weight, eating a healthy, low-sodium diet, exercising more, and limiting alcohol. This information is not intended to replace advice given to you by your health care provider. Make sure you discuss any questions you have with your health care provider. Document Revised: 02/13/2018 Document Reviewed: 02/13/2018 Elsevier Patient Education  2020 Elsevier Inc.  

## 2019-11-12 NOTE — Progress Notes (Signed)
Colin Pena 64 y.o.   Chief Complaint  Patient presents with  . Hypertension    med refill 6 m f/u   . Eczema    wants cream from [facility] acouple years ago     HISTORY OF PRESENT ILLNESS: This is a 64 y.o. male with history of hypertension here for follow-up.  Presently on amlodipine 10 mg daily and Zestoretic 20-12.5 mg daily. Also has history of chronic eczema, requesting refill on previous topical cream that was originally prescribed by dermatologist. No other complaints or medical concerns today. Fully vaccinated against Covid.  HPI   Prior to Admission medications   Medication Sig Start Date End Date Taking? Authorizing Provider  amLODipine (NORVASC) 10 MG tablet Take 1 tablet (10 mg total) by mouth daily. 01/07/19  Yes SagardiaEilleen Kempf, MD  aspirin 81 MG chewable tablet Chew 81 mg by mouth daily.   Yes [provider]  lisinopril-hydrochlorothiazide (ZESTORETIC) 20-12.5 MG tablet Take 1 tablet by mouth daily. 01/07/19  Yes SagardiaEilleen Kempf, MD  NONFORMULARY OR COMPOUNDED ITEM Triamcinolone 0.1%/Eucerin. Unknown ratio.  1 application topically daily as directed. 05/30/15  Yes [provider]    No Known Allergies  Patient Active Problem List   Diagnosis Date Noted  . Hypertension 05/09/2012    Past Medical History:  Diagnosis Date  . Hypertension     History reviewed. No pertinent surgical history.  Social History   Socioeconomic History  . Marital status: Married    Spouse name: Not on file  . Number of children: Not on file  . Years of education: Not on file  . Highest education level: Not on file  Occupational History  . Not on file  Tobacco Use  . Smoking status: Former Games developer  . Smokeless tobacco: Never Used  Substance and Sexual Activity  . Alcohol use: No    Alcohol/week: 0.0 standard drinks  . Drug use: No  . Sexual activity: Not on file  Other Topics Concern  . Not on file  Social History Narrative  . Not on  file   Social Determinants of Health   Financial Resource Strain:   . Difficulty of Paying Living Expenses:   Food Insecurity:   . Worried About Programme researcher, broadcasting/film/video in the Last Year:   . Barista in the Last Year:   Transportation Needs:   . Freight forwarder (Medical):   Marland Kitchen Lack of Transportation (Non-Medical):   Physical Activity:   . Days of Exercise per Week:   . Minutes of Exercise per Session:   Stress:   . Feeling of Stress :   Social Connections:   . Frequency of Communication with Friends and Family:   . Frequency of Social Gatherings with Friends and Family:   . Attends Religious Services:   . Active Member of Clubs or Organizations:   . Attends Banker Meetings:   Marland Kitchen Marital Status:   Intimate Partner Violence:   . Fear of Current or Ex-Partner:   . Emotionally Abused:   Marland Kitchen Physically Abused:   . Sexually Abused:     Family History  Problem Relation Age of Onset  . Cancer Father   . Hypertension Sister   . Cancer Maternal Aunt   . Heart disease Paternal Uncle   . Cancer Maternal Uncle   . Cancer Maternal Uncle   . Cancer Paternal Uncle   . Cancer Paternal Uncle      Review of Systems  Constitutional: Negative.  Negative for chills and fever.  HENT: Negative.   Respiratory: Negative.  Negative for cough and shortness of breath.   Cardiovascular: Negative.  Negative for chest pain and palpitations.  Gastrointestinal: Negative.  Negative for abdominal pain, diarrhea, nausea and vomiting.  Genitourinary: Negative for dysuria.  Skin: Positive for rash (Chronic eczema).  Neurological: Negative for dizziness and headaches.  All other systems reviewed and are negative.  Vitals:   11/12/19 0955  BP: 133/81  Pulse: 92  Temp: 97.9 F (36.6 C)  SpO2: 96%     Physical Exam Vitals reviewed.  Constitutional:      Appearance: Normal appearance.  HENT:     Head: Normocephalic.  Eyes:     Extraocular Movements: Extraocular  movements intact.     Conjunctiva/sclera: Conjunctivae normal.     Pupils: Pupils are equal, round, and reactive to light.  Cardiovascular:     Rate and Rhythm: Normal rate and regular rhythm.     Pulses: Normal pulses.     Heart sounds: Normal heart sounds.  Pulmonary:     Effort: Pulmonary effort is normal.     Breath sounds: Normal breath sounds.  Musculoskeletal:        General: Normal range of motion.     Cervical back: Normal range of motion and neck supple.     Right lower leg: No edema.     Left lower leg: No edema.  Skin:    General: Skin is warm and dry.     Capillary Refill: Capillary refill takes less than 2 seconds.     Findings: Rash (Chronic eczema mostly to his back) present.  Neurological:     General: No focal deficit present.     Mental Status: He is alert and oriented to person, place, and time.  Psychiatric:        Mood and Affect: Mood normal.        Behavior: Behavior normal.      ASSESSMENT & PLAN: Colin Pena was seen today for hypertension and eczema.  Diagnoses and all orders for this visit:  Essential hypertension -     amLODipine (NORVASC) 10 MG tablet; Take 1 tablet (10 mg total) by mouth daily. -     lisinopril-hydrochlorothiazide (ZESTORETIC) 20-12.5 MG tablet; Take 1 tablet by mouth daily.  Eczema, unspecified type -     NONFORMULARY OR COMPOUNDED ITEM; Triamcinolone 0.1%/Eucerin. Unknown ratio.1 application topically daily as directed.    Patient Instructions       If you have lab work done today you will be contacted with your lab results within the next 2 weeks.  If you have not heard from [place] then please contact us. The fastest way to get your results is to register for My Chart.   IF you received an x-ray today, you will receive an invoice from [facility]. Please contact [facility] at [phone] with questions or concerns regarding your invoice.   IF you received labwork today, you will receive an invoice  from [facility]. Please contact [facility] at [phone] with questions or concerns regarding your invoice.   Our billing staff will not be able to assist you with questions regarding bills from these companies.  You will be contacted with the lab results as soon as they are available. The fastest way to get your results is to activate your My Chart account. Instructions are located on the last page of this paperwork. If you have not heard from [place] regarding the results in  2 weeks, please contact this office.      Hypertension, Adult High blood pressure (hypertension) is when the force of blood pumping through the arteries is too strong. The arteries are the blood vessels that carry blood from the heart throughout the body. Hypertension forces the heart to work harder to pump blood and may cause arteries to become narrow or stiff. Untreated or uncontrolled hypertension can cause a heart attack, heart failure, a stroke, kidney disease, and other problems. A blood pressure reading consists of a higher number over a lower number. Ideally, your blood pressure should be below 120/80. The first ("top") number is called the systolic pressure. It is a measure of the pressure in your arteries as your heart beats. The second ("bottom") number is called the diastolic pressure. It is a measure of the pressure in your arteries as the heart relaxes. What are the causes? The exact cause of this condition is not known. There are some conditions that result in or are related to high blood pressure. What increases the risk? Some risk factors for high blood pressure are under your control. The following factors may make you more likely to develop this condition:  Smoking.  Having type 2 diabetes mellitus, high cholesterol, or both.  Not getting enough exercise or physical activity.  Being overweight.  Having too much fat, sugar, calories, or salt (sodium) in your diet.  Drinking too much alcohol. Some risk  factors for high blood pressure may be difficult or impossible to change. Some of these factors include:  Having chronic kidney disease.  Having a family history of high blood pressure.  Age. Risk increases with age.  Race. You may be at higher risk if you are African American.  Gender. Men are at higher risk than women before age 77. After age 52, women are at higher risk than men.  Having obstructive sleep apnea.  Stress. What are the signs or symptoms? High blood pressure may not cause symptoms. Very high blood pressure (hypertensive crisis) may cause:  Headache.  Anxiety.  Shortness of breath.  Nosebleed.  Nausea and vomiting.  Vision changes.  Severe chest pain.  Seizures. How is this diagnosed? This condition is diagnosed by measuring your blood pressure while you are seated, with your arm resting on a flat surface, your legs uncrossed, and your feet flat on the floor. The cuff of the blood pressure monitor will be placed directly against the skin of your upper arm at the level of your heart. It should be measured at least twice using the same arm. Certain conditions can cause a difference in blood pressure between your right and left arms. Certain factors can cause blood pressure readings to be lower or higher than normal for a short period of time:  When your blood pressure is higher when you are in a health care provider's office than when you are at home, this is called white coat hypertension. Most people with this condition do not need medicines.  When your blood pressure is higher at home than when you are in a health care provider's office, this is called masked hypertension. Most people with this condition may need medicines to control blood pressure. If you have a high blood pressure reading during one visit or you have normal blood pressure with other risk factors, you may be asked to:  Return on a different day to have your blood pressure checked  again.  Monitor your blood pressure at home for 1 week or longer.  If you are diagnosed with hypertension, you may have other blood or imaging tests to help your health care provider understand your overall risk for other conditions. How is this treated? This condition is treated by making healthy lifestyle changes, such as eating healthy foods, exercising more, and reducing your alcohol intake. Your health care provider may prescribe medicine if lifestyle changes are not enough to get your blood pressure under control, and if:  Your systolic blood pressure is above 130.  Your diastolic blood pressure is above 80. Your personal target blood pressure may vary depending on your medical conditions, your age, and other factors. Follow these instructions at home: Eating and drinking   Eat a diet that is high in fiber and potassium, and low in sodium, added sugar, and fat. An example eating plan is called the DASH (Dietary Approaches to Stop Hypertension) diet. To eat this way: ? Eat plenty of fresh fruits and vegetables. Try to fill one half of your plate at each meal with fruits and vegetables. ? Eat whole grains, such as whole-wheat pasta, brown rice, or whole-grain bread. Fill about one fourth of your plate with whole grains. ? Eat or drink low-fat dairy products, such as skim milk or low-fat yogurt. ? Avoid fatty cuts of meat, processed or cured meats, and poultry with skin. Fill about one fourth of your plate with lean proteins, such as fish, chicken without skin, beans, eggs, or tofu. ? Avoid pre-made and processed foods. These tend to be higher in sodium, added sugar, and fat.  Reduce your daily sodium intake. Most people with hypertension should eat less than 1,500 mg of sodium a day.  Do not drink alcohol if: ? Your health care provider tells you not to drink. ? You are pregnant, may be pregnant, or are planning to become pregnant.  If you drink alcohol: ? Limit how much you use  to:  0-1 drink a day for women.  0-2 drinks a day for men. ? Be aware of how much alcohol is in your drink. In the [place] one drink equals one 12 oz bottle of beer (355 mL), one 5 oz glass of wine (148 mL), or one 1 oz glass of hard liquor (44 mL). Lifestyle   Work with your health care provider to maintain a healthy body weight or to lose weight. Ask what an ideal weight is for you.  Get at least 30 minutes of exercise most days of the week. Activities may include walking, swimming, or biking.  Include exercise to strengthen your muscles (resistance exercise), such as Pilates or lifting weights, as part of your weekly exercise routine. Try to do these types of exercises for 30 minutes at least 3 days a week.  Do not use any products that contain nicotine or tobacco, such as cigarettes, e-cigarettes, and chewing tobacco. If you need help quitting, ask your health care provider.  Monitor your blood pressure at home as told by your health care provider.  Keep all follow-up visits as told by your health care provider. This is important. Medicines  Take over-the-counter and prescription medicines only as told by your health care provider. Follow directions carefully. Blood pressure medicines must be taken as prescribed.  Do not skip doses of blood pressure medicine. Doing this puts you at risk for problems and can make the medicine less effective.  Ask your health care provider about side effects or reactions to medicines that you should watch for. Contact a health care provider if you:  Think you are having a reaction to a medicine you are taking.  Have headaches that keep coming back (recurring).  Feel dizzy.  Have swelling in your ankles.  Have trouble with your vision. Get help right away if you:  Develop a severe headache or confusion.  Have unusual weakness or numbness.  Feel faint.  Have severe pain in your chest or abdomen.  Vomit repeatedly.  Have trouble  breathing. Summary  Hypertension is when the force of blood pumping through your arteries is too strong. If this condition is not controlled, it may put you at risk for serious complications.  Your personal target blood pressure may vary depending on your medical conditions, your age, and other factors. For most people, a normal blood pressure is less than 120/80.  Hypertension is treated with lifestyle changes, medicines, or a combination of both. Lifestyle changes include losing weight, eating a healthy, low-sodium diet, exercising more, and limiting alcohol. This information is not intended to replace advice given to you by your health care provider. Make sure you discuss any questions you have with your health care provider. Document Revised: 02/13/2018 Document Reviewed: 02/13/2018 Elsevier Patient Education  2020 Elsevier Inc.      Edwina Barth, MD [facility]

## 2019-11-14 ENCOUNTER — Telehealth: Payer: Self-pay | Admitting: Emergency Medicine

## 2019-11-14 NOTE — Telephone Encounter (Signed)
[facility] called and is wanting to know the ratio of this medication NONFORMULARY OR COMPOUNDED ITEM [[id number]]   Talked to [facility] and there was nothing in account note regarding this medication. Pharmacy is needing those instructions to fill the proscription. Please advise.

## 2019-11-14 NOTE — Telephone Encounter (Signed)
Pharmacy is requesting sig on the medication needed to be compounded

## 2019-11-14 NOTE — Telephone Encounter (Signed)
I have called the pharmacy and informed them that pt is able to get the even parts of the medication with the medium size bottle which is a tube of 250g.   Thanks for your time.

## 2019-11-15 NOTE — Telephone Encounter (Signed)
The original prescription was written by the dermatologist.  As a courtesy I sent a refill.  However I am not familiar with compounded medications.  I do not know the original ratio.  Patient has to get that from his dermatologist.  Thanks.

## 2019-11-18 NOTE — Telephone Encounter (Signed)
Noted. I have attempted to call the pt and there was no answer so I left a message to call back.   Plan is to ask if he has gotten his medication for the curtsy refill.

## 2020-03-30 DIAGNOSIS — Z7982 Long term (current) use of aspirin: Secondary | ICD-10-CM | POA: Insufficient documentation

## 2020-03-30 DIAGNOSIS — S51811A Laceration without foreign body of right forearm, initial encounter: Secondary | ICD-10-CM | POA: Insufficient documentation

## 2020-03-30 DIAGNOSIS — W1839XA Other fall on same level, initial encounter: Secondary | ICD-10-CM | POA: Insufficient documentation

## 2020-03-30 DIAGNOSIS — Z87891 Personal history of nicotine dependence: Secondary | ICD-10-CM | POA: Insufficient documentation

## 2020-03-30 DIAGNOSIS — Z79899 Other long term (current) drug therapy: Secondary | ICD-10-CM | POA: Insufficient documentation

## 2020-03-30 DIAGNOSIS — I10 Essential (primary) hypertension: Secondary | ICD-10-CM | POA: Insufficient documentation

## 2020-03-31 ENCOUNTER — Encounter (HOSPITAL_COMMUNITY): Payer: Self-pay | Admitting: Emergency Medicine

## 2020-03-31 ENCOUNTER — Emergency Department (HOSPITAL_COMMUNITY)
Admission: EM | Admit: 2020-03-31 | Discharge: 2020-03-31 | Disposition: A | Discharge status: Home / Self Care | Payer: Self-pay | Attending: Emergency Medicine | Admitting: Emergency Medicine

## 2020-03-31 ENCOUNTER — Other Ambulatory Visit: Payer: Self-pay

## 2020-03-31 DIAGNOSIS — S51819A Laceration without foreign body of unspecified forearm, initial encounter: Secondary | ICD-10-CM

## 2020-03-31 MED ORDER — DOXYCYCLINE HYCLATE 100 MG PO CAPS: 100.0000 mg | ORAL_CAPSULE | Freq: Two times a day (BID) | ORAL | 0 refills | Status: DC

## 2020-03-31 MED ORDER — SILVER SULFADIAZINE 1 % EX CREA
TOPICAL_CREAM | Freq: Two times a day (BID) | CUTANEOUS | Status: DC
  Filled 2020-03-31: qty 50

## 2020-03-31 MED ORDER — SILVER SULFADIAZINE 1 % EX CREA: TOPICAL_CREAM | Freq: Once | CUTANEOUS | Status: DC

## 2020-03-31 NOTE — ED Provider Notes (Signed)
[facility]-EMERGENCY DEPT Provider Note   CSN: [id number] Arrival date & time: 03/30/20  2341     History Chief Complaint  Patient presents with  . Fall  . Abrasion    Colin Pena is a 64 y.o. male.  Patient presents to the emergency department to have wounds on his right forearm evaluated.  Patient reports that he tripped 2 days ago and hit his right forearm on a piece of furniture.  He suffered 2 skin tears to the forearm.  He reports that he cleaned the area, laid the skin flat and put a dressing over it.  He noticed today that there was some seepage of fluid from one of the wounds and was concerned about possible infection.        Past Medical History:  Diagnosis Date  . Hypertension     Patient Active Problem List   Diagnosis Date Noted  . Hypertension 05/09/2012    History reviewed. No pertinent surgical history.     Family History  Problem Relation Age of Onset  . Cancer Father   . Hypertension Sister   . Cancer Maternal Aunt   . Heart disease Paternal Uncle   . Cancer Maternal Uncle   . Cancer Maternal Uncle   . Cancer Paternal Uncle   . Cancer Paternal Uncle     Social History   Tobacco Use  . Smoking status: Former Games developer  . Smokeless tobacco: Never Used  Substance Use Topics  . Alcohol use: No    Alcohol/week: 0.0 standard drinks  . Drug use: No    Home Medications Prior to Admission medications   Medication Sig Start Date End Date Taking? Authorizing Provider  amLODipine (NORVASC) 10 MG tablet Take 1 tablet (10 mg total) by mouth daily. 11/12/19   Georgina Quint, MD  aspirin 81 MG chewable tablet Chew 81 mg by mouth daily.    [provider]  doxycycline (VIBRAMYCIN) 100 MG capsule Take 1 capsule (100 mg total) by mouth 2 (two) times daily. 03/31/20   Gilda Crease, MD  lisinopril-hydrochlorothiazide (ZESTORETIC) 20-12.5 MG tablet Take 1 tablet by mouth daily. 11/12/19   Georgina Quint, MD  NONFORMULARY OR COMPOUNDED ITEM Triamcinolone 0.1%/Eucerin. Unknown ratio.1 application topically daily as directed. 11/12/19   Georgina Quint, MD    Allergies    Patient has no known allergies.  Review of Systems   Review of Systems  Musculoskeletal: Negative.   Skin: Positive for wound.    Physical Exam Updated Vital Signs BP (!) 129/96 (BP Location: Left Arm)   Pulse (!) 108   Temp 98.5 F (36.9 C) (Oral)   Resp 16   Ht 5\' 7"  (1.702 m)   Wt 86.2 kg   SpO2 100%   BMI 29.76 kg/m   Physical Exam Vitals and nursing note reviewed.  Constitutional:      Appearance: Normal appearance.  HENT:     Head: Atraumatic.  Musculoskeletal:     Right elbow: Normal.     Right forearm: Laceration and tenderness present. No swelling or edema.     Right wrist: Normal.  Skin:    Findings: Laceration present.     Comments: 2 skin tears over extensor aspect of mid forearm.  No drainage.  No erythema.  No induration.  No fluctuance.  Neurological:     Mental Status: He is alert.     ED Results / Procedures / Treatments   Labs (all labs ordered are  listed, but only abnormal results are displayed) Labs Reviewed - No data to display  EKG None  Radiology No results found.  Procedures Procedures (including critical care time)  Medications Ordered in ED Medications  silver sulfADIAZINE (SILVADENE) 1 % cream (has no administration in time range)    ED Course  I have reviewed the triage vital signs and the nursing notes.  Pertinent labs & imaging results that were available during my care of the patient were reviewed by me and considered in my medical decision making (see chart for details).    MDM Rules/Calculators/A&P                          Patient noted seepage of fluid and a small amount of bleeding from wounds on his forearm from a fall 2 days ago.  No clear indication of infection at this time.  Likely had some interstitial fluid seepage.  Will apply  Silvadene dressing and have him do twice daily dressing changes.  As he does have some devitalized tissue in the area we will give him a prescription for antibiotics.  He will not fill the prescription unless he sees signs of infection.  Return to the ER for any significant worsening.  Final Clinical Impression(s) / ED Diagnoses Final diagnoses:  Skin tear of forearm without complication, initial encounter    Rx / DC Orders ED Discharge Orders         Ordered    doxycycline (VIBRAMYCIN) 100 MG capsule  2 times daily        03/31/20 0025           Gilda Crease, MD 03/31/20 [phone]

## 2020-03-31 NOTE — ED Triage Notes (Signed)
Patient arrives s/p fall after tripping over a bag on the floor Sunday. Patient has two skin tears to the right arm that he said were draining and he was worried about them bleeding or being infected. Patient denies other symptoms.

## 2020-03-31 NOTE — Discharge Instructions (Addendum)
Perform twice a day dressing changes with the provided antibiotic cream.  If there is any redness, swelling or increased drainage start antibiotic.  If you get significant redness, swelling or fever, return to the ER.

## 2020-05-12 ENCOUNTER — Ambulatory Visit: Payer: Self-pay | Admitting: Emergency Medicine

## 2020-06-14 ENCOUNTER — Ambulatory Visit: Payer: Self-pay | Admitting: Emergency Medicine

## 2020-06-29 ENCOUNTER — Encounter (HOSPITAL_COMMUNITY): Payer: Self-pay

## 2020-06-29 ENCOUNTER — Other Ambulatory Visit: Payer: Self-pay

## 2020-06-29 DIAGNOSIS — M542 Cervicalgia: Secondary | ICD-10-CM | POA: Insufficient documentation

## 2020-06-29 DIAGNOSIS — Z5321 Procedure and treatment not carried out due to patient leaving prior to being seen by health care provider: Secondary | ICD-10-CM | POA: Insufficient documentation

## 2020-06-29 NOTE — ED Triage Notes (Signed)
Patient arrived stating that over the last few weeks he has had pain that moves from the right shoulder up to the back of his head. Declines any known injury. States he has been taking Aleve for pain.

## 2020-06-30 ENCOUNTER — Emergency Department (HOSPITAL_COMMUNITY)
Admission: EM | Admit: 2020-06-30 | Discharge: 2020-06-30 | Disposition: A | Discharge status: Home / Self Care | Payer: Self-pay | Attending: Emergency Medicine | Admitting: Emergency Medicine

## 2020-07-14 ENCOUNTER — Encounter: Payer: Self-pay | Admitting: Emergency Medicine

## 2020-07-14 ENCOUNTER — Ambulatory Visit (INDEPENDENT_AMBULATORY_CARE_PROVIDER_SITE_OTHER): Payer: Self-pay | Admitting: Emergency Medicine

## 2020-07-14 ENCOUNTER — Other Ambulatory Visit: Payer: Self-pay

## 2020-07-14 VITALS — BP 172/94 | HR 92 | Temp 97.9°F | Resp 16 | Ht 68.5 in | Wt 197.0 lb

## 2020-07-14 DIAGNOSIS — I10 Essential (primary) hypertension: Secondary | ICD-10-CM

## 2020-07-14 MED ORDER — LISINOPRIL-HYDROCHLOROTHIAZIDE 20-12.5 MG PO TABS: 2.0000 | ORAL_TABLET | Freq: Every day | ORAL | 3 refills | Status: DC

## 2020-07-14 MED ORDER — AMLODIPINE BESYLATE 10 MG PO TABS: 10.0000 mg | ORAL_TABLET | Freq: Every day | ORAL | 3 refills | Status: DC

## 2020-07-14 NOTE — Progress Notes (Signed)
Colin Pena 65 y.o.   Chief Complaint  Patient presents with  . Medication Refill    Amlodipine and Lisinopril-Hctz  . Hypertension    Follow up 6 month    HISTORY OF PRESENT ILLNESS: This is a 65 y.o. male with history of hypertension here for follow-up and medication refill. Presently on amlodipine 10 mg daily and Zestoretic 20-12.5 mg daily. Started having sinus symptoms last Monday and has been taking Tylenol Sinus over-the-counter.  Symptoms gradually improving. Does not have medical insurance.  Does not want to have blood work done today. Health maintenance items discussed including colonoscopy.  Wants to wait until he gets medical insurance. Has no other complaints or medical concerns today.  HPI   Prior to Admission medications   Medication Sig Start Date End Date Taking? Authorizing Provider  amLODipine (NORVASC) 10 MG tablet Take 1 tablet (10 mg total) by mouth daily. 11/12/19  Yes SagardiaEilleen Kempf, MD  aspirin 81 MG chewable tablet Chew 81 mg by mouth daily.   Yes [provider]  lisinopril-hydrochlorothiazide (ZESTORETIC) 20-12.5 MG tablet Take 1 tablet by mouth daily. 11/12/19  Yes SagardiaEilleen Kempf, MD  NONFORMULARY OR COMPOUNDED ITEM Triamcinolone 0.1%/Eucerin. Unknown ratio.1 application topically daily as directed. 11/12/19  Yes Georgina Quint, MD    No Active Allergies  Patient Active Problem List   Diagnosis Date Noted  . Hypertension 05/09/2012    Past Medical History:  Diagnosis Date  . Hypertension     No past surgical history on file.  Social History   Socioeconomic History  . Marital status: Married    Spouse name: Not on file  . Number of children: Not on file  . Years of education: Not on file  . Highest education level: Not on file  Occupational History  . Not on file  Tobacco Use  . Smoking status: Former Games developer  . Smokeless tobacco: Never Used  Substance and Sexual Activity  . Alcohol use: No     Alcohol/week: 0.0 standard drinks  . Drug use: No  . Sexual activity: Not on file  Other Topics Concern  . Not on file  Social History Narrative  . Not on file   Social Determinants of Health   Financial Resource Strain: Not on file  Food Insecurity: Not on file  Transportation Needs: Not on file  Physical Activity: Not on file  Stress: Not on file  Social Connections: Not on file  Intimate Partner Violence: Not on file    Family History  Problem Relation Age of Onset  . Cancer Father   . Hypertension Sister   . Cancer Maternal Aunt   . Heart disease Paternal Uncle   . Cancer Maternal Uncle   . Cancer Maternal Uncle   . Cancer Paternal Uncle   . Cancer Paternal Uncle      Review of Systems  Constitutional: Negative.  Negative for chills and fever.  HENT: Negative.  Negative for congestion and sore throat.   Eyes: Negative.  Negative for blurred vision and double vision.  Respiratory: Negative.  Negative for cough and shortness of breath.   Cardiovascular: Negative.  Negative for chest pain and palpitations.  Gastrointestinal: Negative.  Negative for abdominal pain, diarrhea, nausea and vomiting.  Genitourinary: Negative.  Negative for hematuria.  Musculoskeletal: Negative.  Negative for myalgias.  Skin: Negative.  Negative for rash.  Neurological: Negative.  Negative for dizziness and headaches.  All other systems reviewed and are negative.   Today's Vitals  07/14/20 1022  BP: (!) 172/94  Pulse: 92  Resp: 16  Temp: 97.9 F (36.6 C)  TempSrc: Temporal  SpO2: 94%  Weight: 197 lb (89.4 kg)  Height: 5' 8.5" (1.74 m)   Body mass index is 29.52 kg/m. BP Readings from Last 3 Encounters:  07/14/20 (!) 172/94  06/29/20 (!) 169/107  03/31/20 130/90    Physical Exam Vitals reviewed.  Constitutional:      Appearance: Normal appearance.  HENT:     Head: Normocephalic.  Eyes:     Extraocular Movements: Extraocular movements intact.     Pupils: Pupils are  equal, round, and reactive to light.  Cardiovascular:     Rate and Rhythm: Normal rate and regular rhythm.     Pulses: Normal pulses.     Heart sounds: Normal heart sounds.  Pulmonary:     Effort: Pulmonary effort is normal.     Breath sounds: Normal breath sounds.  Musculoskeletal:        General: Normal range of motion.     Cervical back: Normal range of motion and neck supple.  Skin:    General: Skin is warm and dry.     Capillary Refill: Capillary refill takes less than 2 seconds.  Neurological:     General: No focal deficit present.     Mental Status: He is alert and oriented to person, place, and time.  Psychiatric:        Mood and Affect: Mood normal.        Behavior: Behavior normal.      ASSESSMENT & PLAN: Hypertension Uncontrolled hypertension.  Continue Amlodipine 10 mg daily.  Increase Zestoretic 20-12.5 mg to 2 tablets daily.  Diet and nutrition discussed. Need for blood work discussed with patient.  Prefers not to do it. Follow-up in 3 to 6 months.   Colin Pena was seen today for medication refill and hypertension.  Diagnoses and all orders for this visit:  Essential hypertension -     amLODipine (NORVASC) 10 MG tablet; Take 1 tablet (10 mg total) by mouth daily. -     lisinopril-hydrochlorothiazide (ZESTORETIC) 20-12.5 MG tablet; Take 2 tablets by mouth daily.  Primary hypertension  Uncontrolled hypertension    Patient Instructions   [email or web address]">  DASH Eating Plan DASH stands for Dietary Approaches to Stop Hypertension. The DASH eating plan is a healthy eating plan that has been shown to:  Reduce high blood pressure (hypertension).  Reduce your risk for type 2 diabetes, heart disease, and stroke.  Help with weight loss. What are tips for following this plan? Reading food labels  Check food labels for the amount of salt (sodium) per serving. Choose foods with less than 5 percent of the Daily  Value of sodium. Generally, foods with less than 300 milligrams (mg) of sodium per serving fit into this eating plan.  To find whole grains, look for the word "whole" as the first word in the ingredient list. Shopping  Buy products labeled as "low-sodium" or "no salt added."  Buy fresh foods. Avoid canned foods and pre-made or frozen meals. Cooking  Avoid adding salt when cooking. Use salt-free seasonings or herbs instead of table salt or sea salt. Check with your health care provider or pharmacist before using salt substitutes.  Do not fry foods. Cook foods using healthy methods such as baking, boiling, grilling, roasting, and broiling instead.  Cook with heart-healthy oils, such as olive, canola, avocado, soybean, or sunflower oil. Meal planning  Eat a balanced diet that includes: ?  4 or more servings of fruits and 4 or more servings of vegetables each day. Try to fill one-half of your plate with fruits and vegetables. ? 6-8 servings of whole grains each day. ? Less than 6 oz (170 g) of lean meat, poultry, or fish each day. A 3-oz (85-g) serving of meat is about the same size as a deck of cards. One egg equals 1 oz (28 g). ? 2-3 servings of low-fat dairy each day. One serving is 1 cup (237 mL). ? 1 serving of nuts, seeds, or beans 5 times each week. ? 2-3 servings of heart-healthy fats. Healthy fats called omega-3 fatty acids are found in foods such as walnuts, flaxseeds, fortified milks, and eggs. These fats are also found in cold-water fish, such as sardines, salmon, and mackerel.  Limit how much you eat of: ? Canned or prepackaged foods. ? Food that is high in trans fat, such as some fried foods. ? Food that is high in saturated fat, such as fatty meat. ? Desserts and other sweets, sugary drinks, and other foods with added sugar. ? Full-fat dairy products.  Do not salt foods before eating.  Do not eat more than 4 egg yolks a week.  Try to eat at least 2 vegetarian meals a  week.  Eat more home-cooked food and less restaurant, buffet, and fast food.   Lifestyle  When eating at a restaurant, ask that your food be prepared with less salt or no salt, if possible.  If you drink alcohol: ? Limit how much you use to:  0-1 drink a day for women who are not pregnant.  0-2 drinks a day for men. ? Be aware of how much alcohol is in your drink. In the [place] one drink equals one 12 oz bottle of beer (355 mL), one 5 oz glass of wine (148 mL), or one 1 oz glass of hard liquor (44 mL). General information  Avoid eating more than 2,300 mg of salt a day. If you have hypertension, you may need to reduce your sodium intake to 1,500 mg a day.  Work with your health care provider to maintain a healthy body weight or to lose weight. Ask what an ideal weight is for you.  Get at least 30 minutes of exercise that causes your heart to beat faster (aerobic exercise) most days of the week. Activities may include walking, swimming, or biking.  Work with your health care provider or dietitian to adjust your eating plan to your individual calorie needs. What foods should I eat? Fruits All fresh, dried, or frozen fruit. Canned fruit in natural juice (without added sugar). Vegetables Fresh or frozen vegetables (raw, steamed, roasted, or grilled). Low-sodium or reduced-sodium tomato and vegetable juice. Low-sodium or reduced-sodium tomato sauce and tomato paste. Low-sodium or reduced-sodium canned vegetables. Grains Whole-grain or whole-wheat bread. Whole-grain or whole-wheat pasta. Brown rice. Orpah Cobb. Bulgur. Whole-grain and low-sodium cereals. Pita bread. Low-fat, low-sodium crackers. Whole-wheat flour tortillas. Meats and other proteins Skinless chicken or [place]. Ground chicken or [place]. Pork with fat trimmed off. Fish and seafood. Egg whites. Dried beans, peas, or lentils. Unsalted nuts, nut butters, and seeds. Unsalted canned beans. Lean cuts of beef with fat trimmed off.  Low-sodium, lean precooked or cured meat, such as sausages or meat loaves. Dairy Low-fat (1%) or fat-free (skim) milk. Reduced-fat, low-fat, or fat-free cheeses. Nonfat, low-sodium ricotta or cottage cheese. Low-fat or nonfat yogurt. Low-fat, low-sodium cheese. Fats and oils Soft margarine without trans fats. Vegetable oil.  Reduced-fat, low-fat, or light mayonnaise and salad dressings (reduced-sodium). Canola, safflower, olive, avocado, soybean, and sunflower oils. Avocado. Seasonings and condiments Herbs. Spices. Seasoning mixes without salt. Other foods Unsalted popcorn and pretzels. Fat-free sweets. The items listed above may not be a complete list of foods and beverages you can eat. Contact a dietitian for more information. What foods should I avoid? Fruits Canned fruit in a light or heavy syrup. Fried fruit. Fruit in cream or butter sauce. Vegetables Creamed or fried vegetables. Vegetables in a cheese sauce. Regular canned vegetables (not low-sodium or reduced-sodium). Regular canned tomato sauce and paste (not low-sodium or reduced-sodium). Regular tomato and vegetable juice (not low-sodium or reduced-sodium). Rosita Fire. Olives. Grains Baked goods made with fat, such as croissants, muffins, or some breads. Dry pasta or rice meal packs. Meats and other proteins Fatty cuts of meat. Ribs. Fried meat. Tomasa Blase. Bologna, salami, and other precooked or cured meats, such as sausages or meat loaves. Fat from the back of a pig (fatback). Bratwurst. Salted nuts and seeds. Canned beans with added salt. Canned or smoked fish. Whole eggs or egg yolks. Chicken or [place] with skin. Dairy Whole or 2% milk, cream, and half-and-half. Whole or full-fat cream cheese. Whole-fat or sweetened yogurt. Full-fat cheese. Nondairy creamers. Whipped toppings. Processed cheese and cheese spreads. Fats and oils Butter. Stick margarine. Lard. Shortening. Ghee. Bacon fat. Tropical oils, such as coconut, palm kernel, or palm  oil. Seasonings and condiments Onion salt, garlic salt, seasoned salt, table salt, and sea salt. Worcestershire sauce. Tartar sauce. Barbecue sauce. Teriyaki sauce. Soy sauce, including reduced-sodium. Steak sauce. Canned and packaged gravies. Fish sauce. Oyster sauce. Cocktail sauce. Store-bought horseradish. Ketchup. Mustard. Meat flavorings and tenderizers. Bouillon cubes. Hot sauces. Pre-made or packaged marinades. Pre-made or packaged taco seasonings. Relishes. Regular salad dressings. Other foods Salted popcorn and pretzels. The items listed above may not be a complete list of foods and beverages you should avoid. Contact a dietitian for more information. Where to find more information  National Heart, Lung, and Blood Institute: PopSteam.is  American Heart Association: www.heart.org  Academy of Nutrition and Dietetics: www.eatright.org  National Kidney Foundation: www.kidney.org Summary  The DASH eating plan is a healthy eating plan that has been shown to reduce high blood pressure (hypertension). It may also reduce your risk for type 2 diabetes, heart disease, and stroke.  When on the DASH eating plan, aim to eat more fresh fruits and vegetables, whole grains, lean proteins, low-fat dairy, and heart-healthy fats.  With the DASH eating plan, you should limit salt (sodium) intake to 2,300 mg a day. If you have hypertension, you may need to reduce your sodium intake to 1,500 mg a day.  Work with your health care provider or dietitian to adjust your eating plan to your individual calorie needs. This information is not intended to replace advice given to you by your health care provider. Make sure you discuss any questions you have with your health care provider. Document Revised: 05/09/2019 Document Reviewed: 05/09/2019 Elsevier Patient Education  2021 Elsevier Inc. Hypertension, Adult High blood pressure (hypertension) is when the force of blood pumping through the arteries is  too strong. The arteries are the blood vessels that carry blood from the heart throughout the body. Hypertension forces the heart to work harder to pump blood and may cause arteries to become narrow or stiff. Untreated or uncontrolled hypertension can cause a heart attack, heart failure, a stroke, kidney disease, and other problems. A blood pressure reading consists of a  higher number over a lower number. Ideally, your blood pressure should be below 120/80. The first ("top") number is called the systolic pressure. It is a measure of the pressure in your arteries as your heart beats. The second ("bottom") number is called the diastolic pressure. It is a measure of the pressure in your arteries as the heart relaxes. What are the causes? The exact cause of this condition is not known. There are some conditions that result in or are related to high blood pressure. What increases the risk? Some risk factors for high blood pressure are under your control. The following factors may make you more likely to develop this condition:  Smoking.  Having type 2 diabetes mellitus, high cholesterol, or both.  Not getting enough exercise or physical activity.  Being overweight.  Having too much fat, sugar, calories, or salt (sodium) in your diet.  Drinking too much alcohol. Some risk factors for high blood pressure may be difficult or impossible to change. Some of these factors include:  Having chronic kidney disease.  Having a family history of high blood pressure.  Age. Risk increases with age.  Race. You may be at higher risk if you are African American.  Gender. Men are at higher risk than women before age 64. After age 64, women are at higher risk than men.  Having obstructive sleep apnea.  Stress. What are the signs or symptoms? High blood pressure may not cause symptoms. Very high blood pressure (hypertensive crisis) may cause:  Headache.  Anxiety.  Shortness of  breath.  Nosebleed.  Nausea and vomiting.  Vision changes.  Severe chest pain.  Seizures. How is this diagnosed? This condition is diagnosed by measuring your blood pressure while you are seated, with your arm resting on a flat surface, your legs uncrossed, and your feet flat on the floor. The cuff of the blood pressure monitor will be placed directly against the skin of your upper arm at the level of your heart. It should be measured at least twice using the same arm. Certain conditions can cause a difference in blood pressure between your right and left arms. Certain factors can cause blood pressure readings to be lower or higher than normal for a short period of time:  When your blood pressure is higher when you are in a health care provider's office than when you are at home, this is called white coat hypertension. Most people with this condition do not need medicines.  When your blood pressure is higher at home than when you are in a health care provider's office, this is called masked hypertension. Most people with this condition may need medicines to control blood pressure. If you have a high blood pressure reading during one visit or you have normal blood pressure with other risk factors, you may be asked to:  Return on a different day to have your blood pressure checked again.  Monitor your blood pressure at home for 1 week or longer. If you are diagnosed with hypertension, you may have other blood or imaging tests to help your health care provider understand your overall risk for other conditions. How is this treated? This condition is treated by making healthy lifestyle changes, such as eating healthy foods, exercising more, and reducing your alcohol intake. Your health care provider may prescribe medicine if lifestyle changes are not enough to get your blood pressure under control, and if:  Your systolic blood pressure is above 130.  Your diastolic blood pressure is above  80. Your personal target blood pressure may vary depending on your medical conditions, your age, and other factors. Follow these instructions at home: Eating and drinking  Eat a diet that is high in fiber and potassium, and low in sodium, added sugar, and fat. An example eating plan is called the DASH (Dietary Approaches to Stop Hypertension) diet. To eat this way: ? Eat plenty of fresh fruits and vegetables. Try to fill one half of your plate at each meal with fruits and vegetables. ? Eat whole grains, such as whole-wheat pasta, brown rice, or whole-grain bread. Fill about one fourth of your plate with whole grains. ? Eat or drink low-fat dairy products, such as skim milk or low-fat yogurt. ? Avoid fatty cuts of meat, processed or cured meats, and poultry with skin. Fill about one fourth of your plate with lean proteins, such as fish, chicken without skin, beans, eggs, or tofu. ? Avoid pre-made and processed foods. These tend to be higher in sodium, added sugar, and fat.  Reduce your daily sodium intake. Most people with hypertension should eat less than 1,500 mg of sodium a day.  Do not drink alcohol if: ? Your health care provider tells you not to drink. ? You are pregnant, may be pregnant, or are planning to become pregnant.  If you drink alcohol: ? Limit how much you use to:  0-1 drink a day for women.  0-2 drinks a day for men. ? Be aware of how much alcohol is in your drink. In the [place] one drink equals one 12 oz bottle of beer (355 mL), one 5 oz glass of wine (148 mL), or one 1 oz glass of hard liquor (44 mL).   Lifestyle  Work with your health care provider to maintain a healthy body weight or to lose weight. Ask what an ideal weight is for you.  Get at least 30 minutes of exercise most days of the week. Activities may include walking, swimming, or biking.  Include exercise to strengthen your muscles (resistance exercise), such as Pilates or lifting weights, as part of your  weekly exercise routine. Try to do these types of exercises for 30 minutes at least 3 days a week.  Do not use any products that contain nicotine or tobacco, such as cigarettes, e-cigarettes, and chewing tobacco. If you need help quitting, ask your health care provider.  Monitor your blood pressure at home as told by your health care provider.  Keep all follow-up visits as told by your health care provider. This is important.   Medicines  Take over-the-counter and prescription medicines only as told by your health care provider. Follow directions carefully. Blood pressure medicines must be taken as prescribed.  Do not skip doses of blood pressure medicine. Doing this puts you at risk for problems and can make the medicine less effective.  Ask your health care provider about side effects or reactions to medicines that you should watch for. Contact a health care provider if you:  Think you are having a reaction to a medicine you are taking.  Have headaches that keep coming back (recurring).  Feel dizzy.  Have swelling in your ankles.  Have trouble with your vision. Get help right away if you:  Develop a severe headache or confusion.  Have unusual weakness or numbness.  Feel faint.  Have severe pain in your chest or abdomen.  Vomit repeatedly.  Have trouble breathing. Summary  Hypertension is when the force of blood pumping through your arteries  is too strong. If this condition is not controlled, it may put you at risk for serious complications.  Your personal target blood pressure may vary depending on your medical conditions, your age, and other factors. For most people, a normal blood pressure is less than 120/80.  Hypertension is treated with lifestyle changes, medicines, or a combination of both. Lifestyle changes include losing weight, eating a healthy, low-sodium diet, exercising more, and limiting alcohol. This information is not intended to replace advice given to  you by your health care provider. Make sure you discuss any questions you have with your health care provider. Document Revised: 02/13/2018 Document Reviewed: 02/13/2018 Elsevier Patient Education  2021 Elsevier Inc.      Edwina Barth, MD [facility]

## 2020-07-14 NOTE — Patient Instructions (Signed)
https://www.nhlbi.nih.gov/files/docs/public/heart/dash_brief.pdf">  DASH Eating Plan DASH stands for Dietary Approaches to Stop Hypertension. The DASH eating plan is a healthy eating plan that has been shown to:  Reduce high blood pressure (hypertension).  Reduce your risk for type 2 diabetes, heart disease, and stroke.  Help with weight loss. What are tips for following this plan? Reading food labels  Check food labels for the amount of salt (sodium) per serving. Choose foods with less than 5 percent of the Daily Value of sodium. Generally, foods with less than 300 milligrams (mg) of sodium per serving fit into this eating plan.  To find whole grains, look for the word "whole" as the first word in the ingredient list. Shopping  Buy products labeled as "low-sodium" or "no salt added."  Buy fresh foods. Avoid canned foods and pre-made or frozen meals. Cooking  Avoid adding salt when cooking. Use salt-free seasonings or herbs instead of table salt or sea salt. Check with your health care provider or pharmacist before using salt substitutes.  Do not fry foods. Cook foods using healthy methods such as baking, boiling, grilling, roasting, and broiling instead.  Cook with heart-healthy oils, such as olive, canola, avocado, soybean, or sunflower oil. Meal planning  Eat a balanced diet that includes: ? 4 or more servings of fruits and 4 or more servings of vegetables each day. Try to fill one-half of your plate with fruits and vegetables. ? 6-8 servings of whole grains each day. ? Less than 6 oz (170 g) of lean meat, poultry, or fish each day. A 3-oz (85-g) serving of meat is about the same size as a deck of cards. One egg equals 1 oz (28 g). ? 2-3 servings of low-fat dairy each day. One serving is 1 cup (237 mL). ? 1 serving of nuts, seeds, or beans 5 times each week. ? 2-3 servings of heart-healthy fats. Healthy fats called omega-3 fatty acids are found in foods such as walnuts,  flaxseeds, fortified milks, and eggs. These fats are also found in cold-water fish, such as sardines, salmon, and mackerel.  Limit how much you eat of: ? Canned or prepackaged foods. ? Food that is high in trans fat, such as some fried foods. ? Food that is high in saturated fat, such as fatty meat. ? Desserts and other sweets, sugary drinks, and other foods with added sugar. ? Full-fat dairy products.  Do not salt foods before eating.  Do not eat more than 4 egg yolks a week.  Try to eat at least 2 vegetarian meals a week.  Eat more home-cooked food and less restaurant, buffet, and fast food.   Lifestyle  When eating at a restaurant, ask that your food be prepared with less salt or no salt, if possible.  If you drink alcohol: ? Limit how much you use to:  0-1 drink a day for women who are not pregnant.  0-2 drinks a day for men. ? Be aware of how much alcohol is in your drink. In the [place] one drink equals one 12 oz bottle of beer (355 mL), one 5 oz glass of wine (148 mL), or one 1 oz glass of hard liquor (44 mL). General information  Avoid eating more than 2,300 mg of salt a day. If you have hypertension, you may need to reduce your sodium intake to 1,500 mg a day.  Work with your health care provider to maintain a healthy body weight or to lose weight. Ask what an ideal weight is for   you.  Get at least 30 minutes of exercise that causes your heart to beat faster (aerobic exercise) most days of the week. Activities may include walking, swimming, or biking.  Work with your health care provider or dietitian to adjust your eating plan to your individual calorie needs. What foods should I eat? Fruits All fresh, dried, or frozen fruit. Canned fruit in natural juice (without added sugar). Vegetables Fresh or frozen vegetables (raw, steamed, roasted, or grilled). Low-sodium or reduced-sodium tomato and vegetable juice. Low-sodium or reduced-sodium tomato sauce and tomato paste.  Low-sodium or reduced-sodium canned vegetables. Grains Whole-grain or whole-wheat bread. Whole-grain or whole-wheat pasta. Brown rice. Oatmeal. Quinoa. Bulgur. Whole-grain and low-sodium cereals. Pita bread. Low-fat, low-sodium crackers. Whole-wheat flour tortillas. Meats and other proteins Skinless chicken or turkey. Ground chicken or turkey. Pork with fat trimmed off. Fish and seafood. Egg whites. Dried beans, peas, or lentils. Unsalted nuts, nut butters, and seeds. Unsalted canned beans. Lean cuts of beef with fat trimmed off. Low-sodium, lean precooked or cured meat, such as sausages or meat loaves. Dairy Low-fat (1%) or fat-free (skim) milk. Reduced-fat, low-fat, or fat-free cheeses. Nonfat, low-sodium ricotta or cottage cheese. Low-fat or nonfat yogurt. Low-fat, low-sodium cheese. Fats and oils Soft margarine without trans fats. Vegetable oil. Reduced-fat, low-fat, or light mayonnaise and salad dressings (reduced-sodium). Canola, safflower, olive, avocado, soybean, and sunflower oils. Avocado. Seasonings and condiments Herbs. Spices. Seasoning mixes without salt. Other foods Unsalted popcorn and pretzels. Fat-free sweets. The items listed above may not be a complete list of foods and beverages you can eat. Contact a dietitian for more information. What foods should I avoid? Fruits Canned fruit in a light or heavy syrup. Fried fruit. Fruit in cream or butter sauce. Vegetables Creamed or fried vegetables. Vegetables in a cheese sauce. Regular canned vegetables (not low-sodium or reduced-sodium). Regular canned tomato sauce and paste (not low-sodium or reduced-sodium). Regular tomato and vegetable juice (not low-sodium or reduced-sodium). Pickles. Olives. Grains Baked goods made with fat, such as croissants, muffins, or some breads. Dry pasta or rice meal packs. Meats and other proteins Fatty cuts of meat. Ribs. Fried meat. Bacon. Bologna, salami, and other precooked or cured meats, such as  sausages or meat loaves. Fat from the back of a pig (fatback). Bratwurst. Salted nuts and seeds. Canned beans with added salt. Canned or smoked fish. Whole eggs or egg yolks. Chicken or turkey with skin. Dairy Whole or 2% milk, cream, and half-and-half. Whole or full-fat cream cheese. Whole-fat or sweetened yogurt. Full-fat cheese. Nondairy creamers. Whipped toppings. Processed cheese and cheese spreads. Fats and oils Butter. Stick margarine. Lard. Shortening. Ghee. Bacon fat. Tropical oils, such as coconut, palm kernel, or palm oil. Seasonings and condiments Onion salt, garlic salt, seasoned salt, table salt, and sea salt. Worcestershire sauce. Tartar sauce. Barbecue sauce. Teriyaki sauce. Soy sauce, including reduced-sodium. Steak sauce. Canned and packaged gravies. Fish sauce. Oyster sauce. Cocktail sauce. Store-bought horseradish. Ketchup. Mustard. Meat flavorings and tenderizers. Bouillon cubes. Hot sauces. Pre-made or packaged marinades. Pre-made or packaged taco seasonings. Relishes. Regular salad dressings. Other foods Salted popcorn and pretzels. The items listed above may not be a complete list of foods and beverages you should avoid. Contact a dietitian for more information. Where to find more information  National Heart, Lung, and Blood Institute: www.nhlbi.nih.gov  American Heart Association: www.heart.org  Academy of Nutrition and Dietetics: www.eatright.org  National Kidney Foundation: www.kidney.org Summary  The DASH eating plan is a healthy eating plan that has been shown to   reduce high blood pressure (hypertension). It may also reduce your risk for type 2 diabetes, heart disease, and stroke.  When on the DASH eating plan, aim to eat more fresh fruits and vegetables, whole grains, lean proteins, low-fat dairy, and heart-healthy fats.  With the DASH eating plan, you should limit salt (sodium) intake to 2,300 mg a day. If you have hypertension, you may need to reduce your  sodium intake to 1,500 mg a day.  Work with your health care provider or dietitian to adjust your eating plan to your individual calorie needs. This information is not intended to replace advice given to you by your health care provider. Make sure you discuss any questions you have with your health care provider. Document Revised: 05/09/2019 Document Reviewed: 05/09/2019 Elsevier Patient Education  2021 [employer]. Hypertension, Adult High blood pressure (hypertension) is when the force of blood pumping through the arteries is too strong. The arteries are the blood vessels that carry blood from the heart throughout the body. Hypertension forces the heart to work harder to pump blood and may cause arteries to become narrow or stiff. Untreated or uncontrolled hypertension can cause a heart attack, heart failure, a stroke, kidney disease, and other problems. A blood pressure reading consists of a higher number over a lower number. Ideally, your blood pressure should be below 120/80. The first ("top") number is called the systolic pressure. It is a measure of the pressure in your arteries as your heart beats. The second ("bottom") number is called the diastolic pressure. It is a measure of the pressure in your arteries as the heart relaxes. What are the causes? The exact cause of this condition is not known. There are some conditions that result in or are related to high blood pressure. What increases the risk? Some risk factors for high blood pressure are under your control. The following factors may make you more likely to develop this condition:  Smoking.  Having type 2 diabetes mellitus, high cholesterol, or both.  Not getting enough exercise or physical activity.  Being overweight.  Having too much fat, sugar, calories, or salt (sodium) in your diet.  Drinking too much alcohol. Some risk factors for high blood pressure may be difficult or impossible to change. Some of these factors  include:  Having chronic kidney disease.  Having a family history of high blood pressure.  Age. Risk increases with age.  Race. You may be at higher risk if you are African American.  Gender. Men are at higher risk than women before age 45. After age 65, women are at higher risk than men.  Having obstructive sleep apnea.  Stress. What are the signs or symptoms? High blood pressure may not cause symptoms. Very high blood pressure (hypertensive crisis) may cause:  Headache.  Anxiety.  Shortness of breath.  Nosebleed.  Nausea and vomiting.  Vision changes.  Severe chest pain.  Seizures. How is this diagnosed? This condition is diagnosed by measuring your blood pressure while you are seated, with your arm resting on a flat surface, your legs uncrossed, and your feet flat on the floor. The cuff of the blood pressure monitor will be placed directly against the skin of your upper arm at the level of your heart. It should be measured at least twice using the same arm. Certain conditions can cause a difference in blood pressure between your right and left arms. Certain factors can cause blood pressure readings to be lower or higher than normal for a short   period of time:  When your blood pressure is higher when you are in a health care provider's office than when you are at home, this is called white coat hypertension. Most people with this condition do not need medicines.  When your blood pressure is higher at home than when you are in a health care provider's office, this is called masked hypertension. Most people with this condition may need medicines to control blood pressure. If you have a high blood pressure reading during one visit or you have normal blood pressure with other risk factors, you may be asked to:  Return on a different day to have your blood pressure checked again.  Monitor your blood pressure at home for 1 week or longer. If you are diagnosed with  hypertension, you may have other blood or imaging tests to help your health care provider understand your overall risk for other conditions. How is this treated? This condition is treated by making healthy lifestyle changes, such as eating healthy foods, exercising more, and reducing your alcohol intake. Your health care provider may prescribe medicine if lifestyle changes are not enough to get your blood pressure under control, and if:  Your systolic blood pressure is above 130.  Your diastolic blood pressure is above 80. Your personal target blood pressure may vary depending on your medical conditions, your age, and other factors. Follow these instructions at home: Eating and drinking  Eat a diet that is high in fiber and potassium, and low in sodium, added sugar, and fat. An example eating plan is called the DASH (Dietary Approaches to Stop Hypertension) diet. To eat this way: ? Eat plenty of fresh fruits and vegetables. Try to fill one half of your plate at each meal with fruits and vegetables. ? Eat whole grains, such as whole-wheat pasta, brown rice, or whole-grain bread. Fill about one fourth of your plate with whole grains. ? Eat or drink low-fat dairy products, such as skim milk or low-fat yogurt. ? Avoid fatty cuts of meat, processed or cured meats, and poultry with skin. Fill about one fourth of your plate with lean proteins, such as fish, chicken without skin, beans, eggs, or tofu. ? Avoid pre-made and processed foods. These tend to be higher in sodium, added sugar, and fat.  Reduce your daily sodium intake. Most people with hypertension should eat less than 1,500 mg of sodium a day.  Do not drink alcohol if: ? Your health care provider tells you not to drink. ? You are pregnant, may be pregnant, or are planning to become pregnant.  If you drink alcohol: ? Limit how much you use to:  0-1 drink a day for women.  0-2 drinks a day for men. ? Be aware of how much alcohol is in  your drink. In the [place] one drink equals one 12 oz bottle of beer (355 mL), one 5 oz glass of wine (148 mL), or one 1 oz glass of hard liquor (44 mL).   Lifestyle  Work with your health care provider to maintain a healthy body weight or to lose weight. Ask what an ideal weight is for you.  Get at least 30 minutes of exercise most days of the week. Activities may include walking, swimming, or biking.  Include exercise to strengthen your muscles (resistance exercise), such as Pilates or lifting weights, as part of your weekly exercise routine. Try to do these types of exercises for 30 minutes at least 3 days a week.  Do not use  any products that contain nicotine or tobacco, such as cigarettes, e-cigarettes, and chewing tobacco. If you need help quitting, ask your health care provider.  Monitor your blood pressure at home as told by your health care provider.  Keep all follow-up visits as told by your health care provider. This is important.   Medicines  Take over-the-counter and prescription medicines only as told by your health care provider. Follow directions carefully. Blood pressure medicines must be taken as prescribed.  Do not skip doses of blood pressure medicine. Doing this puts you at risk for problems and can make the medicine less effective.  Ask your health care provider about side effects or reactions to medicines that you should watch for. Contact a health care provider if you:  Think you are having a reaction to a medicine you are taking.  Have headaches that keep coming back (recurring).  Feel dizzy.  Have swelling in your ankles.  Have trouble with your vision. Get help right away if you:  Develop a severe headache or confusion.  Have unusual weakness or numbness.  Feel faint.  Have severe pain in your chest or abdomen.  Vomit repeatedly.  Have trouble breathing. Summary  Hypertension is when the force of blood pumping through your arteries is too  strong. If this condition is not controlled, it may put you at risk for serious complications.  Your personal target blood pressure may vary depending on your medical conditions, your age, and other factors. For most people, a normal blood pressure is less than 120/80.  Hypertension is treated with lifestyle changes, medicines, or a combination of both. Lifestyle changes include losing weight, eating a healthy, low-sodium diet, exercising more, and limiting alcohol. This information is not intended to replace advice given to you by your health care provider. Make sure you discuss any questions you have with your health care provider. Document Revised: 02/13/2018 Document Reviewed: 02/13/2018 Elsevier Patient Education  2021 [employer] Inc.  

## 2020-07-14 NOTE — Assessment & Plan Note (Addendum)
Uncontrolled hypertension.  Continue Amlodipine 10 mg daily.  Increase Zestoretic 20-12.5 mg to 2 tablets daily.  Diet and nutrition discussed. Need for blood work discussed with patient.  Prefers not to do it. Follow-up in 3 to 6 months.

## 2021-01-06 ENCOUNTER — Ambulatory Visit: Payer: Self-pay | Admitting: Emergency Medicine

## 2021-01-20 ENCOUNTER — Ambulatory Visit: Payer: Self-pay | Admitting: Emergency Medicine

## 2021-02-16 ENCOUNTER — Encounter: Payer: Self-pay | Admitting: Emergency Medicine

## 2021-02-16 ENCOUNTER — Ambulatory Visit (INDEPENDENT_AMBULATORY_CARE_PROVIDER_SITE_OTHER): Payer: Self-pay | Admitting: Emergency Medicine

## 2021-02-16 ENCOUNTER — Other Ambulatory Visit: Payer: Self-pay

## 2021-02-16 VITALS — BP 126/70 | HR 72 | Temp 97.9°F | Ht 68.0 in | Wt 191.0 lb

## 2021-02-16 DIAGNOSIS — I10 Essential (primary) hypertension: Secondary | ICD-10-CM

## 2021-02-16 MED ORDER — LISINOPRIL-HYDROCHLOROTHIAZIDE 20-12.5 MG PO TABS: 2.0000 | ORAL_TABLET | Freq: Every day | ORAL | 3 refills | Status: DC

## 2021-02-16 MED ORDER — AMLODIPINE BESYLATE 10 MG PO TABS: 10.0000 mg | ORAL_TABLET | Freq: Every day | ORAL | 3 refills | Status: DC

## 2021-02-16 NOTE — Progress Notes (Signed)
Colin Pena 65 y.o.   Chief Complaint  Patient presents with   Hypertension    Follow up    HISTORY OF PRESENT ILLNESS: This is a 65 y.o. male with history of hypertension here for follow-up. Presently taking amlodipine 10 mg daily and Zestoretic 20-12.5 mg daily.  Normal blood pressure readings at home. Presently has no medical insurance.  Prefers to wait on blood work and colonoscopy referrals.  Hypertension Pertinent negatives include no chest pain, headaches, palpitations or shortness of breath.    Prior to Admission medications   Medication Sig Start Date End Date Taking? Authorizing Provider  amLODipine (NORVASC) 10 MG tablet Take 1 tablet (10 mg total) by mouth daily. 07/14/20  Yes SagardiaEilleen Kempf, MD  aspirin 81 MG chewable tablet Chew 81 mg by mouth daily.   Yes [provider]  NONFORMULARY OR COMPOUNDED ITEM Triamcinolone 0.1%/Eucerin. Unknown ratio.1 application topically daily as directed. 11/12/19  Yes Abhijot Straughter, Eilleen Kempf, MD  lisinopril-hydrochlorothiazide (ZESTORETIC) 20-12.5 MG tablet Take 2 tablets by mouth daily. 07/14/20 10/12/20  Georgina Quint, MD    No Active Allergies  Patient Active Problem List   Diagnosis Date Noted   Hypertension 05/09/2012    Past Medical History:  Diagnosis Date   Hypertension     No past surgical history on file.  Social History   Socioeconomic History   Marital status: Married    Spouse name: Not on file   Number of children: Not on file   Years of education: Not on file   Highest education level: Not on file  Occupational History   Not on file  Tobacco Use   Smoking status: Former   Smokeless tobacco: Never  Substance and Sexual Activity   Alcohol use: No    Alcohol/week: 0.0 standard drinks   Drug use: No   Sexual activity: Not on file  Other Topics Concern   Not on file  Social History Narrative   Not on file   Social Determinants of Health   Financial Resource Strain: Not  on file  Food Insecurity: Not on file  Transportation Needs: Not on file  Physical Activity: Not on file  Stress: Not on file  Social Connections: Not on file  Intimate Partner Violence: Not on file    Family History  Problem Relation Age of Onset   Cancer Father    Hypertension Sister    Cancer Maternal Aunt    Heart disease Paternal Uncle    Cancer Maternal Uncle    Cancer Maternal Uncle    Cancer Paternal Uncle    Cancer Paternal Uncle      Review of Systems  Constitutional: Negative.  Negative for chills and fever.  HENT: Negative.  Negative for congestion and sore throat.   Respiratory: Negative.  Negative for cough and shortness of breath.   Cardiovascular:  Negative for chest pain and palpitations.  Gastrointestinal:  Negative for abdominal pain, diarrhea, nausea and vomiting.  Genitourinary: Negative.  Negative for dysuria and hematuria.  Skin: Negative.  Negative for rash.  Neurological:  Negative for dizziness and headaches.  All other systems reviewed and are negative.  Today's Vitals   02/16/21 1059  BP: 126/70  Pulse: 72  Temp: 97.9 F (36.6 C)  TempSrc: Oral  SpO2: 97%  Weight: 191 lb (86.6 kg)  Height: 5\' 8"  (1.727 m)   Body mass index is 29.04 kg/m.  Physical Exam Vitals reviewed.  Constitutional:      Appearance: Normal appearance.  HENT:     Head: Normocephalic.  Eyes:     Extraocular Movements: Extraocular movements intact.     Conjunctiva/sclera: Conjunctivae normal.     Pupils: Pupils are equal, round, and reactive to light.  Cardiovascular:     Rate and Rhythm: Normal rate and regular rhythm.     Pulses: Normal pulses.     Heart sounds: Normal heart sounds.  Pulmonary:     Effort: Pulmonary effort is normal.     Breath sounds: Normal breath sounds.  Musculoskeletal:     Cervical back: Normal range of motion and neck supple.  Skin:    General: Skin is warm and dry.     Capillary Refill: Capillary refill takes less than 2  seconds.  Neurological:     General: No focal deficit present.     Mental Status: He is alert and oriented to person, place, and time.  Psychiatric:        Mood and Affect: Mood normal.        Behavior: Behavior normal.     ASSESSMENT & PLAN: Hypertension Well-controlled hypertension.  Continue amlodipine 10 mg daily and Zestoretic 20-12.5 mg daily.  Dietary approaches to stop hypertension discussed. Follow-up in 6 months. Purnell was seen today for hypertension.  Diagnoses and all orders for this visit:  Primary hypertension  Essential hypertension -     lisinopril-hydrochlorothiazide (ZESTORETIC) 20-12.5 MG tablet; Take 2 tablets by mouth daily. -     amLODipine (NORVASC) 10 MG tablet; Take 1 tablet (10 mg total) by mouth daily.  Patient Instructions  Hypertension, Adult High blood pressure (hypertension) is when the force of blood pumping through the arteries is too strong. The arteries are the blood vessels that carry blood from the heart throughout the body. Hypertension forces the heart to work harder to pump blood and may cause arteries to become narrow or stiff. Untreated or uncontrolled hypertension can cause a heart attack, heart failure, a stroke, kidney disease, and other problems. A blood pressure reading consists of a higher number over a lower number. Ideally, your blood pressure should be below 120/80. The first ("top") number is called the systolic pressure. It is a measure of the pressure in your arteries as your heart beats. The second ("bottom") number is called the diastolic pressure. It is a measure of the pressure in your arteries as the heart relaxes. What are the causes? The exact cause of this condition is not known. There are some conditions that result in or are related to high blood pressure. What increases the risk? Some risk factors for high blood pressure are under your control. The following factors may make you more likely to develop this  condition: Smoking. Having type 2 diabetes mellitus, high cholesterol, or both. Not getting enough exercise or physical activity. Being overweight. Having too much fat, sugar, calories, or salt (sodium) in your diet. Drinking too much alcohol. Some risk factors for high blood pressure may be difficult or impossible to change. Some of these factors include: Having chronic kidney disease. Having a family history of high blood pressure. Age. Risk increases with age. Race. You may be at higher risk if you are African American. Gender. Men are at higher risk than women before age 32. After age 7, women are at higher risk than men. Having obstructive sleep apnea. Stress. What are the signs or symptoms? High blood pressure may not cause symptoms. Very high blood pressure (hypertensive crisis) may cause: Headache. Anxiety. Shortness of breath. Nosebleed. Nausea  and vomiting. Vision changes. Severe chest pain. Seizures. How is this diagnosed? This condition is diagnosed by measuring your blood pressure while you are seated, with your arm resting on a flat surface, your legs uncrossed, and your feet flat on the floor. The cuff of the blood pressure monitor will be placed directly against the skin of your upper arm at the level of your heart. It should be measured at least twice using the same arm. Certain conditions can cause a difference in blood pressure between your right and left arms. Certain factors can cause blood pressure readings to be lower or higher than normal for a short period of time: When your blood pressure is higher when you are in a health care provider's office than when you are at home, this is called white coat hypertension. Most people with this condition do not need medicines. When your blood pressure is higher at home than when you are in a health care provider's office, this is called masked hypertension. Most people with this condition may need medicines to control  blood pressure. If you have a high blood pressure reading during one visit or you have normal blood pressure with other risk factors, you may be asked to: Return on a different day to have your blood pressure checked again. Monitor your blood pressure at home for 1 week or longer. If you are diagnosed with hypertension, you may have other blood or imaging tests to help your health care provider understand your overall risk for other conditions. How is this treated? This condition is treated by making healthy lifestyle changes, such as eating healthy foods, exercising more, and reducing your alcohol intake. Your health care provider may prescribe medicine if lifestyle changes are not enough to get your blood pressure under control, and if: Your systolic blood pressure is above 130. Your diastolic blood pressure is above 80. Your personal target blood pressure may vary depending on your medical conditions, your age, and other factors. Follow these instructions at home: Eating and drinking  Eat a diet that is high in fiber and potassium, and low in sodium, added sugar, and fat. An example eating plan is called the DASH (Dietary Approaches to Stop Hypertension) diet. To eat this way: Eat plenty of fresh fruits and vegetables. Try to fill one half of your plate at each meal with fruits and vegetables. Eat whole grains, such as whole-wheat pasta, brown rice, or whole-grain bread. Fill about one fourth of your plate with whole grains. Eat or drink low-fat dairy products, such as skim milk or low-fat yogurt. Avoid fatty cuts of meat, processed or cured meats, and poultry with skin. Fill about one fourth of your plate with lean proteins, such as fish, chicken without skin, beans, eggs, or tofu. Avoid pre-made and processed foods. These tend to be higher in sodium, added sugar, and fat. Reduce your daily sodium intake. Most people with hypertension should eat less than 1,500 mg of sodium a day. Do not  drink alcohol if: Your health care provider tells you not to drink. You are pregnant, may be pregnant, or are planning to become pregnant. If you drink alcohol: Limit how much you use to: 0-1 drink a day for women. 0-2 drinks a day for men. Be aware of how much alcohol is in your drink. In the [place] one drink equals one 12 oz bottle of beer (355 mL), one 5 oz glass of wine (148 mL), or one 1 oz glass of hard liquor (44 mL).  Lifestyle  Work with your health care provider to maintain a healthy body weight or to lose weight. Ask what an ideal weight is for you. Get at least 30 minutes of exercise most days of the week. Activities may include walking, swimming, or biking. Include exercise to strengthen your muscles (resistance exercise), such as Pilates or lifting weights, as part of your weekly exercise routine. Try to do these types of exercises for 30 minutes at least 3 days a week. Do not use any products that contain nicotine or tobacco, such as cigarettes, e-cigarettes, and chewing tobacco. If you need help quitting, ask your health care provider. Monitor your blood pressure at home as told by your health care provider. Keep all follow-up visits as told by your health care provider. This is important. Medicines Take over-the-counter and prescription medicines only as told by your health care provider. Follow directions carefully. Blood pressure medicines must be taken as prescribed. Do not skip doses of blood pressure medicine. Doing this puts you at risk for problems and can make the medicine less effective. Ask your health care provider about side effects or reactions to medicines that you should watch for. Contact a health care provider if you: Think you are having a reaction to a medicine you are taking. Have headaches that keep coming back (recurring). Feel dizzy. Have swelling in your ankles. Have trouble with your vision. Get help right away if you: Develop a severe headache or  confusion. Have unusual weakness or numbness. Feel faint. Have severe pain in your chest or abdomen. Vomit repeatedly. Have trouble breathing. Summary Hypertension is when the force of blood pumping through your arteries is too strong. If this condition is not controlled, it may put you at risk for serious complications. Your personal target blood pressure may vary depending on your medical conditions, your age, and other factors. For most people, a normal blood pressure is less than 120/80. Hypertension is treated with lifestyle changes, medicines, or a combination of both. Lifestyle changes include losing weight, eating a healthy, low-sodium diet, exercising more, and limiting alcohol. This information is not intended to replace advice given to you by your health care provider. Make sure you discuss any questions you have with your health care provider. Document Revised: 02/13/2018 Document Reviewed: 02/13/2018 Elsevier Patient Education  2022 Elsevier Inc.    Edwina Barth, MD [facility]

## 2021-02-16 NOTE — Patient Instructions (Signed)

## 2021-02-16 NOTE — Assessment & Plan Note (Signed)
Well-controlled hypertension.  Continue amlodipine 10 mg daily and Zestoretic 20-12.5 mg daily.  Dietary approaches to stop hypertension discussed. Follow-up in 6 months.

## 2021-06-14 ENCOUNTER — Emergency Department (HOSPITAL_COMMUNITY)
Admission: EM | Admit: 2021-06-14 | Discharge: 2021-06-14 | Disposition: A | Discharge status: Home / Self Care | Payer: Self-pay | Attending: Emergency Medicine | Admitting: Emergency Medicine

## 2021-06-14 ENCOUNTER — Other Ambulatory Visit: Payer: Self-pay

## 2021-06-14 ENCOUNTER — Encounter (HOSPITAL_COMMUNITY): Payer: Self-pay

## 2021-06-14 ENCOUNTER — Telehealth: Payer: Self-pay | Admitting: Emergency Medicine

## 2021-06-14 DIAGNOSIS — Z87891 Personal history of nicotine dependence: Secondary | ICD-10-CM | POA: Insufficient documentation

## 2021-06-14 DIAGNOSIS — Z7982 Long term (current) use of aspirin: Secondary | ICD-10-CM | POA: Insufficient documentation

## 2021-06-14 DIAGNOSIS — R21 Rash and other nonspecific skin eruption: Secondary | ICD-10-CM

## 2021-06-14 DIAGNOSIS — L309 Dermatitis, unspecified: Secondary | ICD-10-CM

## 2021-06-14 DIAGNOSIS — I10 Essential (primary) hypertension: Secondary | ICD-10-CM | POA: Insufficient documentation

## 2021-06-14 MED ORDER — TRIAMCINOLONE ACETONIDE 0.025 % EX OINT: 1.0000 "application " | TOPICAL_OINTMENT | Freq: Two times a day (BID) | CUTANEOUS | 0 refills | Status: DC

## 2021-06-14 MED ORDER — DEXAMETHASONE SODIUM PHOSPHATE 10 MG/ML IJ SOLN
10.0000 mg | Freq: Once | INTRAMUSCULAR | Status: AC
  Administered 2021-06-14: 23:00:00 10 mg via INTRAMUSCULAR
  Filled 2021-06-14: qty 1

## 2021-06-14 MED ORDER — NONFORMULARY OR COMPOUNDED ITEM: 5 refills | Status: AC

## 2021-06-14 MED ORDER — PREDNISONE 20 MG PO TABS: ORAL_TABLET | ORAL | 0 refills | Status: DC

## 2021-06-14 MED ORDER — HYDROXYZINE HCL 25 MG PO TABS: 25.0000 mg | ORAL_TABLET | Freq: Four times a day (QID) | ORAL | 0 refills | Status: DC | PRN

## 2021-06-14 NOTE — ED Triage Notes (Signed)
Pt reports with a rash on his back, left side, and both lower arms and hands since Saturday. Pt states that he has eczema, but this is the worse it has been.

## 2021-06-14 NOTE — Telephone Encounter (Signed)
1.Medication Requested: NONFORMULARY OR COMPOUNDED ITEM  2. Pharmacy (Name, Street, [place]): [phone] - [place], [place] - [address] AT [address] OF [address]  3. On Med List: yes   4. Last Visit with PCP: 02-16-2021  5. Next visit date with PCP: 08-17-2021   Agent: Please be advised that RX refills may take up to 3 business days. We ask that you follow-up with your pharmacy.

## 2021-06-14 NOTE — Discharge Instructions (Signed)
Please read and follow all provided instructions.  Your diagnoses today include:  1. Rash and nonspecific skin eruption    Tests performed today include: Vital signs. See below for your results today.   Medications prescribed:  Prednisone - steroid medicine   It is best to take this medication in the morning to prevent sleeping problems. If you are diabetic, monitor your blood sugar closely and stop taking Prednisone if blood sugar is over 300. Take with food to prevent stomach upset.   Hydroxyzine - antihistamine  You can find this medication over-the-counter.   This medication will make you drowsy. DO NOT drive or perform any activities that require you to be awake and alert if taking this.  Triamcinolone (Kenalog) cream - topical steroid medication for skin reaction  Take any prescribed medications only as directed.  Home care instructions:  Follow any educational materials contained in this packet.  BE VERY CAREFUL not to take multiple medicines containing Tylenol (also called acetaminophen). Doing so can lead to an overdose which can damage your liver and cause liver failure and possibly death.   Follow-up instructions: Please follow-up with your primary care provider as needed for further evaluation of your symptoms.   Return instructions:  Please return to the Emergency Department if you experience worsening symptoms.  Please return if you have any other emergent concerns.  Additional Information:  Your vital signs today were: BP (!) 151/87 (BP Location: Right Arm)    Pulse (!) 101    Temp 98.1 F (36.7 C) (Oral)    Resp 18    SpO2 98%  If your blood pressure (BP) was elevated above 135/85 this visit, please have this repeated by your doctor within one month. --------------

## 2021-06-14 NOTE — ED Provider Notes (Signed)
Emergency Medicine Provider Triage Evaluation Note  Colin Pena , a 65 y.o. male  was evaluated in triage.  Pt complains of rash to BL hands/arms and to back started 12/25.  States hx of eczema. States he's had similar rashes in the past and gotten better with steroids. States cough over the past few days.   Review of Systems  Positive: Rash, cough Negative: fever  Physical Exam  There were no vitals taken for this visit. Gen:   Awake, no distress   Resp:  Normal effort  MSK:   Moves extremities without difficulty  Other:  Lungs clear. Dry atopic appearing rash to BL dorsum of hands and to pt's back.   Medical Decision Making  Medically screening exam initiated at 8:48 PM.  Appropriate orders placed.  Colin Pena was informed that the remainder of the evaluation will be completed by another provider, this initial triage assessment does not replace that evaluation, and the importance of remaining in the ED until their evaluation is complete.  COVID test to assess cough.    Solon Augusta Shickley, [place] 06/14/21 2050    Gloris Manchester, MD 06/15/21 [phone]

## 2021-06-14 NOTE — ED Provider Notes (Signed)
Colin Pena   CSN: [id number] Arrival date & time: 06/14/21  2026     History Chief Complaint  Patient presents with   Rash    Colin Pena is a 65 y.o. male.  Patient with history of eczema presents to the emergency department for evaluation of itchy rash of his upper extremities, trunk, back and bilateral thighs.  Patient states that symptoms started on the back about 4 days ago.  It then spread.  It coincided with cold weather.  No fevers or URI symptoms.  Mild cough.  No lip, tongue swelling.  No new topical or food/medication exposures.  Patient has had similar rashes in the past however has not had an outbreak to this extent in several years.  He states that he has had improvement in the past with "a shot" and prednisone taper.  He does state that he has seen a dermatologist in the past and was given triamcinolone ointment for a similar rash.  He complains of difficulty sleeping due to itching.      Past Medical History:  Diagnosis Date   Hypertension     Patient Active Problem List   Diagnosis Date Noted   Hypertension 05/09/2012    History reviewed. No pertinent surgical history.     Family History  Problem Relation Age of Onset   Cancer Father    Hypertension Sister    Cancer Maternal Aunt    Heart disease Paternal Uncle    Cancer Maternal Uncle    Cancer Maternal Uncle    Cancer Paternal Uncle    Cancer Paternal Uncle     Social History   Tobacco Use   Smoking status: Former   Smokeless tobacco: Never  Substance Use Topics   Alcohol use: No    Alcohol/week: 0.0 standard drinks   Drug use: No    Home Medications Prior to Admission medications   Medication Sig Start Date End Date Taking? Authorizing Provider  hydrOXYzine (ATARAX) 25 MG tablet Take 1 tablet (25 mg total) by mouth every 6 (six) hours as needed for itching. 06/14/21  Yes Renne Crigler, PA-C  predniSONE (DELTASONE) 20 MG tablet  3 Tabs PO Days 1-3, then 2 tabs PO Days 4-6, then 1 tab PO Day 7-9, then Half Tab PO Day 10-12 06/14/21  Yes Renne Crigler, PA-C  triamcinolone (KENALOG) 0.025 % ointment Apply 1 application topically 2 (two) times daily. 06/14/21  Yes Renne Crigler, PA-C  amLODipine (NORVASC) 10 MG tablet Take 1 tablet (10 mg total) by mouth daily. 02/16/21   Georgina Quint, MD  aspirin 81 MG chewable tablet Chew 81 mg by mouth daily.    [provider]  lisinopril-hydrochlorothiazide (ZESTORETIC) 20-12.5 MG tablet Take 2 tablets by mouth daily. 02/16/21 05/17/21  Georgina Quint, MD  NONFORMULARY OR COMPOUNDED ITEM Triamcinolone 0.1%/Eucerin. Unknown ratio.1 application topically daily as directed. 06/14/21   Georgina Quint, MD    Allergies    Patient has no active allergies.  Review of Systems   Review of Systems  Constitutional:  Negative for fever.  HENT:  Negative for facial swelling and trouble swallowing.   Eyes:  Negative for redness.  Respiratory:  Positive for cough. Negative for shortness of breath, wheezing and stridor.   Cardiovascular:  Negative for chest pain.  Gastrointestinal:  Negative for nausea and vomiting.  Musculoskeletal:  Negative for myalgias.  Skin:  Positive for rash.  Neurological:  Negative for light-headedness.  Psychiatric/Behavioral:  Negative  for confusion.    Physical Exam Updated Vital Signs BP (!) 151/87 (BP Location: Right Arm)    Pulse (!) 101    Temp 98.1 F (36.7 C) (Oral)    Resp 18    SpO2 98%   Physical Exam Vitals and nursing Pena reviewed.  Constitutional:      Appearance: He is well-developed.  HENT:     Head: Normocephalic and atraumatic.     Mouth/Throat:     Comments: No lip or tongue swelling. Eyes:     Conjunctiva/sclera: Conjunctivae normal.  Pulmonary:     Effort: No respiratory distress.  Musculoskeletal:     Cervical back: Normal range of motion and neck supple.  Skin:    General: Skin is warm and dry.      Comments: Patient with thickened skin, cracking and then more severe areas over the hands and wrists and more diffuse rash over the trunk, worse on the left side than the right side but involving the back, chest and abdominal walls.  No sign of cellulitis or abscess.  Appears to be allergic in nature.  Neurological:     Mental Status: He is alert.    ED Results / Procedures / Treatments   Labs (all labs ordered are listed, but only abnormal results are displayed) Labs Reviewed  RESP PANEL BY RT-PCR (FLU A&B, COVID) [id number]    EKG None  Radiology No results found.  Procedures Procedures   Medications Ordered in ED Medications  dexamethasone (DECADRON) injection 10 mg (has no administration in time range)    ED Course  I have reviewed the triage vital signs and the nursing notes.  Pertinent labs & imaging results that were available during my care of the patient were reviewed by me and considered in my medical decision making (see chart for details).  Patient seen and examined. Plan discussed with patient.   Medications/Fluids: IM Decadron, home with p.o. prednisone taper, topical triamcinolone for spot treatment.  Also prescribed hydroxyzine for itching.  Vital signs reviewed and are as follows: BP (!) 151/87 (BP Location: Right Arm)    Pulse (!) 101    Temp 98.1 F (36.7 C) (Oral)    Resp 18    SpO2 98%   Initial impression: Atopic dermatitis  Encourage PCP follow-up as needed, return with worsening or systemic symptoms such as fever.     MDM Rules/Calculators/A&P                          Patient with what appears to be exacerbation of atopic dermatitis.  He has a history of the same.  This is a particularly severe flare.  No systemic symptoms of illness or fevers.  Will give trial of steroids and antihistamines.     Final Clinical Impression(s) / ED Diagnoses Final diagnoses:  Rash and nonspecific skin eruption    Rx / DC Orders ED Discharge Orders           Ordered    predniSONE (DELTASONE) 20 MG tablet        06/14/21 2315    hydrOXYzine (ATARAX) 25 MG tablet  Every 6 hours PRN        06/14/21 2315    triamcinolone (KENALOG) 0.025 % ointment  2 times daily        06/14/21 2316             Renne Crigler, PA-C 06/14/21 2321    Sabas Sous, MD  06/15/21 0700 ° °

## 2021-08-17 ENCOUNTER — Ambulatory Visit: Payer: Self-pay | Admitting: Emergency Medicine

## 2021-09-08 ENCOUNTER — Ambulatory Visit: Payer: Self-pay | Admitting: Emergency Medicine

## 2021-10-12 ENCOUNTER — Ambulatory Visit: Payer: Self-pay | Admitting: Emergency Medicine

## 2021-10-18 ENCOUNTER — Ambulatory Visit (INDEPENDENT_AMBULATORY_CARE_PROVIDER_SITE_OTHER): Payer: Self-pay

## 2021-10-18 ENCOUNTER — Encounter: Payer: Self-pay | Admitting: Emergency Medicine

## 2021-10-18 ENCOUNTER — Ambulatory Visit (INDEPENDENT_AMBULATORY_CARE_PROVIDER_SITE_OTHER): Payer: Self-pay | Admitting: Emergency Medicine

## 2021-10-18 VITALS — BP 120/70 | HR 72 | Temp 98.1°F | Ht 68.0 in | Wt 198.0 lb

## 2021-10-18 DIAGNOSIS — M489 Spondylopathy, unspecified: Secondary | ICD-10-CM | POA: Insufficient documentation

## 2021-10-18 DIAGNOSIS — M542 Cervicalgia: Secondary | ICD-10-CM

## 2021-10-18 DIAGNOSIS — I10 Essential (primary) hypertension: Secondary | ICD-10-CM

## 2021-10-18 MED ORDER — LISINOPRIL-HYDROCHLOROTHIAZIDE 20-12.5 MG PO TABS: 2.0000 | ORAL_TABLET | Freq: Every day | ORAL | 3 refills | Status: DC

## 2021-10-18 NOTE — Assessment & Plan Note (Signed)
Cervical spine x-rays show significant findings.  Symptomatic. ?Needs neurosurgical evaluation.  Referral placed today. ?

## 2021-10-18 NOTE — Assessment & Plan Note (Signed)
Well-controlled hypertension with normal blood pressure readings at home. ?Continue Zestoretic 20-12.5 mg and amlodipine 10 mg daily. ?Dietary approaches to stop hypertension discussed ?

## 2021-10-18 NOTE — Patient Instructions (Signed)
Hypertension, Adult High blood pressure (hypertension) is when the force of blood pumping through the arteries is too strong. The arteries are the blood vessels that carry blood from the heart throughout the body. Hypertension forces the heart to work harder to pump blood and may cause arteries to become narrow or stiff. Untreated or uncontrolled hypertension can lead to a heart attack, heart failure, a stroke, kidney disease, and other problems. A blood pressure reading consists of a higher number over a lower number. Ideally, your blood pressure should be below 120/80. The first ("top") number is called the systolic pressure. It is a measure of the pressure in your arteries as your heart beats. The second ("bottom") number is called the diastolic pressure. It is a measure of the pressure in your arteries as the heart relaxes. What are the causes? The exact cause of this condition is not known. There are some conditions that result in high blood pressure. What increases the risk? Certain factors may make you more likely to develop high blood pressure. Some of these risk factors are under your control, including: Smoking. Not getting enough exercise or physical activity. Being overweight. Having too much fat, sugar, calories, or salt (sodium) in your diet. Drinking too much alcohol. Other risk factors include: Having a personal history of heart disease, diabetes, high cholesterol, or kidney disease. Stress. Having a family history of high blood pressure and high cholesterol. Having obstructive sleep apnea. Age. The risk increases with age. What are the signs or symptoms? High blood pressure may not cause symptoms. Very high blood pressure (hypertensive crisis) may cause: Headache. Fast or irregular heartbeats (palpitations). Shortness of breath. Nosebleed. Nausea and vomiting. Vision changes. Severe chest pain, dizziness, and seizures. How is this diagnosed? This condition is diagnosed by  measuring your blood pressure while you are seated, with your arm resting on a flat surface, your legs uncrossed, and your feet flat on the floor. The cuff of the blood pressure monitor will be placed directly against the skin of your upper arm at the level of your heart. Blood pressure should be measured at least twice using the same arm. Certain conditions can cause a difference in blood pressure between your right and left arms. If you have a high blood pressure reading during one visit or you have normal blood pressure with other risk factors, you may be asked to: Return on a different day to have your blood pressure checked again. Monitor your blood pressure at home for 1 week or longer. If you are diagnosed with hypertension, you may have other blood or imaging tests to help your health care provider understand your overall risk for other conditions. How is this treated? This condition is treated by making healthy lifestyle changes, such as eating healthy foods, exercising more, and reducing your alcohol intake. You may be referred for counseling on a healthy diet and physical activity. Your health care provider may prescribe medicine if lifestyle changes are not enough to get your blood pressure under control and if: Your systolic blood pressure is above 130. Your diastolic blood pressure is above 80. Your personal target blood pressure may vary depending on your medical conditions, your age, and other factors. Follow these instructions at home: Eating and drinking  Eat a diet that is high in fiber and potassium, and low in sodium, added sugar, and fat. An example of this eating plan is called the DASH diet. DASH stands for Dietary Approaches to Stop Hypertension. To eat this way: Eat   plenty of fresh fruits and vegetables. Try to fill one half of your plate at each meal with fruits and vegetables. Eat whole grains, such as whole-wheat pasta, brown rice, or whole-grain bread. Fill about one  fourth of your plate with whole grains. Eat or drink low-fat dairy products, such as skim milk or low-fat yogurt. Avoid fatty cuts of meat, processed or cured meats, and poultry with skin. Fill about one fourth of your plate with lean proteins, such as fish, chicken without skin, beans, eggs, or tofu. Avoid pre-made and processed foods. These tend to be higher in sodium, added sugar, and fat. Reduce your daily sodium intake. Many people with hypertension should eat less than 1,500 mg of sodium a day. Do not drink alcohol if: Your health care provider tells you not to drink. You are pregnant, may be pregnant, or are planning to become pregnant. If you drink alcohol: Limit how much you have to: 0-1 drink a day for women. 0-2 drinks a day for men. Know how much alcohol is in your drink. In the [place] one drink equals one 12 oz bottle of beer (355 mL), one 5 oz glass of wine (148 mL), or one 1 oz glass of hard liquor (44 mL). Lifestyle  Work with your health care provider to maintain a healthy body weight or to lose weight. Ask what an ideal weight is for you. Get at least 30 minutes of exercise that causes your heart to beat faster (aerobic exercise) most days of the week. Activities may include walking, swimming, or biking. Include exercise to strengthen your muscles (resistance exercise), such as Pilates or lifting weights, as part of your weekly exercise routine. Try to do these types of exercises for 30 minutes at least 3 days a week. Do not use any products that contain nicotine or tobacco. These products include cigarettes, chewing tobacco, and vaping devices, such as e-cigarettes. If you need help quitting, ask your health care provider. Monitor your blood pressure at home as told by your health care provider. Keep all follow-up visits. This is important. Medicines Take over-the-counter and prescription medicines only as told by your health care provider. Follow directions carefully. Blood  pressure medicines must be taken as prescribed. Do not skip doses of blood pressure medicine. Doing this puts you at risk for problems and can make the medicine less effective. Ask your health care provider about side effects or reactions to medicines that you should watch for. Contact a health care provider if you: Think you are having a reaction to a medicine you are taking. Have headaches that keep coming back (recurring). Feel dizzy. Have swelling in your ankles. Have trouble with your vision. Get help right away if you: Develop a severe headache or confusion. Have unusual weakness or numbness. Feel faint. Have severe pain in your chest or abdomen. Vomit repeatedly. Have trouble breathing. These symptoms may be an emergency. Get help right away. Call 911. Do not wait to see if the symptoms will go away. Do not drive yourself to the hospital. Summary Hypertension is when the force of blood pumping through your arteries is too strong. If this condition is not controlled, it may put you at risk for serious complications. Your personal target blood pressure may vary depending on your medical conditions, your age, and other factors. For most people, a normal blood pressure is less than 120/80. Hypertension is treated with lifestyle changes, medicines, or a combination of both. Lifestyle changes include losing weight, eating a healthy,   low-sodium diet, exercising more, and limiting alcohol. This information is not intended to replace advice given to you by your health care provider. Make sure you discuss any questions you have with your health care provider. Document Revised: 04/12/2021 Document Reviewed: 04/12/2021 Elsevier Patient Education  2023 [employer]  

## 2021-10-18 NOTE — Progress Notes (Signed)
Colin HeathStephen Perry Pena ?66 y.o. ? ? ?Chief Complaint  ?Patient presents with  ? Follow-up  ? Neck Pain  ?  Comes and goes throughout the day. Clicking noise in his neck during movement   ? ? ?HISTORY OF PRESENT ILLNESS: ?This is a [age] y.o. male with history of hypertension here for follow-up and medication refill. ?Normal blood pressure readings at home. ?Also during the last couple weeks noticed clicking noise in his neck during certain movements. ?Couple weeks ago during sleep woke up with shooting pain to right arm with hot sensation which stayed with him for couple days radiating up into right side of his neck.  Ibuprofen made it better.  No other associated symptoms. ?BP Readings from Last 3 Encounters:  ?10/18/21 (!) 150/76  ?06/14/21 (!) 151/87  ?02/16/21 126/70  ? ? ? ?Neck Pain  ?Pertinent negatives include no chest pain, fever or headaches.  ? ? ?Prior to Admission medications   ?Medication Sig Start Date End Date Taking? Authorizing Provider  ?amLODipine (NORVASC) 10 MG tablet Take 1 tablet (10 mg total) by mouth daily. 02/16/21  Yes SagardiaEilleen Kempf, Terryn Rosenkranz Jose, MD  ?aspirin 81 MG chewable tablet Chew 81 mg by mouth daily.   Yes [provider]  ?hydrOXYzine (ATARAX) 25 MG tablet Take 1 tablet (25 mg total) by mouth every 6 (six) hours as needed for itching. 06/14/21  Yes Renne CriglerGeiple, Joshua, PA-C  ?triamcinolone (KENALOG) 0.025 % ointment Apply 1 application topically 2 (two) times daily. 06/14/21  Yes Renne CriglerGeiple, Joshua, PA-C  ?lisinopril-hydrochlorothiazide (ZESTORETIC) 20-12.5 MG tablet Take 2 tablets by mouth daily. 02/16/21 05/17/21  Georgina QuintSagardia, Tauna Macfarlane Jose, MD  ?NONFORMULARY OR COMPOUNDED ITEM Triamcinolone 0.1%/Eucerin. Unknown ratio.1 application topically daily as directed. 06/14/21   Georgina QuintSagardia, Kwamaine Cuppett Jose, MD  ?predniSONE (DELTASONE) 20 MG tablet 3 Tabs PO Days 1-3, then 2 tabs PO Days 4-6, then 1 tab PO Day 7-9, then Half Tab PO Day 10-12 ?Patient not taking: Reported on 10/18/2021 06/14/21   Renne CriglerGeiple, Joshua,  PA-C  ? ? ?No Known Allergies ? ?Patient Active Problem List  ? Diagnosis Date Noted  ? Hypertension 05/09/2012  ? ? ?Past Medical History:  ?Diagnosis Date  ? Hypertension   ? ? ?No past surgical history on file. ? ?Social History  ? ?Socioeconomic History  ? Marital status: Married  ?  Spouse name: Not on file  ? Number of children: Not on file  ? Years of education: Not on file  ? Highest education level: Not on file  ?Occupational History  ? Not on file  ?Tobacco Use  ? Smoking status: Former  ? Smokeless tobacco: Never  ?Substance and Sexual Activity  ? Alcohol use: No  ?  Alcohol/week: 0.0 standard drinks  ? Drug use: No  ? Sexual activity: Not on file  ?Other Topics Concern  ? Not on file  ?Social History Narrative  ? Not on file  ? ?Social Determinants of Health  ? ?Financial Resource Strain: Not on file  ?Food Insecurity: Not on file  ?Transportation Needs: Not on file  ?Physical Activity: Not on file  ?Stress: Not on file  ?Social Connections: Not on file  ?Intimate Partner Violence: Not on file  ? ? ?Family History  ?Problem Relation Age of Onset  ? Cancer Father   ? Hypertension Sister   ? Cancer Maternal Aunt   ? Heart disease Paternal Uncle   ? Cancer Maternal Uncle   ? Cancer Maternal Uncle   ? Cancer Paternal Uncle   ?  Cancer Paternal Uncle   ? ? ? ?Review of Systems  ?Constitutional: Negative.  Negative for chills and fever.  ?HENT: Negative.    ?Respiratory:  Negative for cough and shortness of breath.   ?Cardiovascular: Negative.  Negative for chest pain and palpitations.  ?Gastrointestinal:  Negative for abdominal pain, nausea and vomiting.  ?Genitourinary: Negative.   ?Musculoskeletal:  Positive for neck pain.  ?Skin: Negative.  Negative for rash.  ?Neurological:  Negative for dizziness, focal weakness and headaches.  ?All other systems reviewed and are negative. ? ?Today's Vitals  ? 10/18/21 0957 10/18/21 1005  ?BP: (!) 146/84 (!) 150/76  ?Pulse: 72   ?Temp: 98.1 ?F (36.7 ?C)   ?TempSrc: Oral    ?SpO2: 98%   ?Weight: 198 lb (89.8 kg)   ?Height: 5\' 8"  (1.727 m)   ? ?Body mass index is 30.11 kg/m?. ?Wt Readings from Last 3 Encounters:  ?10/18/21 198 lb (89.8 kg)  ?02/16/21 191 lb (86.6 kg)  ?07/14/20 197 lb (89.4 kg)  ? ? ?Physical Exam ?Vitals reviewed.  ?Constitutional:   ?   Appearance: Normal appearance.  ?HENT:  ?   Head: Normocephalic.  ?Eyes:  ?   Extraocular Movements: Extraocular movements intact.  ?   Pupils: Pupils are equal, round, and reactive to light.  ?Cardiovascular:  ?   Rate and Rhythm: Normal rate and regular rhythm.  ?   Pulses: Normal pulses.  ?   Heart sounds: Normal heart sounds.  ?Pulmonary:  ?   Effort: Pulmonary effort is normal.  ?   Breath sounds: Normal breath sounds.  ?Musculoskeletal:     ?   General: No tenderness or deformity.  ?   Cervical back: Normal range of motion. No tenderness.  ?Lymphadenopathy:  ?   Cervical: No cervical adenopathy.  ?Skin: ?   General: Skin is warm and dry.  ?   Capillary Refill: Capillary refill takes less than 2 seconds.  ?Neurological:  ?   General: No focal deficit present.  ?   Mental Status: He is alert and oriented to person, place, and time.  ?Psychiatric:     ?   Mood and Affect: Mood normal.     ?   Behavior: Behavior normal.  ? ?DG Cervical Spine Complete ? ?Result Date: 10/18/2021 ?CLINICAL DATA:  Neck pain with right arm paresthesia EXAM: CERVICAL SPINE - COMPLETE 4+ VIEW COMPARISON:  Cervical spine radiographs 11/06/2005 FINDINGS: Mild anterolisthesis C4-5.  No fracture or mass. Progressive degenerative changes since 2007. Multilevel disc and facet degeneration. Disc degeneration and spurring most prominent C5-6 and C6-7 Moderate to severe right foraminal encroachment at C3-4 has progressed. Moderate right foraminal encroachment at C4-5 has progressed. Moderate to severe foraminal encroachment C5-6 has progressed. Severe left foraminal encroachment C5-6 and C6-7 due to spurring has progressed. Mild left foraminal narrowing C3-4 and  C4-5. IMPRESSION: Multilevel cervical spondylosis with bilateral foraminal encroachment. Progressive stenosis and degenerative change since 2007. Electronically Signed   By: 2008 M.D.   On: 10/18/2021 11:06   ? ? ?ASSESSMENT & PLAN: ?Problem List Items Addressed This Visit   ? ?  ? Cardiovascular and Mediastinum  ? Essential hypertension  ?  Well-controlled hypertension with normal blood pressure readings at home. ?Continue Zestoretic 20-12.5 mg and amlodipine 10 mg daily. ?Dietary approaches to stop hypertension discussed ? ?  ?  ? Relevant Medications  ? lisinopril-hydrochlorothiazide (ZESTORETIC) 20-12.5 MG tablet  ?  ? Other  ? Musculoskeletal neck pain - Primary  ?  Secondary to cervical spine disease.  Advised to take Tylenol and or Advil as needed. ?Referred to neurosurgeon for further evaluation. ? ?  ?  ? Relevant Orders  ? DG Cervical Spine Complete (Completed)  ? Cervical spine disease  ?  Cervical spine x-rays show significant findings.  Symptomatic. ?Needs neurosurgical evaluation.  Referral placed today. ? ?  ?  ? Relevant Orders  ? Ambulatory referral to Neurosurgery  ? ?Patient Instructions  ?Hypertension, Adult ?High blood pressure (hypertension) is when the force of blood pumping through the arteries is too strong. The arteries are the blood vessels that carry blood from the heart throughout the body. Hypertension forces the heart to work harder to pump blood and may cause arteries to become narrow or stiff. Untreated or uncontrolled hypertension can lead to a heart attack, heart failure, a stroke, kidney disease, and other problems. ?A blood pressure reading consists of a higher number over a lower number. Ideally, your blood pressure should be below 120/80. The first ("top") number is called the systolic pressure. It is a measure of the pressure in your arteries as your heart beats. The second ("bottom") number is called the diastolic pressure. It is a measure of the pressure in your  arteries as the heart relaxes. ?What are the causes? ?The exact cause of this condition is not known. There are some conditions that result in high blood pressure. ?What increases the risk? ?Certain factors

## 2021-10-18 NOTE — Assessment & Plan Note (Signed)
Secondary to cervical spine disease.  Advised to take Tylenol and or Advil as needed. ?Referred to neurosurgeon for further evaluation. ?

## 2022-01-05 ENCOUNTER — Inpatient Hospital Stay (HOSPITAL_COMMUNITY)
Admission: EM | Admit: 2022-01-05 | Discharge: 2022-01-11 | DRG: 243 | Disposition: A | Discharge status: Home / Self Care | Payer: Medicare Other | Attending: Cardiology | Admitting: Cardiology

## 2022-01-05 ENCOUNTER — Emergency Department (HOSPITAL_COMMUNITY): Payer: Medicare Other

## 2022-01-05 ENCOUNTER — Encounter (HOSPITAL_COMMUNITY): Payer: Self-pay | Admitting: Emergency Medicine

## 2022-01-05 DIAGNOSIS — I252 Old myocardial infarction: Secondary | ICD-10-CM | POA: Diagnosis not present

## 2022-01-05 DIAGNOSIS — Z809 Family history of malignant neoplasm, unspecified: Secondary | ICD-10-CM

## 2022-01-05 DIAGNOSIS — R0609 Other forms of dyspnea: Principal | ICD-10-CM | POA: Diagnosis present

## 2022-01-05 DIAGNOSIS — N179 Acute kidney failure, unspecified: Secondary | ICD-10-CM | POA: Diagnosis present

## 2022-01-05 DIAGNOSIS — Z8249 Family history of ischemic heart disease and other diseases of the circulatory system: Secondary | ICD-10-CM | POA: Diagnosis not present

## 2022-01-05 DIAGNOSIS — E7800 Pure hypercholesterolemia, unspecified: Secondary | ICD-10-CM | POA: Diagnosis present

## 2022-01-05 DIAGNOSIS — I10 Essential (primary) hypertension: Secondary | ICD-10-CM | POA: Diagnosis present

## 2022-01-05 DIAGNOSIS — I442 Atrioventricular block, complete: Secondary | ICD-10-CM | POA: Diagnosis present

## 2022-01-05 DIAGNOSIS — Z79899 Other long term (current) drug therapy: Secondary | ICD-10-CM | POA: Diagnosis not present

## 2022-01-05 DIAGNOSIS — I471 Supraventricular tachycardia: Secondary | ICD-10-CM | POA: Diagnosis not present

## 2022-01-05 DIAGNOSIS — R931 Abnormal findings on diagnostic imaging of heart and coronary circulation: Secondary | ICD-10-CM | POA: Diagnosis present

## 2022-01-05 DIAGNOSIS — I2510 Atherosclerotic heart disease of native coronary artery without angina pectoris: Secondary | ICD-10-CM | POA: Diagnosis present

## 2022-01-05 DIAGNOSIS — Z87891 Personal history of nicotine dependence: Secondary | ICD-10-CM

## 2022-01-05 DIAGNOSIS — Z950 Presence of cardiac pacemaker: Secondary | ICD-10-CM | POA: Diagnosis not present

## 2022-01-05 DIAGNOSIS — Z7982 Long term (current) use of aspirin: Secondary | ICD-10-CM | POA: Diagnosis not present

## 2022-01-05 DIAGNOSIS — I441 Atrioventricular block, second degree: Secondary | ICD-10-CM

## 2022-01-05 LAB — CBC WITH DIFFERENTIAL/PLATELET
Abs Immature Granulocytes: 0.02 10*3/uL (ref 0.00–0.07)
Basophils Absolute: 0.1 10*3/uL (ref 0.0–0.1)
Basophils Relative: 1 %
Eosinophils Absolute: 0.1 10*3/uL (ref 0.0–0.5)
Eosinophils Relative: 1 %
HCT: 41.8 % (ref 39.0–52.0)
Hemoglobin: 14 g/dL (ref 13.0–17.0)
Immature Granulocytes: 0 %
Lymphocytes Relative: 12 %
Lymphs Abs: 0.7 10*3/uL (ref 0.7–4.0)
MCH: 30.2 pg (ref 26.0–34.0)
MCHC: 33.5 g/dL (ref 30.0–36.0)
MCV: 90.3 fL (ref 80.0–100.0)
Monocytes Absolute: 0.4 10*3/uL (ref 0.1–1.0)
Monocytes Relative: 6 %
Neutro Abs: 4.8 10*3/uL (ref 1.7–7.7)
Neutrophils Relative %: 80 %
Platelets: 188 10*3/uL (ref 150–400)
RBC: 4.63 MIL/uL (ref 4.22–5.81)
RDW: 13 % (ref 11.5–15.5)
WBC: 6 10*3/uL (ref 4.0–10.5)
nRBC: 0 % (ref 0.0–0.2)

## 2022-01-05 LAB — COMPREHENSIVE METABOLIC PANEL
ALT: 48 U/L — ABNORMAL HIGH (ref 0–44)
AST: 46 U/L — ABNORMAL HIGH (ref 15–41)
Albumin: 4 g/dL (ref 3.5–5.0)
Alkaline Phosphatase: 66 U/L (ref 38–126)
Anion gap: 7 (ref 5–15)
BUN: 18 mg/dL (ref 8–23)
CO2: 24 mmol/L (ref 22–32)
Calcium: 8.9 mg/dL (ref 8.9–10.3)
Chloride: 111 mmol/L (ref 98–111)
Creatinine, Ser: 1.37 mg/dL — ABNORMAL HIGH (ref 0.61–1.24)
GFR, Estimated: 57 mL/min — ABNORMAL LOW (ref >60)
Glucose, Bld: 122 mg/dL — ABNORMAL HIGH (ref 70–99)
Potassium: 4.4 mmol/L (ref 3.5–5.1)
Sodium: 142 mmol/L (ref 135–145)
Total Bilirubin: 0.5 mg/dL (ref 0.3–1.2)
Total Protein: 7.1 g/dL (ref 6.5–8.1)

## 2022-01-05 LAB — TROPONIN I (HIGH SENSITIVITY)
Troponin I (High Sensitivity): 8 ng/L (ref <18)
Troponin I (High Sensitivity): 9 ng/L (ref <18)

## 2022-01-05 LAB — D-DIMER, QUANTITATIVE: D-Dimer, Quant: 0.56 ug/mL-FEU — ABNORMAL HIGH (ref 0.00–0.50)

## 2022-01-05 MED ORDER — ALBUTEROL SULFATE HFA 108 (90 BASE) MCG/ACT IN AERS: 2.0000 | INHALATION_SPRAY | RESPIRATORY_TRACT | Status: DC | PRN

## 2022-01-05 NOTE — ED Provider Notes (Signed)
[facility] DEPT Provider Note   CSN: [id number] Arrival date & time: 01/05/22  1251     History  Chief Complaint  Patient presents with   Shortness of Breath    Colin Pena is a 66 y.o. male.  Patient is a 66 year old male with a history of hypertension on amlodipine and lisinopril who is presenting today with new onset of exertional dyspnea.  Patient reports that symptoms started approximately 9 to 10 days ago when he was walking up some stairs.  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  He reports he made up the stairs but then had to rest after that.  Since that time every day symptoms have become slightly worse.  He reports he only notices it during the day when he is outside in the heat but it has become more and more prominent with less strenuous activity.  He does not get any chest pain but reports when he is short of breath he also gets lightheaded and feels like he might pass out.  It does resolve as soon as he sits down and rests.  He is not experience this while he is inside where at school or in the morning when he wakes up.  He has no orthopnea, unilateral leg pain or swelling, lower extremity edema, cough, congestion.  He does not use tobacco products, alcohol or drugs.  He has no known heart problems but reports that his father had an MI in his late 39s and in his 22s as well as a paternal uncle.  He has never been evaluated by a cardiologist or had any cardiac work-up in the past.  He reports never having symptoms like this previously.  The history is provided by the patient and medical records.  Shortness of Breath      Home Medications Prior to Admission medications   Medication Sig Start Date End Date Taking? Authorizing Provider  amLODipine (NORVASC) 10 MG tablet Take 1 tablet (10 mg total) by mouth  daily. 02/16/21   Horald Pollen, MD  aspirin 81 MG chewable tablet Chew 81 mg by mouth daily.    [provider]  hydrOXYzine (ATARAX) 25 MG tablet Take 1 tablet (25 mg total) by mouth every 6 (six) hours as needed for itching. 06/14/21   Carlisle Cater, PA-C  lisinopril-hydrochlorothiazide (ZESTORETIC) 20-12.5 MG tablet Take 2 tablets by mouth daily. 10/18/21 10/13/22  Horald Pollen, MD  NONFORMULARY OR COMPOUNDED ITEM Triamcinolone 0.1%/Eucerin. Unknown ratio.1 application topically daily as directed. 06/14/21   Horald Pollen, MD  predniSONE (DELTASONE) 20 MG tablet 3 Tabs PO Days 1-3, then 2 tabs PO Days 4-6, then 1 tab PO Day 7-9, then Half Tab PO Day 10-12 Patient not taking: Reported on 10/18/2021 06/14/21   Carlisle Cater, PA-C  triamcinolone (KENALOG) 0.025 % ointment Apply 1 application topically 2 (two) times daily. 06/14/21   Carlisle Cater, PA-C      Allergies    Patient has no known allergies.    Review of Systems   Review of Systems  Respiratory:  Positive for shortness of breath.     Physical Exam Updated Vital Signs BP (!) 172/86   Pulse (!) 54   Temp 98.1 F (36.7 C)   Resp 19   SpO2 99%  Physical Exam Vitals and nursing note reviewed.  Constitutional:  General: He is not in acute distress.    Appearance: He is well-developed.  HENT:     Head: Normocephalic and atraumatic.  Eyes:     Conjunctiva/sclera: Conjunctivae normal.     Pupils: Pupils are equal, round, and reactive to light.  Cardiovascular:     Rate and Rhythm: Normal rate and regular rhythm.     Heart sounds: No murmur heard. Pulmonary:     Effort: Pulmonary effort is normal. No respiratory distress.     Breath sounds: Normal breath sounds. No wheezing or rales.  Abdominal:     General: There is no distension.     Palpations: Abdomen is soft.     Tenderness: There is no abdominal tenderness. There is no guarding or rebound.  Musculoskeletal:        General: No  tenderness. Normal range of motion.     Cervical back: Normal range of motion and neck supple.     Right lower leg: No tenderness.     Left lower leg: No tenderness.  Skin:    General: Skin is warm and dry.     Findings: No erythema or rash.  Neurological:     General: No focal deficit present.     Mental Status: He is alert and oriented to person, place, and time.  Psychiatric:        Behavior: Behavior normal.     ED Results / Procedures / Treatments   Labs (all labs ordered are listed, but only abnormal results are displayed) Labs Reviewed  COMPREHENSIVE METABOLIC PANEL - Abnormal; Notable for the following components:      Result Value   Glucose, Bld 122 (*)    Creatinine, Ser 1.37 (*)    AST 46 (*)    ALT 48 (*)    GFR, Estimated 57 (*)    All other components within normal limits  D-DIMER, QUANTITATIVE - Abnormal; Notable for the following components:   D-Dimer, Quant 0.56 (*)    All other components within normal limits  CBC WITH DIFFERENTIAL/PLATELET  TROPONIN I (HIGH SENSITIVITY)  TROPONIN I (HIGH SENSITIVITY)    EKG EKG Interpretation  Date/Time:  Thursday January 05 2022 13:04:34 EDT Ventricular Rate:  54 PR Interval:  153 QRS Duration: 132 QT Interval:  519 QTC Calculation: 492 R Axis:   70 Text Interpretation: Sinus rhythm Right bundle branch block new RBBB since prior 1/17 Confirmed by Meridee Score [phone]) on 01/05/2022 1:07:58 PM  Radiology DG Chest 2 View  Result Date: 01/05/2022 CLINICAL DATA:  Dyspnea with exertion. EXAM: CHEST - 2 VIEW COMPARISON:  June 28, 2015. FINDINGS: The heart size and mediastinal contours are within normal limits. Left lung is clear. Elevated right hemidiaphragm is noted with minimal right basilar subsegmental atelectasis. The visualized skeletal structures are unremarkable. IMPRESSION: Elevated right hemidiaphragm with minimal right basilar subsegmental atelectasis. Electronically Signed   By: Lupita Raider M.D.   On:  01/05/2022 13:52    Procedures Procedures    Medications Ordered in ED Medications  albuterol (VENTOLIN HFA) 108 (90 Base) MCG/ACT inhaler 2 puff (has no administration in time range)    ED Course/ Medical Decision Making/ A&P                           Medical Decision Making Amount and/or Complexity of Data Reviewed External Data Reviewed: notes.    Details: pcp Labs: ordered. Decision-making details documented in ED Course. Radiology: ordered and independent  interpretation performed. Decision-making details documented in ED Course. ECG/medicine tests: ordered and independent interpretation performed. Decision-making details documented in ED Course.  Risk Prescription drug management. Decision regarding hospitalization.   Pt with multiple medical problems and comorbidities and presenting today with a complaint that caries a high risk for morbidity and mortality.  Here today with dyspnea on exertion that has been intermittent but gradually worsening and dizziness.  Patient denies any chest pain however symptoms are concerning for possible ACS or stable angina versus PE.  Low suspicion for dissection, pericarditis, myocarditis, electrolyte abnormality.  Patient does not have history of lung disease has never been a smoker and on exam has clear lungs bilaterally.  I independently interpreted patient's EKG today which shows a new right bundle branch block but also maybe AV block when compared with an EKG done prior.  I independently interpreted patient's labs and his delta troponin is negative, CMP with creatinine 1.37 and minimally elevated LFTs with an AST of 46 and ALT of 48, CBC is within normal limits. I have independently visualized and interpreted pt's images today.  Chest x-ray without acute findings.  At rest patient has no symptoms and is well-appearing here with no acute abnormal exam findings.  Patient is a heart score of 5 based on age and risk factors.  We will check a D-dimer  to ensure there is no need to further evaluate for PE but he is a low risk Wells criteria.  If this is negative we will discuss with cardiology for either close outpatient follow-up or admission with definitive testing.  11:23 PM D-dimer is 0.56 which is neg age adjusted.  Will discuss with cardiology.  11:23 PM Spoke with Cardiology and they are concerned pt has 2nd degree heart block which may be causing his symptoms.  [facility] would [facility].  Will place orders.  Findings discussed with pt.  On repeat EKG appears to be in sinus rhythm at this time.         Final Clinical Impression(s) / ED Diagnoses Final diagnoses:  DOE (dyspnea on exertion)  Second degree heart block    Rx / DC Orders ED Discharge Orders     None         Gwyneth Sprout, MD 01/05/22 [id number]

## 2022-01-05 NOTE — ED Provider Triage Note (Signed)
Emergency Medicine Provider Triage Evaluation Note  Colin Pena , a 66 y.o. male  was evaluated in triage.  Pt complains of shortness of breath.  Patient states that he has been increasingly short of breath over the past 3 days.  He states that walking 20 feet on minimal physical activity has caused increased shortness of breath.  It is relieved with rest.  He denies no overt chest pain.  Although he does state that today he began to have "tingling sensation in his right shoulder with radiation into his right neck during these episodes of shortness of breath."  Denies history of diabetes, hyperlipidemia.  Does state he has hypertension.  Denies smoking history or cocaine use.  Denies fever, cough.  Review of Systems  Positive: See above Negative:   Physical Exam  BP (!) 143/79 (BP Location: Left Arm)   Pulse (!) 56   Temp 98 F (36.7 C) (Oral)   Resp 18   SpO2 96%  Gen:   Awake, no distress   Resp:  Normal effort  MSK:   Moves extremities without difficulty  Other:  No obvious murmur gallop or rub.  No obvious rales, rhonchi, wheeze auscultated on respiratory exam.  No lower extremity edema noticed.  Medical Decision Making  Medically screening exam initiated at 1:21 PM.  Appropriate orders placed.  Joeph Szatkowski was informed that the remainder of the evaluation will be completed by another provider, this initial triage assessment does not replace that evaluation, and the importance of remaining in the ED until their evaluation is complete.     Peter Garter, [place] 01/05/22 1323

## 2022-01-05 NOTE — ED Triage Notes (Signed)
Patient here from home reporting sob with exertion, difficulty walking for long distances. Denies chest pain.

## 2022-01-06 ENCOUNTER — Encounter (HOSPITAL_COMMUNITY): Payer: Self-pay | Admitting: Internal Medicine

## 2022-01-06 ENCOUNTER — Encounter (HOSPITAL_COMMUNITY)
Admission: EM | Disposition: A | Discharge status: Home / Self Care | Payer: Self-pay | Source: Home / Self Care | Attending: Cardiology

## 2022-01-06 ENCOUNTER — Inpatient Hospital Stay (HOSPITAL_COMMUNITY): Payer: Medicare Other

## 2022-01-06 DIAGNOSIS — I2510 Atherosclerotic heart disease of native coronary artery without angina pectoris: Secondary | ICD-10-CM

## 2022-01-06 DIAGNOSIS — R0609 Other forms of dyspnea: Principal | ICD-10-CM

## 2022-01-06 DIAGNOSIS — I10 Essential (primary) hypertension: Secondary | ICD-10-CM

## 2022-01-06 DIAGNOSIS — I442 Atrioventricular block, complete: Secondary | ICD-10-CM | POA: Diagnosis present

## 2022-01-06 DIAGNOSIS — I441 Atrioventricular block, second degree: Secondary | ICD-10-CM

## 2022-01-06 DIAGNOSIS — R931 Abnormal findings on diagnostic imaging of heart and coronary circulation: Secondary | ICD-10-CM

## 2022-01-06 HISTORY — PX: LEFT HEART CATH AND CORONARY ANGIOGRAPHY: CATH118249

## 2022-01-06 LAB — ECHOCARDIOGRAM COMPLETE
AR max vel: 2.97 cm2
AV Peak grad: 7 mmHg
Ao pk vel: 1.32 m/s
Area-P 1/2: 4.19 cm2
Calc EF: 57.4 %
S' Lateral: 2.8 cm
Single Plane A2C EF: 58.1 %
Single Plane A4C EF: 57.6 %

## 2022-01-06 LAB — BASIC METABOLIC PANEL
Anion gap: 11 (ref 5–15)
BUN: 16 mg/dL (ref 8–23)
CO2: 21 mmol/L — ABNORMAL LOW (ref 22–32)
Calcium: 8.8 mg/dL — ABNORMAL LOW (ref 8.9–10.3)
Chloride: 107 mmol/L (ref 98–111)
Creatinine, Ser: 0.95 mg/dL (ref 0.61–1.24)
GFR, Estimated: >60 mL/min (ref >60)
Glucose, Bld: 114 mg/dL — ABNORMAL HIGH (ref 70–99)
Potassium: 3.8 mmol/L (ref 3.5–5.1)
Sodium: 139 mmol/L (ref 135–145)

## 2022-01-06 LAB — TSH: TSH: 1.54 u[IU]/mL (ref 0.350–4.500)

## 2022-01-06 LAB — TROPONIN I (HIGH SENSITIVITY): Troponin I (High Sensitivity): 10 ng/L (ref <18)

## 2022-01-06 SURGERY — LEFT HEART CATH AND CORONARY ANGIOGRAPHY
Anesthesia: LOCAL

## 2022-01-06 MED ORDER — ONDANSETRON HCL 4 MG/2ML IJ SOLN
4.0000 mg | Freq: Four times a day (QID) | INTRAMUSCULAR | Status: DC | PRN
  Administered 2022-01-06 – 2022-01-11 (×5): 4 mg via INTRAVENOUS
  Filled 2022-01-06 (×5): qty 2

## 2022-01-06 MED ORDER — NITROGLYCERIN 0.4 MG SL SUBL: 0.4000 mg | SUBLINGUAL_TABLET | SUBLINGUAL | Status: DC | PRN

## 2022-01-06 MED ORDER — SODIUM CHLORIDE 0.9 % WEIGHT BASED INFUSION
3.0000 mL/kg/h | INTRAVENOUS | Status: AC
  Administered 2022-01-06: 3 mL/kg/h via INTRAVENOUS

## 2022-01-06 MED ORDER — SODIUM CHLORIDE 0.9% FLUSH: 3.0000 mL | INTRAVENOUS | Status: DC | PRN

## 2022-01-06 MED ORDER — SODIUM CHLORIDE 0.9 % IV SOLN: INTRAVENOUS | Status: AC

## 2022-01-06 MED ORDER — LIDOCAINE HCL (PF) 1 % IJ SOLN
INTRAMUSCULAR | Status: AC
  Filled 2022-01-06: qty 30

## 2022-01-06 MED ORDER — HYDRALAZINE HCL 20 MG/ML IJ SOLN
10.0000 mg | INTRAMUSCULAR | Status: DC | PRN
Start: 2022-01-06 — End: 2022-01-06

## 2022-01-06 MED ORDER — HEPARIN (PORCINE) IN NACL 1000-0.9 UT/500ML-% IV SOLN
INTRAVENOUS | Status: AC
  Filled 2022-01-06: qty 1000

## 2022-01-06 MED ORDER — HEPARIN SODIUM (PORCINE) 5000 UNIT/ML IJ SOLN
5000.0000 [IU] | Freq: Three times a day (TID) | INTRAMUSCULAR | Status: DC
  Administered 2022-01-06: 5000 [IU] via SUBCUTANEOUS
  Filled 2022-01-06: qty 1

## 2022-01-06 MED ORDER — ASPIRIN 81 MG PO CHEW
81.0000 mg | CHEWABLE_TABLET | ORAL | Status: AC
  Administered 2022-01-06: 81 mg via ORAL

## 2022-01-06 MED ORDER — HEPARIN SODIUM (PORCINE) 1000 UNIT/ML IJ SOLN
INTRAMUSCULAR | Status: DC | PRN
  Administered 2022-01-06: 4000 [IU] via INTRAVENOUS

## 2022-01-06 MED ORDER — VERAPAMIL HCL 2.5 MG/ML IV SOLN
INTRAVENOUS | Status: DC | PRN
  Administered 2022-01-06: 10 mL via INTRA_ARTERIAL

## 2022-01-06 MED ORDER — HYDRALAZINE HCL 20 MG/ML IJ SOLN: 10.0000 mg | INTRAMUSCULAR | Status: DC | PRN

## 2022-01-06 MED ORDER — FENTANYL CITRATE (PF) 100 MCG/2ML IJ SOLN
INTRAMUSCULAR | Status: DC | PRN
  Administered 2022-01-06: 50 ug via INTRAVENOUS

## 2022-01-06 MED ORDER — MIDAZOLAM HCL 2 MG/2ML IJ SOLN
INTRAMUSCULAR | Status: DC | PRN
  Administered 2022-01-06: 1 mg via INTRAVENOUS

## 2022-01-06 MED ORDER — SODIUM CHLORIDE 0.9% FLUSH
3.0000 mL | Freq: Two times a day (BID) | INTRAVENOUS | Status: DC
  Administered 2022-01-07 – 2022-01-11 (×9): 3 mL via INTRAVENOUS

## 2022-01-06 MED ORDER — ACETAMINOPHEN 325 MG PO TABS
650.0000 mg | ORAL_TABLET | ORAL | Status: DC | PRN
  Administered 2022-01-10 – 2022-01-11 (×2): 650 mg via ORAL
  Filled 2022-01-06 (×2): qty 2

## 2022-01-06 MED ORDER — IOHEXOL 350 MG/ML SOLN
INTRAVENOUS | Status: DC | PRN
  Administered 2022-01-06: 45 mL

## 2022-01-06 MED ORDER — LIDOCAINE HCL (PF) 1 % IJ SOLN
INTRAMUSCULAR | Status: DC | PRN
  Administered 2022-01-06: 2 mL

## 2022-01-06 MED ORDER — AMLODIPINE BESYLATE 5 MG PO TABS
10.0000 mg | ORAL_TABLET | Freq: Every day | ORAL | Status: DC
  Administered 2022-01-06: 10 mg via ORAL
  Filled 2022-01-06: qty 2

## 2022-01-06 MED ORDER — ENOXAPARIN SODIUM 40 MG/0.4ML IJ SOSY
40.0000 mg | PREFILLED_SYRINGE | Freq: Every day | INTRAMUSCULAR | Status: DC
  Administered 2022-01-07 – 2022-01-08 (×2): 40 mg via SUBCUTANEOUS
  Filled 2022-01-06 (×2): qty 0.4

## 2022-01-06 MED ORDER — HEPARIN SODIUM (PORCINE) 1000 UNIT/ML IJ SOLN
INTRAMUSCULAR | Status: AC
  Filled 2022-01-06: qty 10

## 2022-01-06 MED ORDER — SODIUM CHLORIDE 0.9 % WEIGHT BASED INFUSION
1.0000 mL/kg/h | INTRAVENOUS | Status: DC
  Administered 2022-01-06: 1 mL/kg/h via INTRAVENOUS

## 2022-01-06 MED ORDER — ASPIRIN 81 MG PO TBEC
81.0000 mg | DELAYED_RELEASE_TABLET | Freq: Every day | ORAL | Status: DC
  Administered 2022-01-07 – 2022-01-11 (×5): 81 mg via ORAL
  Filled 2022-01-06 (×5): qty 1

## 2022-01-06 MED ORDER — FENTANYL CITRATE (PF) 100 MCG/2ML IJ SOLN
INTRAMUSCULAR | Status: AC
  Filled 2022-01-06: qty 2

## 2022-01-06 MED ORDER — VERAPAMIL HCL 2.5 MG/ML IV SOLN
INTRAVENOUS | Status: AC
  Filled 2022-01-06: qty 2

## 2022-01-06 MED ORDER — HEPARIN (PORCINE) IN NACL 1000-0.9 UT/500ML-% IV SOLN
INTRAVENOUS | Status: DC | PRN
  Administered 2022-01-06 (×2): 500 mL

## 2022-01-06 MED ORDER — ASPIRIN 81 MG PO CHEW
CHEWABLE_TABLET | ORAL | Status: AC
  Filled 2022-01-06: qty 1

## 2022-01-06 MED ORDER — SODIUM CHLORIDE 0.9% FLUSH
3.0000 mL | Freq: Two times a day (BID) | INTRAVENOUS | Status: DC
  Administered 2022-01-06 – 2022-01-11 (×7): 3 mL via INTRAVENOUS

## 2022-01-06 MED ORDER — SODIUM CHLORIDE 0.9 % IV SOLN: 250.0000 mL | INTRAVENOUS | Status: DC | PRN

## 2022-01-06 MED ORDER — MIDAZOLAM HCL 2 MG/2ML IJ SOLN
INTRAMUSCULAR | Status: AC
  Filled 2022-01-06: qty 2

## 2022-01-06 SURGICAL SUPPLY — 9 items
BAND ZEPHYR COMPRESS 30 LONG (HEMOSTASIS) ×2
CATH 5FR JL3.5 JR4 ANG PIG MP (CATHETERS) ×2
ELECT DEFIB PAD ADLT CADENCE (PAD) ×2
GLIDESHEATH SLEND SS 6F .021 (SHEATH) ×2
INQWIRE 1.5J .035X260CM (WIRE) ×2
KIT HEART LEFT (KITS) ×2
PACK CARDIAC CATHETERIZATION (CUSTOM PROCEDURE TRAY) ×2
TRANSDUCER W/STOPCOCK (MISCELLANEOUS) ×2
TUBING CIL FLEX 10 FLL-RA (TUBING) ×2

## 2022-01-06 NOTE — H&P (Addendum)
History & Physical    Patient ID: Colin Pena MRN: [id number], DOB/AGE: 12-21-1955   Admit date: 01/05/2022   Primary Physician: Georgina Quint, MD Primary Cardiologist: Olga Millers, MD - new  Patient Profile    66 y/o ?  with a history of hypertension, who presented to the emergency department on July 20 with a 10-day history of intermittent dyspnea, fatigue, and diaphoresis, and was found to be in complete heart block.  Past Medical History    Past Medical History:  Diagnosis Date   Hypertension     History reviewed. No pertinent surgical history.   Allergies  No Known Allergies  History of Present Illness    66 year old male with a prior history of hypertension on amlodipine and lisinopril HCTZ as an outpatient.  He has no personal prior cardiac history but his father had an MI in his 71s.  His mother died of heart failure in her late 74s.  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  He enjoys his work and notes that it has been good activity for him.  He was in his usual state of health until approximately 10 days ago, when while making a delivery, he was walking up some steps and had sudden onset of profound dyspnea associated with lightheadedness and diaphoresis.  He did not have any chest discomfort.  He made his delivery and then went back to his car and sat in the air conditioning for a few minutes and then symptoms resolved.  He was able to go on and make additional deliveries that day without any recurrent episodes.  About 2 to 3 days later, he had another episode while walking into a coffee shop, this again resolved within a few minutes of resting and air conditioning.  On July 20, he felt well in the morning but then had to take something out to his car and had recurrent and profound dyspnea, diaphoresis, and presyncope.  Again, he did not have any chest discomfort.  He went back into his home  and symptoms persisted a little bit longer and therefore, he drove himself to the emergency department.  Here, ECG showed 2-1 heart block with a heart rate of 54, and a new right bundle branch block.  Lab work notable for mild acute kidney injury with a creatinine of 1.37, mild elevation LFTs (ALT 48, AST 46), normal blood counts, and normal troponins.  Chest x-ray showed elevated right hemidiaphragm with minimal right basilar subsegmental atelectasis.  Blood pressures have been stable and if anything, he has been hypertensive with pressures trending in the 140s to 170s.  Overnight on telemetry, he has had intermittent complete heart block with ventricular rates in the 30s and atrial rates greater than 100.  Currently he reports mild lightheadedness but is otherwise hemodynamically stable at rest.  Home Medications    Prior to Admission medications   Medication Sig Start Date End Date Taking? Authorizing Provider  amLODipine (NORVASC) 10 MG tablet Take 1 tablet (10 mg total) by mouth daily. 02/16/21  Yes SagardiaEilleen Kempf, MD  aspirin 81 MG chewable tablet Chew 81 mg by mouth daily.   Yes [provider]  lisinopril-hydrochlorothiazide (ZESTORETIC) 20-12.5 MG tablet Take 2 tablets by mouth daily. Patient taking differently: Take 1 tablet by mouth 2 (two) times daily. 10/18/21 10/13/22 Yes Sagardia, Eilleen Kempf, MD  NONFORMULARY OR COMPOUNDED ITEM Triamcinolone 0.1%/Eucerin. Unknown ratio.1 application topically daily  as directed. Patient taking differently: Triamcinolone 0.1%/Eucerin. Unknown ratio.1 application topically daily as needed for eczema 06/14/21  Yes Sagardia, Eilleen Kempf, MD    Family History    Family History  Problem Relation Age of Onset   Heart failure Mother        died @ 5   Cancer Father    Coronary artery disease Father        MI in his 43's   Hypertension Sister    Cancer Maternal Aunt    Cancer Maternal Uncle    Cancer Maternal Uncle    Heart disease  Paternal Uncle    Cancer Paternal Uncle    Cancer Paternal Uncle    He indicated that his mother is deceased. He indicated that his father is deceased. He indicated that his sister is alive. He indicated that his maternal grandmother is deceased. He indicated that his maternal grandfather is deceased. He indicated that his paternal grandmother is deceased. He indicated that his paternal grandfather is deceased. He indicated that all of his three maternal aunts are alive. He indicated that only one of his three maternal uncles is alive. He indicated that his paternal aunt is deceased. He indicated that all of his three paternal uncles are deceased.   Social History    Social History   Socioeconomic History   Marital status: Married    Spouse name: Not on file   Number of children: Not on file   Years of education: Not on file   Highest education level: Not on file  Occupational History   Not on file  Tobacco Use   Smoking status: Former   Smokeless tobacco: Never  Substance and Sexual Activity   Alcohol use: No    Alcohol/week: 0.0 standard drinks of alcohol   Drug use: No   Sexual activity: Not on file  Other Topics Concern   Not on file  Social History Narrative   Lives locally w/ wife.  Retired but working as Building services engineer.  Does not routinely exercise, but stays active.   Social Determinants of Health   Financial Resource Strain: Not on file  Food Insecurity: Not on file  Transportation Needs: Not on file  Physical Activity: Not on file  Stress: Not on file  Social Connections: Not on file  Intimate Partner Violence: Not on file     Review of Systems    General:  No chills, fever, night sweats or weight changes.  Cardiovascular:  No chest pain, +++ dyspnea on exertion associated with diaphoresis and presyncope, no syncope, edema, orthopnea, palpitations, paroxysmal nocturnal dyspnea. Dermatological: No rash, lesions/masses Respiratory: No cough, +++  dyspnea Urologic: No hematuria, dysuria Abdominal:   No nausea, vomiting, diarrhea, bright red blood per rectum, melena, or hematemesis Neurologic:  No visual changes, wkns, changes in mental status. All other systems reviewed and are otherwise negative except as noted above.  Physical Exam    Blood pressure (!) 156/85, pulse 68, temperature 98.7 F (37.1 C), temperature source Oral, resp. rate 15, SpO2 98 %.  General: Pleasant, NAD Psych: Normal affect. Neuro: Alert and oriented X 3. Moves all extremities spontaneously. HEENT: Normal  Neck: Supple without bruits or JVD. Lungs:  Resp regular and unlabored, CTA. Heart: RRR, bradycardic, no s3, s4, or murmurs. Abdomen: Soft, non-tender, non-distended, BS + x 4.  Extremities: No clubbing, cyanosis or edema. DP/PT 2+, Radials 2+ and equal bilaterally.  Labs    Cardiac Enzymes Recent Labs  Lab 01/05/22 1400  01/05/22 1859  TROPONINIHS 8 9      Lab Results  Component Value Date   WBC 6.0 01/05/2022   HGB 14.0 01/05/2022   HCT 41.8 01/05/2022   MCV 90.3 01/05/2022   PLT 188 01/05/2022    Recent Labs  Lab 01/05/22 1400  NA 142  K 4.4  CL 111  CO2 24  BUN 18  CREATININE 1.37*  CALCIUM 8.9  PROT 7.1  BILITOT 0.5  ALKPHOS 66  ALT 48*  AST 46*  GLUCOSE 122*   Lab Results  Component Value Date   CHOL 155 03/08/2015   HDL 35 (L) 03/08/2015   LDLCALC 103 03/08/2015   TRIG 87 03/08/2015   Lab Results  Component Value Date   DDIMER 0.56 (H) 01/05/2022     Radiology Studies    DG Chest 2 View  Result Date: 01/05/2022 CLINICAL DATA:  Dyspnea with exertion. EXAM: CHEST - 2 VIEW COMPARISON:  June 28, 2015. FINDINGS: The heart size and mediastinal contours are within normal limits. Left lung is clear. Elevated right hemidiaphragm is noted with minimal right basilar subsegmental atelectasis. The visualized skeletal structures are unremarkable. IMPRESSION: Elevated right hemidiaphragm with minimal right basilar  subsegmental atelectasis. Electronically Signed   By: Marijo Conception M.D.   On: 01/05/2022 13:52    ECG & Cardiac Imaging    Sinus brady, 54, 2:1 AVB, RBBB (new since 2017, prior IVCD) - personally reviewed.  Assessment & Plan    1.  Complete heart block: Patient presented to the emergency department with a 10-day history of intermittent profound dyspnea associated with diaphoresis and presyncope.  He has not had any chest pain.  On the morning of admission, he had an episode while walking out to his car, prompting ED evaluation.  Here, ECG showed bradycardia with 2-1 AV block and a new right bundle branch block.  Troponins have been normal thus far and he has not had any chest discomfort in the outpatient setting.  Lab work otherwise notable for mild acute kidney injury with a creatinine of 1.37, and mild LFT elevations with an ALT of 48 and AST of 46.  He has been hypertensive since arrival but continues to feel mild lightheadedness and fatigue.  Review of telemetry overnight shows complete heart block with ventricular rates in the 30s and atrial tachycardia with atrial rates in the 120s.  He is not on any AV nodal blocking agents at home.  Keep NPO.  Echocardiogram this morning to evaluate LV function and determine if ischemic evaluation is appropriate prior to electrophysiology evaluation and permanent pacing.  2.  Atrial tachycardia: This is developed since arrival.  See #1.  3.  Essential HTN: Currently hypertensive.  Holding home medications in the setting of above.  We will add as needed hydralazine.  4.  Acute kidney injury: Mild creatinine elevation at 1.37.  Lisinopril HCTZ on hold.  Risk Assessment/Risk Scores:    Signed, Murray Hodgkins, NP 01/06/2022, 8:18 AM As above, patient seen and examined.  Briefly he is a 66 year old male with past medical history of hypertension for evaluation of bradycardia/complete heart block.  He typically does not have dyspnea on exertion, orthopnea,  PND, pedal edema or exertional chest pain.  Over the past 2 weeks he has had multiple episodes of dyspnea, diaphoresis and dizziness with activities.  These typically last 5 to 10 minutes and resolve spontaneously.  His most recent episode was severe and he felt as though he was close to having  syncope.  He presented to the emergency room and was found to be in 2-1 AV block with intermittent complete heart block.  Cardiology now asked to evaluate.  Electrocardiogram shows sinus rhythm with 2-1 AV block and right bundle branch block.  Telemetry shows atrial tachycardia with occasional complete heart block.  Echocardiogram shows normal LV function with apical akinesis.  Troponins are normal.  TSH normal.  1 2-1 AV block/complete heart block-etiology unclear.  He is noted to have right bundle branch block as well.  He is on no AV nodal blocking agents.  History not suggestive of sarcoid or myocarditis.  He does have an apical wall motion abnormality on echocardiogram.  This would not be typical of RCA ischemia.  However he has a strong family history of coronary disease in his father and his sister had unexplained death though attributed to myocardial infarction (found dead in her bed by her son).  I think we should proceed with cardiac catheterization to exclude ischemia mediated complete heart block.  The risk and benefits including myocardial infarction, CVA and death discussed and he agrees to proceed.  We will also consider cardiac MRI to rule out myocarditis or sarcoid.  If no significant ischemia he will need pacemaker.  We will transfer to [facility] for this procedure.   2 hypertension-follow blood pressure and adjust regimen as needed.  Kirk Ruths, MD

## 2022-01-06 NOTE — Consult Note (Signed)
Cardiology Consultation:   Patient ID: Colin Pena MRN: [id number]; DOB: 1955-07-15  Admit date: 01/05/2022 Date of Consult: 01/06/2022  PCP:  Horald Pollen, MD   [facility] Cardiologist:  Kirk Ruths, MD    Patient Profile:   Colin Pena is a 66 y.o. male with a hx of HTN only who is being seen 01/06/2022 for the evaluation of advanced heart block at the request of Dr. Stanford Breed.  History of Present Illness:   Colin Pena sought attention with progressive/episodic fatigue, DOE in the last 10 days or so, he was found in 2:1 AVBlock, new RBBB, AKI, mildly elevated LFTs. He was admitted for further evaluation and on monitor to have CHB as well. BP stable TTE was done with a WMA and planned to come to [facility] for cath and EP evaluation for PPM.  LABS K+ 4.4 > 3.8 MBUN/Creat 18/1.37 > 0.95 HS Trop 8, 9, 10 WBC 6.o H/H 14/41 Plts 188  Ddimer 0.56 TSH 1.540  He reports that about a week and a half ago he was walking up some stairs and suddenly felt SOB and weak, he thought perhaps was the heat outside, feeling better once back in his car in the a/c.  Since then he has been ok without symptoms for a couple days then will get a day that he gets SOB, weak again with minimal exertion.  These episodes have happened a few times now, feeling worse each time, the last few associated with diaphoresis as well. When he got to [facility] walking in from the parking lot he had to flag down a pt assistance van to help him.  AT rest he has no symptoms He has not had any kid of CP or cardiac awareness No syncope, but yesterday in the parking lot he thought he was close to it   Past Medical History:  Diagnosis Date   Hypertension     History reviewed. No pertinent surgical history.   Home Medications:  Prior to Admission medications   Medication Sig Start Date End Date Taking? Authorizing Provider  amLODipine (NORVASC) 10 MG tablet Take 1 tablet (10 mg total) by  mouth daily. 02/16/21  Yes SagardiaInes Bloomer, MD  aspirin 81 MG chewable tablet Chew 81 mg by mouth daily.   Yes [provider]  lisinopril-hydrochlorothiazide (ZESTORETIC) 20-12.5 MG tablet Take 2 tablets by mouth daily. Patient taking differently: Take 1 tablet by mouth 2 (two) times daily. 10/18/21 10/13/22 Yes Sagardia, Ines Bloomer, MD  NONFORMULARY OR COMPOUNDED ITEM Triamcinolone 0.1%/Eucerin. Unknown ratio.1 application topically daily as directed. Patient taking differently: Triamcinolone 0.1%/Eucerin. Unknown ratio.1 application topically daily as needed for eczema 06/14/21  Yes Sagardia, Ines Bloomer, MD    Inpatient Medications: Scheduled Meds:  [MAR Hold] aspirin EC  81 mg Oral Daily   [MAR Hold] heparin  5,000 Units Subcutaneous Q8H   [MAR Hold] sodium chloride flush  3 mL Intravenous Q12H   Continuous Infusions:  sodium chloride 1 mL/kg/hr (01/06/22 1439)   PRN Meds: [MAR Hold] acetaminophen, [MAR Hold] albuterol, [MAR Hold] hydrALAZINE, [MAR Hold] nitroGLYCERIN, [MAR Hold] ondansetron (ZOFRAN) IV  Allergies:   No Known Allergies  Social History:   Social History   Socioeconomic History   Marital status: Married    Spouse name: Not on file   Number of children: Not on file   Years of education: Not on file   Highest education level: Not on file  Occupational History   Not on file  Tobacco  Use   Smoking status: Former    Types: Cigarettes    Quit date: 1978    Years since quitting: 45.5   Smokeless tobacco: Never  Substance and Sexual Activity   Alcohol use: No    Alcohol/week: 0.0 standard drinks of alcohol   Drug use: No   Sexual activity: Not on file  Other Topics Concern   Not on file  Social History Narrative   Lives locally w/ wife.  Retired but working as Building services engineer.  Does not routinely exercise, but stays active.   Social Determinants of Health   Financial Resource Strain: Not on file  Food Insecurity: Not on file   Transportation Needs: Not on file  Physical Activity: Not on file  Stress: Not on file  Social Connections: Not on file  Intimate Partner Violence: Not on file    Family History:   Family History  Problem Relation Age of Onset   Heart failure Mother        died @ 19   Cancer Father    Coronary artery disease Father        MI in his 67's   Hypertension Sister    Cancer Maternal Aunt    Cancer Maternal Uncle    Cancer Maternal Uncle    Heart disease Paternal Uncle    Cancer Paternal Uncle    Cancer Paternal Uncle      ROS:  Please see the history of present illness.  All other ROS reviewed and negative.     Physical Exam/Data:   Vitals:   01/06/22 0830 01/06/22 1153 01/06/22 1310 01/06/22 1435  BP:   (!) 149/66 (!) 157/67  Pulse: 72  (!) 44 (!) 48  Resp: 16  20 18   Temp:  98.6 F (37 C)    TempSrc:      SpO2: 99%  98% 99%  Weight:   81.6 kg   Height:   5\' 9"  (1.753 m)    No intake or output data in the 24 hours ending 01/06/22 1500    01/06/2022    1:10 PM 10/18/2021    9:57 AM 02/16/2021   10:59 AM  Last 3 Weights  Weight (lbs) 180 lb 198 lb 191 lb  Weight (kg) 81.647 kg 89.812 kg 86.637 kg     Body mass index is 26.58 kg/m.  General:  Well nourished, well developed, in no acute distress HEENT: normal Neck: no JVD Vascular: No carotid bruits; Distal pulses 2+ bilaterally Cardiac:  RRR; no murmurs, gallops or rubs Lungs:  CTA b/l, no wheezing, rhonchi or rales  Abd: soft, nontender Ext: no edema Musculoskeletal:  No deformities Skin: warm and dry  Neuro:  no focal abnormalities noted Psych:  Normal affect   EKG:  The EKG was personally reviewed and demonstrates:    2:1 AVBlock, RBBB 48bpm 2:1 AVBlock RBBB, 54bpm  OLD SR 100bpm, , PAC  Telemetry:  Telemetry was personally reviewed and demonstrates:   2:1 AVB lock currently in cath lab holding  Relevant CV Studies:  01/06/22: TTE  1. Patient in complete heart block during study Small area of  apical  akinesis /dyskinesis . Left ventricular ejection fraction, by estimation,  is 55%. The left ventricle has normal function. The left ventricle  demonstrates regional wall motion  abnormalities (see scoring diagram/findings for description). There is  mild left ventricular hypertrophy. Left ventricular diastolic parameters  are indeterminate.   2. Right ventricular systolic function is normal. The right ventricular  size is normal.   3. The mitral valve is normal in structure. No evidence of mitral valve  regurgitation. No evidence of mitral stenosis.   4. The aortic valve is tricuspid. There is mild calcification of the  aortic valve. Aortic valve regurgitation is not visualized. Aortic valve  sclerosis is present, with no evidence of aortic valve stenosis.   5. The inferior vena cava is normal in size with greater than 50%  respiratory variability, suggesting right atrial pressure of 3 mmHg.   Laboratory Data:  High Sensitivity Troponin:   Recent Labs  Lab 01/05/22 1400 01/05/22 1859 01/06/22 0917  TROPONINIHS 8 9 10      Chemistry Recent Labs  Lab 01/05/22 1400 01/06/22 0917  NA 142 139  K 4.4 3.8  CL 111 107  CO2 24 21*  GLUCOSE 122* 114*  BUN 18 16  CREATININE 1.37* 0.95  CALCIUM 8.9 8.8*  GFRNONAA 57* >60  ANIONGAP 7 11    Recent Labs  Lab 01/05/22 1400  PROT 7.1  ALBUMIN 4.0  AST 46*  ALT 48*  ALKPHOS 66  BILITOT 0.5   Lipids No results for input(s): "CHOL", "TRIG", "HDL", "LABVLDL", "LDLCALC", "CHOLHDL" in the last 168 hours.  Hematology Recent Labs  Lab 01/05/22 1400  WBC 6.0  RBC 4.63  HGB 14.0  HCT 41.8  MCV 90.3  MCH 30.2  MCHC 33.5  RDW 13.0  PLT 188   Thyroid  Recent Labs  Lab 01/06/22 0917  TSH 1.540    BNPNo results for input(s): "BNP", "PROBNP" in the last 168 hours.  DDimer  Recent Labs  Lab 01/05/22 2149  DDIMER 0.56*     Radiology/Studies:   DG Chest 2 View Result Date: 01/05/2022 CLINICAL DATA:  Dyspnea  with exertion. EXAM: CHEST - 2 VIEW COMPARISON:  June 28, 2015. FINDINGS: The heart size and mediastinal contours are within normal limits. Left lung is clear. Elevated right hemidiaphragm is noted with minimal right basilar subsegmental atelectasis. The visualized skeletal structures are unremarkable. IMPRESSION: Elevated right hemidiaphragm with minimal right basilar subsegmental atelectasis. Electronically Signed   By: June 30, 2015 M.D.   On: 01/05/2022 13:52     Assessment and Plan:   Advanced AVblock including CHB Suspect this is symptomatic bradycardia Pending cath  In pror d/w Dr.Lambert would like to get a c.MRI prior pacing, if cath is OK,  in this fairly young patient with new conduction system disease  He will see the patient later today, suspect pacing on Monday   Risk Assessment/Risk Scores:     For questions or updates, please contact [facility] Please consult [email or web address] for contact info under    Signed, Saturday, PA-C  01/06/2022 3:00 PM

## 2022-01-06 NOTE — Progress Notes (Signed)
Pt brought to cath lab holding, [place] 6, connected to monitor, denies pain, safety maintained

## 2022-01-06 NOTE — Interval H&P Note (Signed)
History and Physical Interval Note:  01/06/2022 5:17 PM  [name]  has presented today for surgery, with the diagnosis of shortness of breath, heart block, and abnormal echocardiogram.  The various methods of treatment have been discussed with the patient and family. After consideration of risks, benefits and other options for treatment, the patient has consented to  Procedure(s): LEFT HEART CATH AND CORONARY ANGIOGRAPHY (N/A) as a surgical intervention.  The patient's history has been reviewed, patient examined, no change in status, stable for surgery.  I have reviewed the patient's chart and labs.  Questions were answered to the patient's satisfaction.    Cath Lab Visit (complete for each Cath Lab visit)  Clinical Evaluation Leading to the Procedure:   ACS: Yes.    Non-ACS:  N/A  Colin Pena

## 2022-01-07 DIAGNOSIS — R001 Bradycardia, unspecified: Secondary | ICD-10-CM

## 2022-01-07 DIAGNOSIS — I2510 Atherosclerotic heart disease of native coronary artery without angina pectoris: Secondary | ICD-10-CM

## 2022-01-07 LAB — BASIC METABOLIC PANEL
Anion gap: 8 (ref 5–15)
BUN: 16 mg/dL (ref 8–23)
CO2: 21 mmol/L — ABNORMAL LOW (ref 22–32)
Calcium: 8.3 mg/dL — ABNORMAL LOW (ref 8.9–10.3)
Chloride: 104 mmol/L (ref 98–111)
Creatinine, Ser: 1.19 mg/dL (ref 0.61–1.24)
GFR, Estimated: >60 mL/min (ref >60)
Glucose, Bld: 102 mg/dL — ABNORMAL HIGH (ref 70–99)
Potassium: 3.5 mmol/L (ref 3.5–5.1)
Sodium: 133 mmol/L — ABNORMAL LOW (ref 135–145)

## 2022-01-07 LAB — LIPID PANEL
Cholesterol: 139 mg/dL (ref 0–200)
HDL: 25 mg/dL — ABNORMAL LOW (ref >40)
LDL Cholesterol: 85 mg/dL (ref 0–99)
Total CHOL/HDL Ratio: 5.6 RATIO
Triglycerides: 145 mg/dL (ref <150)
VLDL: 29 mg/dL (ref 0–40)

## 2022-01-07 LAB — CBC
HCT: 37.4 % — ABNORMAL LOW (ref 39.0–52.0)
Hemoglobin: 13.2 g/dL (ref 13.0–17.0)
MCH: 30.1 pg (ref 26.0–34.0)
MCHC: 35.3 g/dL (ref 30.0–36.0)
MCV: 85.2 fL (ref 80.0–100.0)
Platelets: 178 10*3/uL (ref 150–400)
RBC: 4.39 MIL/uL (ref 4.22–5.81)
RDW: 12.2 % (ref 11.5–15.5)
WBC: 5.8 10*3/uL (ref 4.0–10.5)
nRBC: 0 % (ref 0.0–0.2)

## 2022-01-07 NOTE — Progress Notes (Signed)
Cardiology Note  Please see attestation from 01/06/2022. Date of that visit was 01/07/2022.  Sheria Lang T. Lalla Brothers, MD, [facility], [facility] Cardiac Electrophysiology

## 2022-01-07 NOTE — Progress Notes (Addendum)
1 Day Post-Op Procedure(s) (LRB): LEFT HEART CATH AND CORONARY ANGIOGRAPHY (N/A) Subjective: Asked by Dr. Okey Dupre to review coronary arteriograms for coordination of care to assess for possible CABG versus PCI.  I previously reviewed the coronary angiogram and echocardiogram images. The patient has symptomatic heart block and will probably need a permanent pacemaker. The LAD is chronically occluded and not a graftable vessel.  Patient has significant distal left main disease and distal RCA disease which are graftable,, effectively two-vessel CAD with preserved LV function.. If the non LAD disease is treatable with PCI without increased risk then either approach to revascularization would be reasonable with combined pacemaker placement. Vital signs in last 24 hours: Temp:  [97.5 F (36.4 C)-98.6 F (37 C)] 97.8 F (36.6 C) (07/22 0450) Pulse Rate:  [40-94] 74 (07/22 0450) Cardiac Rhythm: Heart block (07/22 0721) Resp:  [12-25] 19 (07/22 0450) BP: (127-172)/(59-85) 127/85 (07/22 0450) SpO2:  [93 %-100 %] 96 % (07/22 0450) Weight:  [81.6 kg-87 kg] 87 kg (07/22 0450)  Hemodynamic parameters for last 24 hours: Stable  Intake/Output from previous day: 07/21 0701 - 07/22 0700 In: 92.5 [I.V.:92.5] Out: -  Intake/Output this shift: No intake/output data recorded.    Lab Results: Recent Labs    01/05/22 1400 01/07/22 0215  WBC 6.0 5.8  HGB 14.0 13.2  HCT 41.8 37.4*  PLT 188 178   BMET:  Recent Labs    01/06/22 0917 01/07/22 0215  NA 139 133*  K 3.8 3.5  CL 107 104  CO2 21* 21*  GLUCOSE 114* 102*  BUN 16 16  CREATININE 0.95 1.19  CALCIUM 8.8* 8.3*    PT/INR: No results for input(s): "LABPROT", "INR" in the last 72 hours. ABG No results found for: "PHART", "HCO3", "TCO2", "ACIDBASEDEF", "O2SAT" CBG (last 3)  No results for input(s): "GLUCAP" in the last 72 hours.  Assessment/Plan: S/P Procedure(s) (LRB): LEFT HEART CATH AND CORONARY ANGIOGRAPHY (N/A) will be available  to discuss CABG with patient if that is patient's preference   LOS: 2 days    Colin Pena 01/07/2022  Addendum:  I visited with the patient and discussed potential surgical treatment of his coronary artery disease.  The patient understands that surgical revascularization of the chronically occluded LAD would not be possible.  It is his strong preference to have PCI treatment of his other coronary disease.  He also is adverse to undergoing MRI which was discussed by Dr. Okey Dupre to assess viability.  Colin Sox MD

## 2022-01-08 ENCOUNTER — Other Ambulatory Visit: Payer: Self-pay

## 2022-01-08 LAB — SURGICAL PCR SCREEN
MRSA, PCR: NEGATIVE
Staphylococcus aureus: POSITIVE — AB

## 2022-01-08 MED ORDER — SODIUM CHLORIDE 0.9 % IV SOLN: INTRAVENOUS | Status: DC

## 2022-01-08 MED ORDER — CHLORHEXIDINE GLUCONATE 4 % EX LIQD
60.0000 mL | Freq: Once | CUTANEOUS | Status: AC
  Administered 2022-01-09: 4 via TOPICAL
  Filled 2022-01-08: qty 60

## 2022-01-08 MED ORDER — POLYETHYLENE GLYCOL 3350 17 G PO PACK
17.0000 g | PACK | Freq: Every day | ORAL | Status: DC | PRN
  Administered 2022-01-08: 17 g via ORAL
  Filled 2022-01-08: qty 1

## 2022-01-08 MED ORDER — CHLORHEXIDINE GLUCONATE 4 % EX LIQD
60.0000 mL | Freq: Once | CUTANEOUS | Status: AC
  Administered 2022-01-08: 4 via TOPICAL
  Filled 2022-01-08: qty 60

## 2022-01-08 MED ORDER — MUPIROCIN 2 % EX OINT
1.0000 | TOPICAL_OINTMENT | Freq: Two times a day (BID) | CUTANEOUS | Status: DC
  Administered 2022-01-09 – 2022-01-11 (×6): 1 via NASAL
  Filled 2022-01-08: qty 22

## 2022-01-08 MED ORDER — CHLORHEXIDINE GLUCONATE CLOTH 2 % EX PADS
6.0000 | MEDICATED_PAD | Freq: Every day | CUTANEOUS | Status: DC
  Administered 2022-01-09 – 2022-01-11 (×3): 6 via TOPICAL

## 2022-01-08 MED ORDER — DOCUSATE SODIUM 100 MG PO CAPS: 100.0000 mg | ORAL_CAPSULE | Freq: Every day | ORAL | Status: DC | PRN

## 2022-01-08 NOTE — Progress Notes (Signed)
CT surgery note  I agree with plans to proceed with permanent pacemaker placement followed by PCI.  We will be available to assist with surgical evaluation if needed later.  Kerin Perna MD

## 2022-01-08 NOTE — Progress Notes (Signed)
EP Progress Note  Patient Name: Colin Pena Date of Encounter: 01/08/2022  [facility] Cardiologist: Kirk Ruths, MD   Subjective   NAEO CTS consult yesterday  Inpatient Medications    Scheduled Meds:  aspirin EC  81 mg Oral Daily   enoxaparin (LOVENOX) injection  40 mg Subcutaneous Daily   sodium chloride flush  3 mL Intravenous Q12H   sodium chloride flush  3 mL Intravenous Q12H   Continuous Infusions:  sodium chloride     sodium chloride     PRN Meds: sodium chloride, sodium chloride, acetaminophen, albuterol, hydrALAZINE, nitroGLYCERIN, ondansetron (ZOFRAN) IV, sodium chloride flush, sodium chloride flush   Vital Signs    Vitals:   01/07/22 0450 01/07/22 2100 01/08/22 0621 01/08/22 0756  BP: 127/85 (!) 127/59 (!) 152/84 (!) 153/94  Pulse: 74 (!) 45 60 65  Resp: 19 18 18 18   Temp: 97.8 F (36.6 C) 98 F (36.7 C) 98.1 F (36.7 C) 97.9 F (36.6 C)  TempSrc: Oral Oral Oral Oral  SpO2: 96% 97% 98% 99%  Weight: 87 kg  86.8 kg   Height:        Intake/Output Summary (Last 24 hours) at 01/08/2022 0828 Last data filed at 01/08/2022 0800 Gross per 24 hour  Intake 360 ml  Output --  Net 360 ml      01/08/2022    6:21 AM 01/07/2022    4:50 AM 01/06/2022    1:10 PM  Last 3 Weights  Weight (lbs) 191 lb 4.8 oz 191 lb 12.8 oz 180 lb  Weight (kg) 86.773 kg 87 kg 81.647 kg      Telemetry    2:1, CHB, sinus rhythm Personally Reviewed  ECG    Personally Reviewed  Physical Exam   GEN: No acute distress.   Neck: No JVD Cardiac: regular rhythm, bradycardic, no murmurs, rubs, or gallops.  Respiratory: Clear to auscultation bilaterally. GI: Soft, nontender, non-distended  MS: No edema; No deformity. Neuro:  Nonfocal  Psych: Normal affect   Labs    High Sensitivity Troponin:   Recent Labs  Lab 01/05/22 1400 01/05/22 1859 01/06/22 0917  TROPONINIHS 8 9 10      Chemistry Recent Labs  Lab 01/05/22 1400 01/06/22 0917 01/07/22 0215  NA  142 139 133*  K 4.4 3.8 3.5  CL 111 107 104  CO2 24 21* 21*  GLUCOSE 122* 114* 102*  BUN 18 16 16   CREATININE 1.37* 0.95 1.19  CALCIUM 8.9 8.8* 8.3*  PROT 7.1  --   --   ALBUMIN 4.0  --   --   AST 46*  --   --   ALT 48*  --   --   ALKPHOS 66  --   --   BILITOT 0.5  --   --   GFRNONAA 57* >60 >60  ANIONGAP 7 11 8     Lipids  Recent Labs  Lab 01/07/22 0215  CHOL 139  TRIG 145  HDL 25*  LDLCALC 85  CHOLHDL 5.6    Hematology Recent Labs  Lab 01/05/22 1400 01/07/22 0215  WBC 6.0 5.8  RBC 4.63 4.39  HGB 14.0 13.2  HCT 41.8 37.4*  MCV 90.3 85.2  MCH 30.2 30.1  MCHC 33.5 35.3  RDW 13.0 12.2  PLT 188 178   Thyroid  Recent Labs  Lab 01/06/22 0917  TSH 1.540    BNPNo results for input(s): "BNP", "PROBNP" in the last 168 hours.  DDimer  Recent Labs  Lab 01/05/22  2149  DDIMER 0.56*     Radiology    CARDIAC CATHETERIZATION  Result Date: 01/06/2022 Conclusions: Severe three-vessel coronary artery disease, as detailed below. Mildly elevated left ventricular filling pressure. Recommendations: Cardiac MRI for myocardial viability with attention on LAD territory. Cardiac surgery consultation and Heart Team evaluation to discuss optimal revascularization strategy. Follow-up EP recommendations. Aggressive secondary prevention of coronary artery disease. [name], MD [facility]  ECHOCARDIOGRAM COMPLETE  Result Date: 01/06/2022    ECHOCARDIOGRAM REPORT   Patient Name:   Colin Pena Date of Exam: 01/06/2022 Medical Rec #:  [id number]          Height:       68.0 in Accession #:    [id number]         Weight:       198.0 lb Date of Birth:  1955/12/08          BSA:          2.035 m Patient Age:    66 years           BP:           156/85 mmHg Patient Gender: M                  HR:           47 bpm. Exam Location:  Inpatient Procedure: 2D Echo, Cardiac Doppler and Color Doppler STAT ECHO Indications:    Heart block  History:        Patient has no prior history of  Echocardiogram examinations.                 Risk Factors:Hypertension.  Sonographer:    Cleatis Polka Referring Phys: [phone] CHRISTOPHER RONALD BERGE IMPRESSIONS  1. Patient in complete heart block during study Small area of apical akinesis /dyskinesis . Left ventricular ejection fraction, by estimation, is 55%. The left ventricle has normal function. The left ventricle demonstrates regional wall motion abnormalities (see scoring diagram/findings for description). There is mild left ventricular hypertrophy. Left ventricular diastolic parameters are indeterminate.  2. Right ventricular systolic function is normal. The right ventricular size is normal.  3. The mitral valve is normal in structure. No evidence of mitral valve regurgitation. No evidence of mitral stenosis.  4. The aortic valve is tricuspid. There is mild calcification of the aortic valve. Aortic valve regurgitation is not visualized. Aortic valve sclerosis is present, with no evidence of aortic valve stenosis.  5. The inferior vena cava is normal in size with greater than 50% respiratory variability, suggesting right atrial pressure of 3 mmHg. FINDINGS  Left Ventricle: Patient in complete heart block during study Small area of apical akinesis /dyskinesis. Left ventricular ejection fraction, by estimation, is 55%. The left ventricle has normal function. The left ventricle demonstrates regional wall motion abnormalities. The left ventricular internal cavity size was normal in size. There is mild left ventricular hypertrophy. Left ventricular diastolic parameters are indeterminate. Right Ventricle: The right ventricular size is normal. No increase in right ventricular wall thickness. Right ventricular systolic function is normal. Left Atrium: Left atrial size was normal in size. Right Atrium: Right atrial size was normal in size. Pericardium: There is no evidence of pericardial effusion. Mitral Valve: The mitral valve is normal in structure. No evidence of  mitral valve regurgitation. No evidence of mitral valve stenosis. Tricuspid Valve: The tricuspid valve is normal in structure. Tricuspid valve regurgitation is not demonstrated. No evidence of tricuspid stenosis. Aortic Valve: The aortic  valve is tricuspid. There is mild calcification of the aortic valve. Aortic valve regurgitation is not visualized. Aortic valve sclerosis is present, with no evidence of aortic valve stenosis. Aortic valve peak gradient measures 7.0 mmHg. Pulmonic Valve: The pulmonic valve was normal in structure. Pulmonic valve regurgitation is not visualized. No evidence of pulmonic stenosis. Aorta: The aortic root is normal in size and structure. Venous: The inferior vena cava is normal in size with greater than 50% respiratory variability, suggesting right atrial pressure of 3 mmHg. IAS/Shunts: No atrial level shunt detected by color flow Doppler.  LEFT VENTRICLE PLAX 2D LVIDd:         4.50 cm      Diastology LVIDs:         2.80 cm      LV e' medial:    7.29 cm/s LV PW:         1.10 cm      LV E/e' medial:  11.8 LV IVS:        1.20 cm      LV e' lateral:   8.92 cm/s LVOT diam:     2.00 cm      LV E/e' lateral: 9.6 LV SV:         77 LV SV Index:   38 LVOT Area:     3.14 cm  LV Volumes (MOD) LV vol d, MOD A2C: 113.0 ml LV vol d, MOD A4C: 95.5 ml LV vol s, MOD A2C: 47.4 ml LV vol s, MOD A4C: 40.5 ml LV SV MOD A2C:     65.6 ml LV SV MOD A4C:     95.5 ml LV SV MOD BP:      59.9 ml RIGHT VENTRICLE             IVC RV Basal diam:  2.90 cm     IVC diam: 1.50 cm RV Mid diam:    2.60 cm RV S prime:     13.40 cm/s TAPSE (M-mode): 2.4 cm LEFT ATRIUM             Index        RIGHT ATRIUM           Index LA diam:        3.50 cm 1.72 cm/m   RA Area:     14.50 cm LA Vol (A2C):   33.4 ml 16.41 ml/m  RA Volume:   35.00 ml  17.20 ml/m LA Vol (A4C):   30.3 ml 14.89 ml/m LA Biplane Vol: 31.5 ml 15.48 ml/m  AORTIC VALVE AV Area (Vmax): 2.97 cm AV Vmax:        132.00 cm/s AV Peak Grad:   7.0 mmHg LVOT Vmax:       125.00 cm/s LVOT Vmean:     79.000 cm/s LVOT VTI:       0.246 m  AORTA Ao Root diam: 3.00 cm Ao Asc diam:  2.80 cm MITRAL VALVE               TRICUSPID VALVE MV Area (PHT): 4.19 cm    TR Peak grad:   25.8 mmHg MV Decel Time: 181 msec    TR Vmax:        254.00 cm/s MV E velocity: 85.70 cm/s MV A velocity: 69.80 cm/s  SHUNTS MV E/A ratio:  1.23        Systemic VTI:  0.25 m  Systemic Diam: 2.00 cm Charlton Haws MD Electronically signed by Charlton Haws MD Signature Date/Time: 01/06/2022/9:40:40 AM    Final       Assessment & Plan    Mr Baldyga is a pleasant 66yo man with hx fo HTN who presented to OSH with new onset fatigue, presyncope and shortness of breath. He tells me he has been experiencing right neck pain. Strong family history of CAD.  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 Symptomatic. Will require permanent pacemaker I would like a cardiac MRI before pacemaker implant. Plan for tomorrow morning. Tentatively planning for PPM implant tomorrow afternoon.  Keep npo after MN.  Will need to discuss timing of PPM and PCI with interventionalist tomorrow. Ideally would hold on PCI for 2-3 weeks after pacemaker to minimize risk of pocket hematoma/infection with heparin/DAPT.  For questions or updates, please contact [facility] Please consult [email or web address] for contact info under        Signed, Lanier Prude, MD  01/08/2022, 8:28 AM

## 2022-01-08 NOTE — Plan of Care (Signed)

## 2022-01-08 NOTE — Progress Notes (Signed)
Per Dr. Lovena Neighbours request, pre-ppm orders written, patient added to add-on board with notation of cMRI pending prior to pacemaker. cMRI order placed for Monday and msg sent to imaging RN team to help coordinate.

## 2022-01-09 ENCOUNTER — Encounter (HOSPITAL_COMMUNITY): Payer: Self-pay | Admitting: Internal Medicine

## 2022-01-09 ENCOUNTER — Inpatient Hospital Stay (HOSPITAL_COMMUNITY): Payer: Medicare Other

## 2022-01-09 DIAGNOSIS — I441 Atrioventricular block, second degree: Secondary | ICD-10-CM

## 2022-01-09 LAB — BASIC METABOLIC PANEL
Anion gap: 9 (ref 5–15)
BUN: 16 mg/dL (ref 8–23)
CO2: 25 mmol/L (ref 22–32)
Calcium: 8.6 mg/dL — ABNORMAL LOW (ref 8.9–10.3)
Chloride: 105 mmol/L (ref 98–111)
Creatinine, Ser: 1.27 mg/dL — ABNORMAL HIGH (ref 0.61–1.24)
GFR, Estimated: >60 mL/min (ref >60)
Glucose, Bld: 103 mg/dL — ABNORMAL HIGH (ref 70–99)
Potassium: 3.9 mmol/L (ref 3.5–5.1)
Sodium: 139 mmol/L (ref 135–145)

## 2022-01-09 LAB — LIPOPROTEIN A (LPA): Lipoprotein (a): 19.5 nmol/L (ref <75.0)

## 2022-01-09 MED ORDER — CEFAZOLIN SODIUM-DEXTROSE 2-4 GM/100ML-% IV SOLN: 2.0000 g | INTRAVENOUS | Status: DC

## 2022-01-09 MED ORDER — CHLORHEXIDINE GLUCONATE 4 % EX LIQD: 60.0000 mL | Freq: Once | CUTANEOUS | Status: DC

## 2022-01-09 MED ORDER — LORAZEPAM 0.5 MG PO TABS
0.5000 mg | ORAL_TABLET | Freq: Once | ORAL | Status: AC | PRN
  Administered 2022-01-09: 0.5 mg via ORAL
  Filled 2022-01-09: qty 1

## 2022-01-09 MED ORDER — SODIUM CHLORIDE 0.9 % IV SOLN
80.0000 mg | INTRAVENOUS | Status: DC
  Filled 2022-01-09: qty 2

## 2022-01-09 MED ORDER — SODIUM CHLORIDE 0.9 % IV SOLN: INTRAVENOUS | Status: DC

## 2022-01-09 MED ORDER — CHLORHEXIDINE GLUCONATE 4 % EX LIQD
60.0000 mL | Freq: Once | CUTANEOUS | Status: AC
  Administered 2022-01-09: 4 via TOPICAL

## 2022-01-09 MED ORDER — SODIUM CHLORIDE 0.9 % IV SOLN: 80.0000 mg | INTRAVENOUS | Status: DC

## 2022-01-09 MED ORDER — GADOBUTROL 1 MMOL/ML IV SOLN
10.0000 mL | Freq: Once | INTRAVENOUS | Status: AC | PRN
  Administered 2022-01-09: 10 mL via INTRAVENOUS

## 2022-01-09 NOTE — Progress Notes (Signed)
Electrophysiology brief update  Due to lab scheduling, the patient's procedure is being postponed until tomorrow.  Please keep n.p.o. after midnight.  He is okay to eat dinner this evening.  I discussed the plan with the patient who is in agreement.  Sheria Lang T. Lalla Brothers, MD, [facility], [facility] Cardiac Electrophysiology

## 2022-01-09 NOTE — Progress Notes (Addendum)
Electrophysiology Rounding Note  Patient Name: Colin Pena Date of Encounter: 01/09/2022  Primary Cardiologist: Olga Millers, MD Electrophysiologist: None   Subjective  NAEO  Anxious to get moving with the plan. Already signed consent for pacer  Inpatient Medications    Scheduled Meds:  aspirin EC  81 mg Oral Daily   Chlorhexidine Gluconate Cloth  6 each Topical Q0600   gentamicin (GARAMYCIN) 80 mg in sodium chloride 0.9 % 500 mL irrigation  80 mg Irrigation On Call   mupirocin ointment  1 Application Nasal BID   sodium chloride flush  3 mL Intravenous Q12H   sodium chloride flush  3 mL Intravenous Q12H   Continuous Infusions:  sodium chloride     sodium chloride     sodium chloride 50 mL/hr at 01/09/22 0619    ceFAZolin (ANCEF) IV     PRN Meds: sodium chloride, sodium chloride, acetaminophen, albuterol, docusate sodium, hydrALAZINE, nitroGLYCERIN, ondansetron (ZOFRAN) IV, polyethylene glycol, sodium chloride flush, sodium chloride flush   Vital Signs    Vitals:   01/08/22 0756 01/08/22 1345 01/08/22 2035 01/09/22 0528  BP: (!) 153/94 (!) 168/86 (!) 159/74 130/84  Pulse: 65 60 (!) 47 63  Resp: 18 18 18    Temp: 97.9 F (36.6 C) (!) 97.3 F (36.3 C) 97.9 F (36.6 C) 97.9 F (36.6 C)  TempSrc: Oral Oral Oral Oral  SpO2: 99% 99% 97% 96%  Weight:    86.3 kg  Height:        Intake/Output Summary (Last 24 hours) at 01/09/2022 01/11/2022 Last data filed at 01/08/2022 0800 Gross per 24 hour  Intake 360 ml  Output --  Net 360 ml   Filed Weights   01/07/22 0450 01/08/22 0621 01/09/22 0528  Weight: 87 kg 86.8 kg 86.3 kg    Physical Exam    GEN- The patient is well appearing, alert and oriented x 3 today.   Head- normocephalic, atraumatic Eyes-  Sclera clear, conjunctiva pink Ears- hearing intact Oropharynx- clear Neck- supple Lungs- Clear to ausculation bilaterally, normal work of breathing Heart- Regular rate and rhythm, no murmurs, rubs or  gallops GI- soft, NT, ND, + BS Extremities- no clubbing or cyanosis. No edema Skin- no rash or lesion Psych- euthymic mood, full affect Neuro- strength and sensation are intact  Labs    CBC Recent Labs    01/07/22 0215  WBC 5.8  HGB 13.2  HCT 37.4*  MCV 85.2  PLT 178   Basic Metabolic Panel Recent Labs    01/09/22 0215 01/09/22 0425  NA 133* 139  K 3.5 3.9  CL 104 105  CO2 21* 25  GLUCOSE 102* 103*  BUN 16 16  CREATININE 1.19 1.27*  CALCIUM 8.3* 8.6*   Liver Function Tests No results for input(s): "AST", "ALT", "ALKPHOS", "BILITOT", "PROT", "ALBUMIN" in the last 72 hours. No results for input(s): "LIPASE", "AMYLASE" in the last 72 hours. Cardiac Enzymes No results for input(s): "CKTOTAL", "CKMB", "CKMBINDEX", "TROPONINI" in the last 72 hours.   Telemetry    Intermittent 2:1 AV block with rates in 40s, intermittently in sinus 1:1 in 50-60s (personally reviewed)  Radiology    No results found.  Patient Profile     Colin Pena is a pleasant 66yo man with hx fo HTN who presented to OSH with new onset fatigue, presyncope and shortness of breath. He tells me he has been experiencing right neck pain. 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 Symptomatic. Will require permanent pacemaker, tentatively plan today post cMRI.  Explained risks, benefits, and alternatives to PPM implantation, including but not limited to bleeding, infection, pneumothorax, pericardial effusion, lead dislodgement, heart attack, stroke, or death.  Pt verbalized understanding and agrees to proceed  Ideally, would hold on PCI for 2-3 weeks to minimize risk of pocket hematoma/infection with heparin/DAPT.   For questions or updates, please contact [facility] Please consult [email or web address] for contact info under Cardiology/STEMI.  Signed, Graciella Freer, PA-C  01/09/2022, 7:02 AM

## 2022-01-09 NOTE — Plan of Care (Signed)

## 2022-01-09 NOTE — Care Management (Addendum)
  Transition of Care [facility]) Screening Note   Patient Details  Name: Colin Pena Date of Birth: Dec 19, 1955   Transition of Care [facility]) CM/SW Contact:    Gala Lewandowsky, RN Phone Number: 01/09/2022, 4:52 PM    Transition of Care Department [facility]) has reviewed the patient and no TOC needs have been identified at this time. Plan for pacemaker 01-10-22. Case Manager will follow for disposition needs as the patient progresses.

## 2022-01-09 NOTE — Progress Notes (Signed)
Patient had 2 minutes of intermittent complete heart block with ventricular escape rhythm. Patient asymptomatic. Dr. Lalla Brothers paged and came by to see patient, he observed the cardiac strips and spoke to the patient about changing his procedure til tomorrow. Will call oncall provider if patient has increased chb with ventricular escape rhythm.

## 2022-01-10 ENCOUNTER — Encounter (HOSPITAL_COMMUNITY)
Admission: EM | Disposition: A | Discharge status: Home / Self Care | Payer: Self-pay | Source: Home / Self Care | Attending: Cardiology

## 2022-01-10 ENCOUNTER — Encounter (HOSPITAL_COMMUNITY): Payer: Self-pay | Admitting: Cardiology

## 2022-01-10 DIAGNOSIS — I442 Atrioventricular block, complete: Secondary | ICD-10-CM

## 2022-01-10 DIAGNOSIS — R0609 Other forms of dyspnea: Secondary | ICD-10-CM

## 2022-01-10 DIAGNOSIS — I2510 Atherosclerotic heart disease of native coronary artery without angina pectoris: Secondary | ICD-10-CM

## 2022-01-10 DIAGNOSIS — E7800 Pure hypercholesterolemia, unspecified: Secondary | ICD-10-CM

## 2022-01-10 HISTORY — PX: PACEMAKER IMPLANT: EP1218

## 2022-01-10 SURGERY — PACEMAKER IMPLANT
Anesthesia: LOCAL

## 2022-01-10 MED ORDER — FENTANYL CITRATE (PF) 100 MCG/2ML IJ SOLN
INTRAMUSCULAR | Status: AC
  Filled 2022-01-10: qty 2

## 2022-01-10 MED ORDER — SODIUM CHLORIDE 0.9 % IV SOLN: INTRAVENOUS | Status: DC

## 2022-01-10 MED ORDER — HEPARIN (PORCINE) IN NACL 1000-0.9 UT/500ML-% IV SOLN
INTRAVENOUS | Status: AC
  Filled 2022-01-10: qty 500

## 2022-01-10 MED ORDER — LIDOCAINE HCL (PF) 1 % IJ SOLN
INTRAMUSCULAR | Status: DC | PRN
  Administered 2022-01-10: 60 mL

## 2022-01-10 MED ORDER — ROSUVASTATIN CALCIUM 5 MG PO TABS
10.0000 mg | ORAL_TABLET | Freq: Every day | ORAL | Status: DC
  Administered 2022-01-10 – 2022-01-11 (×2): 10 mg via ORAL
  Filled 2022-01-10 (×2): qty 2

## 2022-01-10 MED ORDER — SODIUM CHLORIDE 0.9 % IV SOLN
INTRAVENOUS | Status: AC
  Filled 2022-01-10: qty 2

## 2022-01-10 MED ORDER — LIDOCAINE HCL (PF) 1 % IJ SOLN
INTRAMUSCULAR | Status: AC
  Filled 2022-01-10: qty 60

## 2022-01-10 MED ORDER — SODIUM CHLORIDE 0.9 % IV SOLN
INTRAVENOUS | Status: DC
Start: 2022-01-10 — End: 2022-01-10

## 2022-01-10 MED ORDER — HEPARIN (PORCINE) IN NACL 1000-0.9 UT/500ML-% IV SOLN
INTRAVENOUS | Status: DC | PRN
  Administered 2022-01-10: 500 mL

## 2022-01-10 MED ORDER — MIDAZOLAM HCL 5 MG/5ML IJ SOLN
INTRAMUSCULAR | Status: DC | PRN
  Administered 2022-01-10: 1 mg via INTRAVENOUS

## 2022-01-10 MED ORDER — SODIUM CHLORIDE 0.9 % IV SOLN
80.0000 mg | INTRAVENOUS | Status: AC
  Administered 2022-01-10: 80 mg
  Filled 2022-01-10: qty 2

## 2022-01-10 MED ORDER — MIDAZOLAM HCL 5 MG/5ML IJ SOLN
INTRAMUSCULAR | Status: AC
  Filled 2022-01-10: qty 5

## 2022-01-10 MED ORDER — CEFAZOLIN SODIUM-DEXTROSE 2-4 GM/100ML-% IV SOLN
INTRAVENOUS | Status: AC
  Filled 2022-01-10: qty 100

## 2022-01-10 MED ORDER — FENTANYL CITRATE (PF) 100 MCG/2ML IJ SOLN
INTRAMUSCULAR | Status: DC | PRN
  Administered 2022-01-10: 25 ug via INTRAVENOUS

## 2022-01-10 MED ORDER — CEFAZOLIN SODIUM-DEXTROSE 2-4 GM/100ML-% IV SOLN
2.0000 g | INTRAVENOUS | Status: AC
  Administered 2022-01-10: 2 g via INTRAVENOUS

## 2022-01-10 SURGICAL SUPPLY — 14 items
CABLE SURGICAL S-101-97-12 (CABLE) ×2 IMPLANT
CATH CPS LOCATOR 3D SM (CATHETERS) ×1 IMPLANT
HELIX LOCKING TOOL (MISCELLANEOUS) ×2
LEAD TENDRIL MRI 52CM LPA1200M (Lead) ×1 IMPLANT
LEAD TENDRIL SDX 2088TC-58CM (Lead) ×1 IMPLANT
PACEMAKER ASSURITY DR-RF (Pacemaker) ×1 IMPLANT
PAD DEFIB RADIO PHYSIO CONN (PAD) ×2 IMPLANT
SHEATH 8FR PRELUDE SNAP 13 (SHEATH) ×2 IMPLANT
SHEATH 9FR PRELUDE SNAP 13 (SHEATH) ×1 IMPLANT
SHEATH PROBE COVER 6X72 (BAG) ×1 IMPLANT
SLITTER AGILIS HISPRO (INSTRUMENTS) ×1 IMPLANT
TOOL HELIX LOCKING (MISCELLANEOUS) IMPLANT
TRAY PACEMAKER INSERTION (PACKS) ×2 IMPLANT
WIRE HI TORQ VERSACORE-J 145CM (WIRE) ×1 IMPLANT

## 2022-01-10 NOTE — Progress Notes (Signed)
Progress Note  Patient Name: Colin Pena Date of Encounter: 01/10/2022  [facility] Cardiologist: Olga Millers, MD   Subjective   Feeling tired.  Eager to get PPM.  Denies chest pain now or in the past.  He does have exertional dyspnea.  Lightheaded and pre-syncope when he had a 4.4 second pause  Inpatient Medications    Scheduled Meds:  aspirin EC  81 mg Oral Daily   Chlorhexidine Gluconate Cloth  6 each Topical Q0600   gentamicin (GARAMYCIN) 80 mg in sodium chloride 0.9 % 500 mL irrigation  80 mg Irrigation On Call   mupirocin ointment  1 Application Nasal BID   sodium chloride flush  3 mL Intravenous Q12H   sodium chloride flush  3 mL Intravenous Q12H   Continuous Infusions:  sodium chloride     sodium chloride     sodium chloride     sodium chloride      ceFAZolin (ANCEF) IV     PRN Meds: sodium chloride, sodium chloride, acetaminophen, albuterol, docusate sodium, hydrALAZINE, nitroGLYCERIN, ondansetron (ZOFRAN) IV, polyethylene glycol, sodium chloride flush, sodium chloride flush   Vital Signs    Vitals:   01/09/22 2000 01/10/22 0000 01/10/22 0400 01/10/22 0514  BP: 132/65 130/62  131/70  Pulse: (!) 47 66  62  Resp: 16 16    Temp: 97.8 F (36.6 C)  97.7 F (36.5 C) (!) 97.4 F (36.3 C)  TempSrc: Oral   Oral  SpO2: 97% 97%    Weight:    85.4 kg  Height:        Intake/Output Summary (Last 24 hours) at 01/10/2022 0835 Last data filed at 01/09/2022 1650 Gross per 24 hour  Intake 383.34 ml  Output --  Net 383.34 ml      01/10/2022    5:14 AM 01/09/2022    5:28 AM 01/08/2022    6:21 AM  Last 3 Weights  Weight (lbs) 188 lb 4.8 oz 190 lb 3.2 oz 191 lb 4.8 oz  Weight (kg) 85.412 kg 86.274 kg 86.773 kg      Telemetry    Complete heart block.  Mostly 2:1 high degree AV block.  He had a 4.4 second ventricular pause.  - Personally Reviewed  ECG    2:1 high degree AV block.  Ventricular rate 41 bpm.  RBBB. - Personally Reviewed  Physical Exam    VS:  BP 127/68 (BP Location: Left Arm)   Pulse (!) 43   Temp 97.7 F (36.5 C) (Oral)   Resp 18   Ht 5\' 9"  (1.753 m)   Wt 85.4 kg   SpO2 97%   BMI 27.81 kg/m  , BMI Body mass index is 27.81 kg/m. GENERAL:  Well appearing HEENT: Pupils equal round and reactive, fundi not visualized, oral mucosa unremarkable NECK:  No jugular venous distention, waveform within normal limits, carotid upstroke brisk and symmetric, no bruits, no thyromegaly LUNGS:  Clear to auscultation bilaterally HEART:  Bradycardic.  Regular rhythm.  PMI not displaced or sustained,S1 and S2 within normal limits, no S3, no S4, no clicks, no rubs, no murmurs ABD:  Flat, positive bowel sounds normal in frequency in pitch, no bruits, no rebound, no guarding, no midline pulsatile mass, no hepatomegaly, no splenomegaly EXT:  2 plus pulses throughout, no edema, no cyanosis no clubbing SKIN:  No rashes no nodules NEURO:  Cranial nerves II through XII grossly intact, motor grossly intact throughout PSYCH:  Cognitively intact, oriented to person place and time  Labs    High Sensitivity Troponin:   Recent Labs  Lab 01/05/22 1400 01/05/22 1859 01/06/22 0917  TROPONINIHS 8 9 10      Chemistry Recent Labs  Lab 01/05/22 1400 01/06/22 0917 01/07/22 0215 01/09/22 0425  NA 142 139 133* 139  K 4.4 3.8 3.5 3.9  CL 111 107 104 105  CO2 24 21* 21* 25  GLUCOSE 122* 114* 102* 103*  BUN 18 16 16 16   CREATININE 1.37* 0.95 1.19 1.27*  CALCIUM 8.9 8.8* 8.3* 8.6*  PROT 7.1  --   --   --   ALBUMIN 4.0  --   --   --   AST 46*  --   --   --   ALT 48*  --   --   --   ALKPHOS 66  --   --   --   BILITOT 0.5  --   --   --   GFRNONAA 57* >60 >60 >60  ANIONGAP 7 11 8 9     Lipids  Recent Labs  Lab 01/07/22 0215  CHOL 139  TRIG 145  HDL 25*  LDLCALC 85  CHOLHDL 5.6    Hematology Recent Labs  Lab 01/05/22 1400 01/07/22 0215  WBC 6.0 5.8  RBC 4.63 4.39  HGB 14.0 13.2  HCT 41.8 37.4*  MCV 90.3 85.2  MCH 30.2 30.1   MCHC 33.5 35.3  RDW 13.0 12.2  PLT 188 178   Thyroid  Recent Labs  Lab 01/06/22 0917  TSH 1.540    BNPNo results for input(s): "BNP", "PROBNP" in the last 168 hours.  DDimer  Recent Labs  Lab 01/05/22 2149  DDIMER 0.56*     Radiology      Cardiac Studies   Cardiac MRI 01/09/22: IMPRESSION: 1. 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 LGE is less than 50% transmural in mid anterior wall, suggesting viability. LGE is greater than 50% transmural in apical anterior wall and apex, suggesting nonviability in distal LAD territory.   2. Normal LV size, mild hypertrophy, and normal global systolic function (EF 0000000). Apical anterior akinesis. Small apical aneurysm.   3.  Normal RV size and systolic function (EF [id number])   LHC 01/06/22: Conclusions: Severe three-vessel coronary artery disease, as detailed below. Mildly elevated left ventricular filling pressure.   Recommendations: Cardiac MRI for myocardial viability with attention on LAD territory. Cardiac surgery consultation and Heart Team evaluation to discuss optimal revascularization strategy. Follow-up EP recommendations. Aggressive secondary prevention of coronary artery disease.   Echo 01/06/22: IMPRESSIONS    1. Patient in complete heart block during study Small area of apical  akinesis /dyskinesis . Left ventricular ejection fraction, by estimation,  is 55%. The left ventricle has normal function. The left ventricle  demonstrates regional wall motion  abnormalities (see scoring diagram/findings for description). There is  mild left ventricular hypertrophy. Left ventricular diastolic parameters  are indeterminate.   2. Right ventricular systolic function is normal. The right ventricular  size is normal.   3. The mitral valve is normal in structure. No evidence of mitral valve  regurgitation. No evidence of mitral stenosis.   4. The aortic  valve is tricuspid. There is mild calcification of the  aortic valve. Aortic valve regurgitation is not visualized. Aortic valve  sclerosis is present, with no evidence of aortic valve stenosis.   5. The inferior vena cava is normal in size with greater than 50%  respiratory variability,  suggesting right atrial pressure of 3 mmHg.   Patient Profile     66 y.o. male with hypertension who presented with fatigue, pre-syncope and SOB and was found to have intermittent CHB.    Assessment & Plan    # CAD:  # Hyperlipidemia: Evaluation revealed 3 vessel CAD.  Mid LAD is 100% occluded.  90% proxomal and 99% mid LCX lesions with 85% PDA lesions.  Cardiac MRI yesterday showed >50% transmural infarct in the LAD territory.  Subclinical MI, as he denies CP history or any memorable event.  He was evaluated by CT surgery and plans for outpatient PCI in 2-3 weeks.  Continue aspirin.  LDL 85.  Will add low dose rosuvastatin and repeat lipids/CMP in 2-3 months. Add beta blocker after PPM.  # CHB:  Planning for Abbot dual chamber PPM today.   For questions or updates, please contact [facility] Please consult [email or web address] for contact info under        Signed, Chilton Si, MD  01/10/2022, 8:35 AM

## 2022-01-10 NOTE — Care Management Important Message (Signed)
Important Message  Patient Details  Name: Colin Pena MRN: [id number] Date of Birth: 1956-05-05   Medicare Important Message Given:  Yes     Renie Ora 01/10/2022, 12:27 PM

## 2022-01-10 NOTE — Progress Notes (Signed)
Electrophysiology Rounding Note  Patient Name: Colin Pena Date of Encounter: 01/10/2022  Primary Cardiologist: Olga Millers, MD Electrophysiologist: None   Subjective   Pacing deferred due to high census.   Pt had recurrent bradycardia with near syncope this am. Is NPO for pacing.   Inpatient Medications    Scheduled Meds:  aspirin EC  81 mg Oral Daily   Chlorhexidine Gluconate Cloth  6 each Topical Q0600   gentamicin (GARAMYCIN) 80 mg in sodium chloride 0.9 % 500 mL irrigation  80 mg Irrigation On Call   mupirocin ointment  1 Application Nasal BID   sodium chloride flush  3 mL Intravenous Q12H   sodium chloride flush  3 mL Intravenous Q12H   Continuous Infusions:  sodium chloride     sodium chloride     sodium chloride     sodium chloride      ceFAZolin (ANCEF) IV     PRN Meds: sodium chloride, sodium chloride, acetaminophen, albuterol, docusate sodium, hydrALAZINE, nitroGLYCERIN, ondansetron (ZOFRAN) IV, polyethylene glycol, sodium chloride flush, sodium chloride flush   Vital Signs    Vitals:   01/09/22 2000 01/10/22 0000 01/10/22 0400 01/10/22 0514  BP: 132/65 130/62  131/70  Pulse: (!) 47 66  62  Resp: 16 16    Temp: 97.8 F (36.6 C)  97.7 F (36.5 C) (!) 97.4 F (36.3 C)  TempSrc: Oral   Oral  SpO2: 97% 97%    Weight:    85.4 kg  Height:        Intake/Output Summary (Last 24 hours) at 01/10/2022 0846 Last data filed at 01/09/2022 1650 Gross per 24 hour  Intake 383.34 ml  Output --  Net 383.34 ml   Filed Weights   01/08/22 0621 01/09/22 0528 01/10/22 0514  Weight: 86.8 kg 86.3 kg 85.4 kg    Physical Exam    GEN- The patient is well appearing, alert and oriented x 3 today.   Head- normocephalic, atraumatic Eyes-  Sclera clear, conjunctiva pink Ears- hearing intact Oropharynx- clear Neck- supple Lungs- Clear to ausculation bilaterally, normal work of breathing Heart- Regular rate and rhythm, no murmurs, rubs or gallops GI- soft,  NT, ND, + BS Extremities- no clubbing or cyanosis. No edema Skin- no rash or lesion Psych- euthymic mood, full affect Neuro- strength and sensation are intact  Labs    CBC No results for input(s): "WBC", "NEUTROABS", "HGB", "HCT", "MCV", "PLT" in the last 72 hours. Basic Metabolic Panel Recent Labs    85/02/77 0425  NA 139  K 3.9  CL 105  CO2 25  GLUCOSE 103*  BUN 16  CREATININE 1.27*  CALCIUM 8.6*   Liver Function Tests No results for input(s): "AST", "ALT", "ALKPHOS", "BILITOT", "PROT", "ALBUMIN" in the last 72 hours. No results for input(s): "LIPASE", "AMYLASE" in the last 72 hours. Cardiac Enzymes No results for input(s): "CKTOTAL", "CKMB", "CKMBINDEX", "TROPONINI" in the last 72 hours.   Telemetry    2:1 AVB with intermittent CHB 40-60s mostly (personally reviewed)  Radiology    Colin CARDIAC MORPHOLOGY W WO CONTRAST  Result Date: 01/09/2022 CLINICAL DATA:  63M with HTN p/w 2:1 AV block, intermittent complete heart block. Echo 01/06/22 with EF 55% (apical akinesis/dyskinesis), normal RV function, no significant valvular disease. Cath 7/21 showed CTO mid LAD, 65% distal LM, 80% D1, 99% mid to distal LCX, 50% mid RCA, 85% RPDA. CMR to evaluate viability. EXAM: CARDIAC MRI TECHNIQUE: The patient was scanned on a 1.5 Tesla Siemens magnet. A  dedicated cardiac coil was used. Functional imaging was done using Fiesta sequences. 2,3, and 4 chamber views were done to assess for RWMA's. Modified Simpson's rule using a short axis stack was used to calculate an ejection fraction on a dedicated work [employer]. The patient received 10 cc of Gadavist. After 10 minutes inversion recovery sequences were used to assess for infiltration and scar tissue. CONTRAST:  10 cc  of Gadavist FINDINGS: Left ventricle: -Mild hypertrophy -Normal size -Normal global systolic function. Apical anterior akinesis. Small apical aneurysm -Normal ECV (27%) -Normal T2 values -Subendocardial LGE  consistent with prior infarct in mid to apical anterior wall and apex. LGE<50% transmural in mid anterior wall, suggesting viability. LGE >50% transmural in apical anterior wall and apex, suggesting nonviability in distal LAD territory. LV EF: 56% (Normal 56-78%) Absolute volumes: LV EDV: (Normal 77-195 mL) LV ESV: 75mL (Normal 19-72 mL) LV SV: 25mL (Normal 51-133 mL) CO: 3.8L/min (Normal 2.8-8.8 L/min) Indexed volumes: LV EDV: 59mL/sq-m (Normal 47-92 mL/sq-m) LV ESV: 12mL/sq-m (Normal 13-30 mL/sq-m) LV SV: 63mL/sq-m (Normal 32-62 mL/sq-m) CI: 1.9L/min/sq-m (Normal 1.7-4.2 L/min/sq-m) Right ventricle: Normal size and systolic function RV EF:  55% (Normal 47-74%) Absolute volumes: RV EDV: (Normal 88-227 mL) RV ESV: 38mL (Normal 23-103 mL) RV SV: 75mL (Normal 52-138 mL) CO: 3.9L/min (Normal 2.8-8.8 L/min) Indexed volumes: RV EDV: 41mL/sq-m (Normal 55-105 mL/sq-m) RV ESV: 85mL/sq-m (Normal 15-43 mL/sq-m) RV SV: 73mL/sq-m (Normal 32-64 mL/sq-m) CI: 1.9L/min/sq-m (Normal 1.7-4.2 L/min/sq-m) Left atrium: Normal size Right atrium: Normal size Mitral valve: Trivial regurgitation Aortic valve: Tricuspid. No regurgitation Tricuspid valve: Trivial regurgitation Pulmonic valve: No regurgitation Aorta: Normal proximal ascending aorta Pericardium: Normal IMPRESSION: 1. 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 LGE is less than 50% transmural in mid anterior wall, suggesting viability. LGE is greater than 50% transmural in apical anterior wall and apex, suggesting nonviability in distal LAD territory. 2. Normal LV size, mild hypertrophy, and normal global systolic function (EF 56%). Apical anterior akinesis. Small apical aneurysm. 3.  Normal RV size and systolic function (EF 55%) Electronically Signed   By: Epifanio Lesches M.D.   On: 01/09/2022 21:58    Patient Profile     Colin Pena is a pleasant 66yo man with hx fo HTN who presented to OSH  with new onset fatigue, presyncope and shortness of breath. He tells me he has been experiencing right neck pain. Strong family history of CAD  Assessment & Plan    Advanced AV block  2:1, intermittent CHB. Symptomatic. Explained risks, benefits, and alternatives to PPM implantation, including but not limited to bleeding, infection, pneumothorax, pericardial effusion, lead dislodgement, heart attack, stroke, or death.  Pt verbalized understanding and agrees to proceed  cMRI showed: 1. 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 LGE is less than 50% transmural in mid anterior wall, suggesting viability. LGE is greater than 50% transmural in apical anterior wall and apex, suggesting nonviability in distal LAD territory. 2. Normal LV size, mild hypertrophy, and normal global systolic function (EF 56%). Apical anterior akinesis. Small apical aneurysm. 3.  Normal RV size and systolic function (EF 55%)  Leave NPO for pacing today.   For questions or updates, please contact [facility] Please consult [email or web address] for contact info under Cardiology/STEMI.  Signed, Graciella Freer, PA-C  01/10/2022, 8:46 AM

## 2022-01-10 NOTE — Plan of Care (Signed)

## 2022-01-11 ENCOUNTER — Encounter (HOSPITAL_COMMUNITY): Payer: Self-pay | Admitting: Cardiology

## 2022-01-11 ENCOUNTER — Inpatient Hospital Stay (HOSPITAL_COMMUNITY): Payer: Medicare Other

## 2022-01-11 DIAGNOSIS — Z950 Presence of cardiac pacemaker: Secondary | ICD-10-CM

## 2022-01-11 HISTORY — DX: Presence of cardiac pacemaker: Z95.0

## 2022-01-11 MED ORDER — NITROGLYCERIN 0.4 MG SL SUBL: 0.4000 mg | SUBLINGUAL_TABLET | SUBLINGUAL | 4 refills | Status: DC | PRN

## 2022-01-11 MED ORDER — ROSUVASTATIN CALCIUM 10 MG PO TABS: 10.0000 mg | ORAL_TABLET | Freq: Every day | ORAL | 11 refills | Status: DC

## 2022-01-11 MED ORDER — METOPROLOL SUCCINATE ER 25 MG PO TB24: 25.0000 mg | ORAL_TABLET | Freq: Every day | ORAL | 6 refills | Status: DC

## 2022-01-11 MED ORDER — METOPROLOL SUCCINATE ER 25 MG PO TB24
25.0000 mg | ORAL_TABLET | Freq: Every day | ORAL | Status: DC
  Administered 2022-01-11: 25 mg via ORAL
  Filled 2022-01-11: qty 1

## 2022-01-11 MED ORDER — ACETAMINOPHEN 325 MG PO TABS: 650.0000 mg | ORAL_TABLET | ORAL | Status: AC | PRN

## 2022-01-11 NOTE — Discharge Summary (Signed)
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 LGE is less than 50% transmural in mid anterior wall, suggesting viability. LGE is greater than 50% transmural in apical anterior wall and apex, suggesting nonviability in distal LAD territory. 2. Normal LV size, mild hypertrophy, and normal global systolic function (EF 56%). Apical anterior akinesis. Small apical aneurysm. 3.  Normal RV size and systolic function (EF 55%) _____________  01/10/22  PPM SURGEON:  Steffanie Dunn, MD      PREPROCEDURE DIAGNOSES:   1.  Symptomatic complete heart block     POSTPROCEDURE DIAGNOSES:  1.  Symptomatic complete heart block     PROCEDURES:    1. Dual chamber permanent pacemaker implant      INTRODUCTION:  Colin Pena is a 66 y.o. patient with symptomatic complete heart block and severe three-vessel coronary artery disease who presents to the EP lab for PPM implant.     DESCRIPTION OF PROCEDURE:  Informed written consent was obtained and the patient was brought to the electrophysiology lab in the fasting state. Prophylactic antibiotics were given. The patient was adequately sedated with intravenous Versed, and fentanyl as outlined in the nursing report.  The patient's left chest was prepped and draped in the usual sterile  fashion by the EP lab staff.  The skin overlying the left deltopectoral region was infiltrated with lidocaine for local analgesia.  An incision was created over the left deltopectoral region.  A left prepectoral pocket was fashioned using a combination of sharp and blunt dissection.  Electrocautery was used to assure hemostasis.   Lead Placement: The left axillary vein was cannulated using ultrasound guidance.  I first passed the Tendril MRI LPA 1200M lead to the RV septum for temporary pacing support during the procedure given the patient's unreliable escape mechanism.  Next, through the left axillary vein a Wholey wire was passed to the RV. Over the wire, a septal sheath was passed to the mid septum. An RV lead (model Tendril STS 2088 TC, serial [id number]) was passed through the sheath and fixed to the RV septum.  It was advanced until there was evidence of capture of the left sided conduction system (stim-lateral QRS time 80 ms). There it displayed excelling pacing (0.75 V at 0.47ms) and sensing thresholds (greater than 10 mV) with an acceptable impedance (660 ohms). The lead was secured to the pectoral fascia. Next, the atrial lead that was positioned in the RV for temporary pacing support was freed from the myocardium and moved to the right atrium.  There, the right atrial lead (model Tendril MRI LPA1200M, serial [id number]) was advanced to the RA appendage where it displayed excellent pacing (1 V at 0.40ms) and sensing (2 mV) thresholds with an acceptable impedance (600 ohms). It was secured to the pectoral fascia. The pocket was irrigated with  copious vancomycin solution.  The leads were then  connected to a pulse generator (model [employer] Assurity MRI [id number], serial [id number]). The pocket was closed in layers of absorbable suture. EBL < 81mL. Steri-strips and a sterile dressing were applied.   During this procedure the patient is administered a total of Versed 1 mg and Fentanyl 25 mcg to achieve and maintain  moderate conscious sedation.  The patient's heart rate, blood pressure, and oxygen saturation are monitored continuously during the procedure. I was present face-to-face 100% of the time sedation was administered. Total sedation time was 48 min.     CONCLUSIONS:   1.  Symptomatic complete heart block  2.  Severe three-vessel coronary artery disease  3.  No early apparent complications.   4.  Plan for further coronary evaluation and possible intervention 2 to 3 weeks after pacemaker implant.  Cardiac cath 01/06/22 Conclusions: Severe three-vessel coronary artery disease, as detailed below. Mildly elevated left ventricular filling pressure.   Recommendations: Cardiac MRI for myocardial viability with attention on LAD territory. Cardiac surgery consultation and Heart Team evaluation to discuss optimal revascularization strategy. Follow-up EP recommendations. Aggressive secondary prevention of coronary artery disease.   Nelva Bush, MD  TTE 01/06/22 IMPRESSIONS     1. Patient in complete heart block during study Small area of apical  akinesis /dyskinesis . Left ventricular ejection fraction, by estimation,  is 55%. The left ventricle has normal function. The left ventricle  demonstrates regional wall motion  abnormalities (see scoring diagram/findings for description). There is  mild left ventricular hypertrophy. Left ventricular diastolic parameters  are indeterminate.   2. Right ventricular systolic function is normal. The right ventricular  size is normal.   3. The mitral valve is normal in structure. No evidence of mitral valve  regurgitation. No evidence of mitral stenosis.   4. The aortic valve is tricuspid. There is mild calcification of the  aortic valve. Aortic valve regurgitation is not visualized. Aortic valve  sclerosis is present, with no evidence of aortic valve stenosis.   5. The inferior vena cava is normal in size with greater than 50%  respiratory variability,  suggesting right atrial pressure of 3 mmHg.   FINDINGS   Left Ventricle: Patient in complete heart block during study Small area  of apical akinesis /dyskinesis. Left ventricular ejection fraction, by  estimation, is 55%. The left ventricle has normal function. The left  ventricle demonstrates regional wall  motion abnormalities. The left ventricular internal cavity size was normal  in size. There is mild left ventricular hypertrophy. Left ventricular  diastolic parameters are indeterminate.   Right Ventricle: The right ventricular size is normal. No increase in  right ventricular wall thickness. Right ventricular systolic function is  normal.   Left Atrium: Left atrial size was normal in size.   Right Atrium: Right atrial size was normal in size.   Pericardium: There is no evidence of pericardial effusion.   Mitral Valve: The mitral valve is normal in structure. No evidence of  mitral valve regurgitation. No evidence of mitral valve stenosis.   Tricuspid Valve: The tricuspid valve is normal in structure. Tricuspid  valve regurgitation is not demonstrated. No evidence of tricuspid  stenosis.   Aortic Valve: The aortic valve is tricuspid. There is mild calcification  of the aortic valve. Aortic valve regurgitation is not visualized. Aortic  valve sclerosis is present, with no evidence of aortic valve stenosis.  Aortic valve peak gradient measures  7.0 mmHg.   Pulmonic Valve: The  pulmonic valve was normal in structure. Pulmonic valve  regurgitation is not visualized. No evidence of pulmonic stenosis.   Aorta: The aortic root is normal in size and structure.   Venous: The inferior vena cava is normal in size with greater than 50%  respiratory variability, suggesting right atrial pressure of 3 mmHg.   IAS/Shunts: No atrial level shunt detected by color flow Doppler.  History of Present Illness     Colin Pena is a 66 y.o. male with hx of HTN and no cardiac hx presented  to ER 01/05/22 with 10 day hx of intermittent dyspnea, fatigue, and diaphoresis and found to be in CHB.   He does have FH of CAD with his father with MI in his 14s.  10 days prior to admit he developed sudden on set of profound dyspnea and associated with lightheadedness and diaphoresis. No chest pain.  Symptoms lasted a few min.  He had several episode prior to presentation.  On the 20th he felt well then sudden SOB again without chest discomfort.  The symptoms lasted longer than previously so he drove himself to ER.    On EKG he was in 2:1 heart block, HR 54 and new RBBB.  AKI with Dr 1.37 mildly elevated LFTs.  Chest x-ray showed elevated right hemidiaphragm with minimal right basilar subsegmental atelectasis.   Tele with intermittent CHB with HR in 30s  he was admitted for further eval.  With symptomatic heart block.  Troponnis were neg 8,9,10 and TSH 1.540.    Hospital Course     Consultants: EP Dr.  Lalla Brothers  CT surgery  Dr. Donata Clay.  Pt underwent cardiac cath to eval cause of CHB with his FH of MI.  Cath as above and need for PCI with his 3 vessel CAD  Mid LAD is 100% occluded.  90% proxomal and 99% mid LCX lesions with 85% PDA lesions.   Cardiac MRI yesterday showed >50% transmural infarct in the LAD territory.  Subclinical MI, as he denies CP history or any memorable event.  He was evaluated by CT surgery Dr. Donata Clay with surgical revascularization of the chronically occluded LAD would not be possible.  It is his (the pt's) strong preference to have PCI treatment of his other coronary disease and plans for outpatient PCI in 2-3 weeks .  Plan to discuss and cardiac meeting on Friday and will arrange follow up for complex PCI.  Potentially eventual CTO.Continue ASA and crestor. Also with BB.   It is recommended to wait 2-3 weeks prior to PCI to reduce risk of bleeding at [facility] site.   With his CHB -symptomatic he was seen by EP Dr. Lalla Brothers and with cath report and cMRI it was felt pt would  benefit from PPM.  The night before PPM pt had several episodes of symptomatic CHB with ventricular escape.   He underwent PPM 01/10/22 and was without complications.  See above. Today he device interrogated and found to be stable.  On 2V CXR no pneumothorax was seen , no pl effusions.   Today pt has been seen and evaluated by EP team and found stable for discharge.  They have reviewed wound care with him as well.    Did the patient have an acute coronary syndrome (MI, NSTEMI, STEMI, etc) this admission?:  Yes  AHA/ACC Clinical Performance & Quality Measures: Aspirin prescribed? - Yes ADP Receptor Inhibitor (Plavix/Clopidogrel, Brilinta/Ticagrelor or Effient/Prasugrel) prescribed (includes medically managed patients)? - no no PCI and PPM placed.  Beta Blocker prescribed? - Yes High Intensity Statin (Lipitor 40-80mg  or Crestor 20-40mg ) prescribed? - Yes EF assessed during THIS hospitalization? - Yes For EF <40%, was ACEI/ARB prescribed? - Not Applicable (EF >/= [id number]) For EF <40%, Aldosterone Antagonist (Spironolactone or Eplerenone) prescribed? - Not Applicable (EF >/= [id number]) Cardiac Rehab Phase II ordered (including medically managed patients)? - No - will wait until post PCI        The patient will be scheduled for a TOC follow up appointment in10 days.  A message has been sent to the [facility] and Scheduling Pool at the office where the patient should be seen for follow up.  _____________  Discharge Vitals Blood pressure (!) 147/87, pulse 80, temperature (!) 97.4 F (36.3 C), temperature source Oral, resp. rate 17, height 5\' 9"  (1.753 m), weight 85.4 kg, SpO2 99 %.  Filed Weights   01/09/22 0528 01/10/22 0514 01/11/22 0500  Weight: 86.3 kg 85.4 kg 85.4 kg    Labs & Radiologic Studies    CBC No results for input(s): "WBC", "NEUTROABS", "HGB", "HCT", "MCV", "PLT" in the last 72 hours. Basic Metabolic Panel Recent Labs    01/09/22 0425  NA 139  K 3.9  CL  105  CO2 25  GLUCOSE 103*  BUN 16  CREATININE 1.27*  CALCIUM 8.6*   Liver Function Tests No results for input(s): "AST", "ALT", "ALKPHOS", "BILITOT", "PROT", "ALBUMIN" in the last 72 hours. No results for input(s): "LIPASE", "AMYLASE" in the last 72 hours. High Sensitivity Troponin:   Recent Labs  Lab 01/05/22 1400 01/05/22 1859 01/06/22 0917  TROPONINIHS 8 9 10     BNP Invalid input(s): "POCBNP" D-Dimer No results for input(s): "DDIMER" in the last 72 hours. Hemoglobin A1C No results for input(s): "HGBA1C" in the last 72 hours. Fasting Lipid Panel No results for input(s): "CHOL", "HDL", "LDLCALC", "TRIG", "CHOLHDL", "LDLDIRECT" in the last 72 hours. Thyroid Function Tests No results for input(s): "TSH", "T4TOTAL", "T3FREE", "THYROIDAB" in the last 72 hours.  Invalid input(s): "FREET3" _____________  DG Chest 2 View  Result Date: 01/11/2022 CLINICAL DATA:  Pacemaker insertion. EXAM: CHEST - 2 VIEW COMPARISON:  PA and lateral 01/05/2022. FINDINGS: PA Lat at 5:22 a.m. Left infraclavicular power source has been inserted and is partially visible. Dual leads extend from the power source with 1 terminating in the right atrium and the other the right ventricle. There is overlying monitor wiring. No pneumothorax is seen. Heart size and vasculature are normal. Chronic elevated right hemidiaphragm associated with overlying right basal subsegmental atelectasis. The lungs are otherwise clear. No pleural effusion is seen. There is thoracic spondylosis. IMPRESSION: No evidence of acute chest disease. Left chest pacing system with right atrial and ventricular wire insertions. Electronically Signed   By: Telford Nab M.D.   On: 01/11/2022 07:29   EP PPM/ICD IMPLANT  Result Date: 01/10/2022  CONCLUSIONS:  1.  Symptomatic complete heart block  2.  Severe three-vessel coronary artery disease  3.  No early apparent complications.  4.  Plan for further coronary evaluation and possible intervention 2  to 3 weeks after pacemaker implant.   MR CARDIAC MORPHOLOGY W WO CONTRAST  Result Date: 01/09/2022 CLINICAL DATA:  89M with HTN p/w 2:1 AV block, intermittent complete heart block. Echo 01/06/22 with EF 55% (apical akinesis/dyskinesis), normal RV function, no significant  valvular disease. Cath 7/21 showed CTO mid LAD, 65% distal LM, 80% D1, 99% mid to distal LCX, 50% mid RCA, 85% RPDA. CMR to evaluate viability. EXAM: CARDIAC MRI TECHNIQUE: The patient was scanned on a 1.5 Tesla Siemens magnet. A dedicated cardiac coil was used. Functional imaging was done using Fiesta sequences. 2,3, and 4 chamber views were done to assess for RWMA's. Modified Simpson's rule using a short axis stack was used to calculate an ejection fraction on a dedicated work Conservation officer, nature. The patient received 10 cc of Gadavist. After 10 minutes inversion recovery sequences were used to assess for infiltration and scar tissue. CONTRAST:  10 cc  of Gadavist FINDINGS: Left ventricle: -Mild hypertrophy -Normal size -Normal global systolic function. Apical anterior akinesis. Small apical aneurysm -Normal ECV (27%) -Normal T2 values -Subendocardial LGE consistent with prior infarct in mid to apical anterior wall and apex. LGE<50% transmural in mid anterior wall, suggesting viability. LGE >50% transmural in apical anterior wall and apex, suggesting nonviability in distal LAD territory. LV EF: 56% (Normal 56-78%) Absolute volumes: LV EDV: 131mL (Normal 77-195 mL) LV ESV: 1mL (Normal 19-72 mL) LV SV: 37mL (Normal 51-133 mL) CO: 3.8L/min (Normal 2.8-8.8 L/min) Indexed volumes: LV EDV: 34mL/sq-m (Normal 47-92 mL/sq-m) LV ESV: 78mL/sq-m (Normal 13-30 mL/sq-m) LV SV: 36mL/sq-m (Normal 32-62 mL/sq-m) CI: 1.9L/min/sq-m (Normal 1.7-4.2 L/min/sq-m) Right ventricle: Normal size and systolic function RV EF:  55% (Normal 47-74%) Absolute volumes: RV EDV: 17mL (Normal 88-227 mL) RV ESV: 27mL (Normal 23-103 mL) RV SV: 59mL (Normal 52-138 mL) CO:  3.9L/min (Normal 2.8-8.8 L/min) Indexed volumes: RV EDV: 6mL/sq-m (Normal 55-105 mL/sq-m) RV ESV: 55mL/sq-m (Normal 15-43 mL/sq-m) RV SV: 25mL/sq-m (Normal 32-64 mL/sq-m) CI: 1.9L/min/sq-m (Normal 1.7-4.2 L/min/sq-m) Left atrium: Normal size Right atrium: Normal size Mitral valve: Trivial regurgitation Aortic valve: Tricuspid. No regurgitation Tricuspid valve: Trivial regurgitation Pulmonic valve: No regurgitation Aorta: Normal proximal ascending aorta Pericardium: Normal IMPRESSION: 1. 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 LGE is less than 50% transmural in mid anterior wall, suggesting viability. LGE is greater than 50% transmural in apical anterior wall and apex, suggesting nonviability in distal LAD territory. 2. Normal LV size, mild hypertrophy, and normal global systolic function (EF 0000000). Apical anterior akinesis. Small apical aneurysm. 3.  Normal RV size and systolic function (EF [id number]) Electronically Signed   By: Oswaldo Milian M.D.   On: 01/09/2022 21:58   CARDIAC CATHETERIZATION  Result Date: 01/06/2022 Conclusions: Severe three-vessel coronary artery disease, as detailed below. Mildly elevated left ventricular filling pressure. Recommendations: Cardiac MRI for myocardial viability with attention on LAD territory. Cardiac surgery consultation and Heart Team evaluation to discuss optimal revascularization strategy. Follow-up EP recommendations. Aggressive secondary prevention of coronary artery disease. Nelva Bush, MD [facility]  ECHOCARDIOGRAM COMPLETE  Result Date: 01/06/2022    ECHOCARDIOGRAM REPORT   Patient Name:   Colin Pena Date of Exam: 01/06/2022 Medical Rec #:  [id number]          Height:       68.0 in Accession #:    [id number]         Weight:       198.0 lb Date of Birth:  1955/08/05          BSA:          2.035 m Patient Age:    75 years           BP:  156/85 mmHg Patient Gender: M                   HR:           47 bpm. Exam Location:  Inpatient Procedure: 2D Echo, Cardiac Doppler and Color Doppler STAT ECHO Indications:    Heart block  History:        Patient has no prior history of Echocardiogram examinations.                 Risk Factors:Hypertension.  Sonographer:    Jyl Heinz Referring Phys: [phone] CHRISTOPHER RONALD [place]  1. Patient in complete heart block during study Small area of apical akinesis /dyskinesis . Left ventricular ejection fraction, by estimation, is 55%. The left ventricle has normal function. The left ventricle demonstrates regional wall motion abnormalities (see scoring diagram/findings for description). There is mild left ventricular hypertrophy. Left ventricular diastolic parameters are indeterminate.  2. Right ventricular systolic function is normal. The right ventricular size is normal.  3. The mitral valve is normal in structure. No evidence of mitral valve regurgitation. No evidence of mitral stenosis.  4. The aortic valve is tricuspid. There is mild calcification of the aortic valve. Aortic valve regurgitation is not visualized. Aortic valve sclerosis is present, with no evidence of aortic valve stenosis.  5. The inferior vena cava is normal in size with greater than 50% respiratory variability, suggesting right atrial pressure of 3 mmHg. FINDINGS  Left Ventricle: Patient in complete heart block during study Small area of apical akinesis /dyskinesis. Left ventricular ejection fraction, by estimation, is 55%. The left ventricle has normal function. The left ventricle demonstrates regional wall motion abnormalities. The left ventricular internal cavity size was normal in size. There is mild left ventricular hypertrophy. Left ventricular diastolic parameters are indeterminate. Right Ventricle: The right ventricular size is normal. No increase in right ventricular wall thickness. Right ventricular systolic function is normal. Left Atrium: Left atrial  size was normal in size. Right Atrium: Right atrial size was normal in size. Pericardium: There is no evidence of pericardial effusion. Mitral Valve: The mitral valve is normal in structure. No evidence of mitral valve regurgitation. No evidence of mitral valve stenosis. Tricuspid Valve: The tricuspid valve is normal in structure. Tricuspid valve regurgitation is not demonstrated. No evidence of tricuspid stenosis. Aortic Valve: The aortic valve is tricuspid. There is mild calcification of the aortic valve. Aortic valve regurgitation is not visualized. Aortic valve sclerosis is present, with no evidence of aortic valve stenosis. Aortic valve peak gradient measures 7.0 mmHg. Pulmonic Valve: The pulmonic valve was normal in structure. Pulmonic valve regurgitation is not visualized. No evidence of pulmonic stenosis. Aorta: The aortic root is normal in size and structure. Venous: The inferior vena cava is normal in size with greater than 50% respiratory variability, suggesting right atrial pressure of 3 mmHg. IAS/Shunts: No atrial level shunt detected by color flow Doppler.  LEFT VENTRICLE PLAX 2D LVIDd:         4.50 cm      Diastology LVIDs:         2.80 cm      LV e' medial:    7.29 cm/s LV PW:         1.10 cm      LV E/e' medial:  11.8 LV IVS:        1.20 cm      LV e' lateral:   8.92 cm/s LVOT diam:     2.00  cm      LV E/e' lateral: 9.6 LV SV:         77 LV SV Index:   38 LVOT Area:     3.14 cm  LV Volumes (MOD) LV vol d, MOD A2C: 113.0 ml LV vol d, MOD A4C: 95.5 ml LV vol s, MOD A2C: 47.4 ml LV vol s, MOD A4C: 40.5 ml LV SV MOD A2C:     65.6 ml LV SV MOD A4C:     95.5 ml LV SV MOD BP:      59.9 ml RIGHT VENTRICLE             IVC RV Basal diam:  2.90 cm     IVC diam: 1.50 cm RV Mid diam:    2.60 cm RV S prime:     13.40 cm/s TAPSE (M-mode): 2.4 cm LEFT ATRIUM             Index        RIGHT ATRIUM           Index LA diam:        3.50 cm 1.72 cm/m   RA Area:     14.50 cm LA Vol (A2C):   33.4 ml 16.41 ml/m  RA  Volume:   35.00 ml  17.20 ml/m LA Vol (A4C):   30.3 ml 14.89 ml/m LA Biplane Vol: 31.5 ml 15.48 ml/m  AORTIC VALVE AV Area (Vmax): 2.97 cm AV Vmax:        132.00 cm/s AV Peak Grad:   7.0 mmHg LVOT Vmax:      125.00 cm/s LVOT Vmean:     79.000 cm/s LVOT VTI:       0.246 m  AORTA Ao Root diam: 3.00 cm Ao Asc diam:  2.80 cm MITRAL VALVE               TRICUSPID VALVE MV Area (PHT): 4.19 cm    TR Peak grad:   25.8 mmHg MV Decel Time: 181 msec    TR Vmax:        254.00 cm/s MV E velocity: 85.70 cm/s MV A velocity: 69.80 cm/s  SHUNTS MV E/A ratio:  1.23        Systemic VTI:  0.25 m                            Systemic Diam: 2.00 cm Jenkins Rouge MD Electronically signed by Jenkins Rouge MD Signature Date/Time: 01/06/2022/9:40:40 AM    Final    DG Chest 2 View  Result Date: 01/05/2022 CLINICAL DATA:  Dyspnea with exertion. EXAM: CHEST - 2 VIEW COMPARISON:  June 28, 2015. FINDINGS: The heart size and mediastinal contours are within normal limits. Left lung is clear. Elevated right hemidiaphragm is noted with minimal right basilar subsegmental atelectasis. The visualized skeletal structures are unremarkable. IMPRESSION: Elevated right hemidiaphragm with minimal right basilar subsegmental atelectasis. Electronically Signed   By: Marijo Conception M.D.   On: 01/05/2022 13:52    Disposition   Pt is being discharged home today in good condition.  Follow-up Plans & Appointments   Heart Healthy Diet  Call [facility] at [phone] if any bleeding, swelling or drainage at cath site.  May shower, no tub baths for 48 hours for groin sticks. No lifting over 5 pounds for 3 days.  No Driving for 5 days   Follow-up [facility] Follow up.   Why: on 8/16 at 1040 for post pacemaker check Contact information: [place] [phone]        [place], Peter M, MD Follow up.    Specialty: Cardiology Why: the office will call you Friday or on Monday - if you have not heard by Tuesday AM call the office at above number and ask to speak to Dr. Doug Sou nurse- Malachy Mood. Contact information: [place] [address] [phone]                  Discharge Medications   Allergies as of 01/11/2022   No Known Allergies      Medication List     STOP taking these medications    amLODipine 10 MG tablet Commonly known as: NORVASC   lisinopril-hydrochlorothiazide 20-12.5 MG tablet Commonly known as: Zestoretic       TAKE these medications    acetaminophen 325 MG tablet Commonly known as: TYLENOL Take 2 tablets (650 mg total) by mouth every 4 (four) hours as needed for headache or mild pain.   aspirin 81 MG chewable tablet Chew 81 mg by mouth daily.   metoprolol succinate 25 MG 24 hr tablet Commonly known as: TOPROL-XL Take 1 tablet (25 mg total) by mouth daily. Start taking on: January 12, 2022   nitroGLYCERIN 0.4 MG SL tablet Commonly known as: NITROSTAT Place 1 tablet (0.4 mg total) under the tongue every 5 (five) minutes x 3 doses as needed for chest pain.   NONFORMULARY OR COMPOUNDED ITEM Triamcinolone 0.1%/Eucerin. Unknown ratio.1 application topically daily as directed. What changed: additional instructions   rosuvastatin 10 MG tablet Commonly known as: CRESTOR Take 1 tablet (10 mg total) by mouth daily. Start taking on: January 12, 2022           Outstanding Labs/Studies   BMP consider  Hepatic and lipid in 6 weeks.  Plan for PCI  Duration of Discharge Encounter   Greater than 30 minutes including physician time.  Signed, Cecilie Kicks, NP 01/11/2022, 12:12 PM

## 2022-01-11 NOTE — Discharge Instructions (Addendum)
After Your Pacemaker   You have a [employer] Pacemaker  ACTIVITY Do not lift your arm above shoulder height for 1 week after your procedure. After 7 days, you may progress as below.  You should remove your sling 24 hours after your procedure, unless otherwise instructed by your provider.     Wednesday January 18, 2022  Thursday January 19, 2022 Friday January 20, 2022 Saturday January 21, 2022   Do not lift, push, pull, or carry anything over 10 pounds with the affected arm until 6 weeks (Wednesday February 22, 2022 ) after your procedure.   You may drive AFTER your wound check, unless you have been told otherwise by your provider.   Ask your healthcare provider when you can go back to work   INCISION/Dressing If you are on a blood thinner such as Coumadin, Xarelto, Eliquis, Plavix, or Pradaxa please confirm with your provider when this should be resumed.   If large square, outer bandage is left in place, this can be removed after 24 hours from your procedure. Do not remove steri-strips or glue as below.   Monitor your Pacemaker site for redness, swelling, and drainage. Call the device clinic at [phone] if you experience these symptoms or fever/chills.  If your incision is sealed with Steri-strips or staples, you may shower 7 days after your procedure or when told by your provider. Do not remove the steri-strips or let the shower hit directly on your site. You may wash around your site with soap and water.    If you were discharged in a sling, please do not wear this during the day more than 48 hours after your surgery unless otherwise instructed. This may increase the risk of stiffness and soreness in your shoulder.   Avoid lotions, ointments, or perfumes over your incision until it is well-healed.  You may use a hot tub or a pool AFTER your wound check appointment if the incision is completely closed.  Pacemaker Alerts:  Some alerts are vibratory and others beep. These are NOT  emergencies. Please call our office to let us know. If this occurs at night or on weekends, it can wait until the next business day. Send a remote transmission.  If your device is capable of reading fluid status (for heart failure), you will be offered monthly monitoring to review this with you.   DEVICE MANAGEMENT Remote monitoring is used to monitor your pacemaker from home. This monitoring is scheduled every 91 days by our office. It allows [place] to keep an eye on the functioning of your device to ensure it is working properly. You will routinely see your Electrophysiologist annually (more often if necessary).   You should receive your ID card for your new device in 4-8 weeks. Keep this card with you at all times once received. Consider wearing a medical alert bracelet or necklace.  Your Pacemaker may be MRI compatible. This will be discussed at your next office visit/wound check.  You should avoid contact with strong electric or magnetic fields.   Do not use amateur (ham) radio equipment or electric (arc) welding torches. MP3 player headphones with magnets should not be used. Some devices are safe to use if held at least 12 inches (30 cm) from your Pacemaker. These include power tools, lawn mowers, and speakers. If you are unsure if something is safe to use, ask your health care provider.  When using your cell phone, hold it to the ear that is on the opposite side from  the Pacemaker. Do not leave your cell phone in a pocket over the Pacemaker.  You may safely use electric blankets, heating pads, computers, and microwave ovens.  Call the office right away if: You have chest pain. You feel more short of breath than you have felt before. You feel more light-headed than you have felt before. Your incision starts to open up.  This information is not intended to replace advice given to you by your health care provider. Make sure you discuss any questions you have with your health care provider.    Heart Healthy Diet  Call [facility] at [phone] if any bleeding, swelling or drainage at cath site.  May shower, no tub baths for 48 hours for groin sticks. No lifting over 5 pounds for 3 days.  No Driving for 5 days

## 2022-01-11 NOTE — Progress Notes (Signed)
Progress Note  Patient Name: Colin Pena Date of Encounter: 01/11/2022  [facility] Cardiologist: Kirk Ruths, MD   Subjective   Feeling better.  No CP/SOB.  Mildly dizzy when he first got up today.   Inpatient Medications    Scheduled Meds:  aspirin EC  81 mg Oral Daily   Chlorhexidine Gluconate Cloth  6 each Topical Q0600   metoprolol succinate  25 mg Oral Daily   mupirocin ointment  1 Application Nasal BID   rosuvastatin  10 mg Oral Daily   sodium chloride flush  3 mL Intravenous Q12H   sodium chloride flush  3 mL Intravenous Q12H   Continuous Infusions:  sodium chloride     PRN Meds: sodium chloride, acetaminophen, albuterol, docusate sodium, hydrALAZINE, nitroGLYCERIN, ondansetron (ZOFRAN) IV, polyethylene glycol, sodium chloride flush   Vital Signs    Vitals:   01/10/22 1244 01/10/22 1946 01/11/22 0500 01/11/22 0608  BP: 134/87 121/81  113/72  Pulse: 75   72  Resp: 16 16  17   Temp: 97.9 F (36.6 C) 97.8 F (36.6 C)  97.6 F (36.4 C)  TempSrc: Oral Oral  Oral  SpO2: 97%   99%  Weight:   85.4 kg   Height:       No intake or output data in the 24 hours ending 01/11/22 0852     01/11/2022    5:00 AM 01/10/2022    5:14 AM 01/09/2022    5:28 AM  Last 3 Weights  Weight (lbs) 188 lb 4.4 oz 188 lb 4.8 oz 190 lb 3.2 oz  Weight (kg) 85.4 kg 85.412 kg 86.274 kg      Telemetry    Complete heart block.  Mostly 2:1 high degree AV block.  He had a 4.4 second ventricular pause.  - Personally Reviewed  ECG    2:1 high degree AV block.  Ventricular rate 41 bpm.  RBBB. - Personally Reviewed  Physical Exam   VS:  BP 113/72 (BP Location: Right Arm)   Pulse 72   Temp 97.6 F (36.4 C) (Oral)   Resp 17   Ht 5\' 9"  (1.753 m)   Wt 85.4 kg   SpO2 99%   BMI 27.80 kg/m  , BMI Body mass index is 27.8 kg/m. GENERAL:  Well appearing HEENT: Pupils equal round and reactive, fundi not visualized, oral mucosa unremarkable NECK:  No jugular venous  distention, waveform within normal limits, carotid upstroke brisk and symmetric, no bruits, no thyromegaly CHEST: L PPM site C/D/I.  No hematoma. LUNGS:  Clear to auscultation bilaterally HEART:  Bradycardic.  Regular rhythm.  PMI not displaced or sustained,S1 and S2 within normal limits, no S3, no S4, no clicks, no rubs, no murmurs ABD:  Flat, positive bowel sounds normal in frequency in pitch, no bruits, no rebound, no guarding, no midline pulsatile mass, no hepatomegaly, no splenomegaly EXT:  2 plus pulses throughout, no edema, no cyanosis no clubbing SKIN:  No rashes no nodules NEURO:  Cranial nerves II through XII grossly intact, motor grossly intact throughout PSYCH:  Cognitively intact, oriented to person place and time   Labs    High Sensitivity Troponin:   Recent Labs  Lab 01/05/22 1400 01/05/22 1859 01/06/22 0917  TROPONINIHS 8 9 10      Chemistry Recent Labs  Lab 01/05/22 1400 01/06/22 0917 01/07/22 0215 01/09/22 0425  NA 142 139 133* 139  K 4.4 3.8 3.5 3.9  CL 111 107 104 105  CO2 24 21* 21*  25  GLUCOSE 122* 114* 102* 103*  BUN 18 16 16 16   CREATININE 1.37* 0.95 1.19 1.27*  CALCIUM 8.9 8.8* 8.3* 8.6*  PROT 7.1  --   --   --   ALBUMIN 4.0  --   --   --   AST 46*  --   --   --   ALT 48*  --   --   --   ALKPHOS 66  --   --   --   BILITOT 0.5  --   --   --   GFRNONAA 57* >60 >60 >60  ANIONGAP 7 11 8 9     Lipids  Recent Labs  Lab 01/07/22 0215  CHOL 139  TRIG 145  HDL 25*  LDLCALC 85  CHOLHDL 5.6    Hematology Recent Labs  Lab 01/05/22 1400 01/07/22 0215  WBC 6.0 5.8  RBC 4.63 4.39  HGB 14.0 13.2  HCT 41.8 37.4*  MCV 90.3 85.2  MCH 30.2 30.1  MCHC 33.5 35.3  RDW 13.0 12.2  PLT 188 178   Thyroid  Recent Labs  Lab 01/06/22 0917  TSH 1.540    BNPNo results for input(s): "BNP", "PROBNP" in the last 168 hours.  DDimer  Recent Labs  Lab 01/05/22 2149  DDIMER 0.56*     Radiology      Cardiac Studies   Cardiac MRI  01/09/22: IMPRESSION: 1. 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 LGE is less than 50% transmural in mid anterior wall, suggesting viability. LGE is greater than 50% transmural in apical anterior wall and apex, suggesting nonviability in distal LAD territory.   2. Normal LV size, mild hypertrophy, and normal global systolic function (EF 56%). Apical anterior akinesis. Small apical aneurysm.   3.  Normal RV size and systolic function (EF 55%)   LHC 01/06/22: Conclusions: Severe three-vessel coronary artery disease, as detailed below. Mildly elevated left ventricular filling pressure.   Recommendations: Cardiac MRI for myocardial viability with attention on LAD territory. Cardiac surgery consultation and Heart Team evaluation to discuss optimal revascularization strategy. Follow-up EP recommendations. Aggressive secondary prevention of coronary artery disease.   Echo 01/06/22: IMPRESSIONS    1. Patient in complete heart block during study Small area of apical  akinesis /dyskinesis . Left ventricular ejection fraction, by estimation,  is 55%. The left ventricle has normal function. The left ventricle  demonstrates regional wall motion  abnormalities (see scoring diagram/findings for description). There is  mild left ventricular hypertrophy. Left ventricular diastolic parameters  are indeterminate.   2. Right ventricular systolic function is normal. The right ventricular  size is normal.   3. The mitral valve is normal in structure. No evidence of mitral valve  regurgitation. No evidence of mitral stenosis.   4. The aortic valve is tricuspid. There is mild calcification of the  aortic valve. Aortic valve regurgitation is not visualized. Aortic valve  sclerosis is present, with no evidence of aortic valve stenosis.   5. The inferior vena cava is normal in size with greater than 50%  respiratory variability,  suggesting right atrial pressure of 3 mmHg.   Patient Profile     66 y.o. male with hypertension who presented with fatigue, pre-syncope and SOB and was found to have intermittent CHB.    Assessment & Plan    # CAD:  # Hyperlipidemia: Evaluation revealed 3 vessel CAD.  Mid LAD is 100% occluded.  90% proxomal and 99%  mid LCX lesions with 85% PDA lesions.  Cardiac MRI yesterday showed >50% transmural infarct in the LAD territory.  Subclinical MI, as he denies CP history or any memorable event.  He was evaluated by CT surgery and plans for outpatient PCI in 2-3 weeks.  We will arrange f/u with IC in a week.  Continue aspirin.  LDL 85.  Added rosuvastatin with plans to repeat lipids/CMP in 2-3 months. Add metoprolol succinate 25mg  daily.   # CHB:  S/p Abbott dual chamber PPM today. Stable for discharge per EP.  For questions or updates, please contact [facility] Please consult [email or web address] for contact info under        Signed, , MD  01/11/2022, 8:52 AM

## 2022-01-11 NOTE — Progress Notes (Signed)
Electrophysiology Rounding Note  Patient Name: Colin Pena Date of Encounter: 01/11/2022  Primary Cardiologist: Kirk Ruths, MD Electrophysiologist: Dr. Quentin Ore   Subjective   The patient is doing well today.  At this time, the patient denies chest pain, shortness of breath, or any new concerns.  Inpatient Medications    Scheduled Meds:  aspirin EC  81 mg Oral Daily   Chlorhexidine Gluconate Cloth  6 each Topical Q0600   mupirocin ointment  1 Application Nasal BID   rosuvastatin  10 mg Oral Daily   sodium chloride flush  3 mL Intravenous Q12H   sodium chloride flush  3 mL Intravenous Q12H   Continuous Infusions:  sodium chloride     PRN Meds: sodium chloride, acetaminophen, albuterol, docusate sodium, hydrALAZINE, nitroGLYCERIN, ondansetron (ZOFRAN) IV, polyethylene glycol, sodium chloride flush   Vital Signs    Vitals:   01/10/22 1244 01/10/22 1946 01/11/22 0500 01/11/22 0608  BP: 134/87 121/81  113/72  Pulse: 75   72  Resp: 16 16  17   Temp: 97.9 F (36.6 C) 97.8 F (36.6 C)  97.6 F (36.4 C)  TempSrc: Oral Oral  Oral  SpO2: 97%   99%  Weight:   85.4 kg   Height:       No intake or output data in the 24 hours ending 01/11/22 0716 Filed Weights   01/09/22 0528 01/10/22 0514 01/11/22 0500  Weight: 86.3 kg 85.4 kg 85.4 kg    Physical Exam    GEN- The patient is well appearing, alert and oriented x 3 today.   Head- normocephalic, atraumatic Eyes-  Sclera clear, conjunctiva pink Ears- hearing intact Oropharynx- clear Neck- supple Lungs- Clear to ausculation bilaterally, normal work of breathing Heart- Regular rate and rhythm, no murmurs, rubs or gallops GI- soft, NT, ND, + BS Extremities- no clubbing or cyanosis. No edema Skin- no rash or lesion Psych- euthymic mood, full affect Neuro- strength and sensation are intact  Labs    CBC No results for input(s): "WBC", "NEUTROABS", "HGB", "HCT", "MCV", "PLT" in the last 72 hours. Basic  Metabolic Panel Recent Labs    01/09/22 0425  NA 139  K 3.9  CL 105  CO2 25  GLUCOSE 103*  BUN 16  CREATININE 1.27*  CALCIUM 8.6*   Liver Function Tests No results for input(s): "AST", "ALT", "ALKPHOS", "BILITOT", "PROT", "ALBUMIN" in the last 72 hours. No results for input(s): "LIPASE", "AMYLASE" in the last 72 hours. Cardiac Enzymes No results for input(s): "CKTOTAL", "CKMB", "CKMBINDEX", "TROPONINI" in the last 72 hours.   Telemetry    V paced in 70s (personally reviewed)  Radiology    EP PPM/ICD IMPLANT  Result Date: 01/10/2022  CONCLUSIONS:  1.  Symptomatic complete heart block  2.  Severe three-vessel coronary artery disease  3.  No early apparent complications.  4.  Plan for further coronary evaluation and possible intervention 2 to 3 weeks after pacemaker implant.   Colin CARDIAC MORPHOLOGY W WO CONTRAST  Result Date: 01/09/2022 CLINICAL DATA:  89M with HTN p/w 2:1 AV block, intermittent complete heart block. Echo 01/06/22 with EF 55% (apical akinesis/dyskinesis), normal RV function, no significant valvular disease. Cath 7/21 showed CTO mid LAD, 65% distal LM, 80% D1, 99% mid to distal LCX, 50% mid RCA, 85% RPDA. CMR to evaluate viability. EXAM: CARDIAC MRI TECHNIQUE: The patient was scanned on a 1.5 Tesla Siemens magnet. A dedicated cardiac coil was used. Functional imaging was done using Fiesta sequences. 2,3, and 4 chamber  views were done to assess for RWMA's. Modified Simpson's rule using a short axis stack was used to calculate an ejection fraction on a dedicated work [employer]. The patient received 10 cc of Gadavist. After 10 minutes inversion recovery sequences were used to assess for infiltration and scar tissue. CONTRAST:  10 cc  of Gadavist FINDINGS: Left ventricle: -Mild hypertrophy -Normal size -Normal global systolic function. Apical anterior akinesis. Small apical aneurysm -Normal ECV (27%) -Normal T2 values -Subendocardial LGE consistent with  prior infarct in mid to apical anterior wall and apex. LGE<50% transmural in mid anterior wall, suggesting viability. LGE >50% transmural in apical anterior wall and apex, suggesting nonviability in distal LAD territory. LV EF: 56% (Normal 56-78%) Absolute volumes: LV EDV: (Normal 77-195 mL) LV ESV: 85mL (Normal 19-72 mL) LV SV: 17mL (Normal 51-133 mL) CO: 3.8L/min (Normal 2.8-8.8 L/min) Indexed volumes: LV EDV: 3mL/sq-m (Normal 47-92 mL/sq-m) LV ESV: 55mL/sq-m (Normal 13-30 mL/sq-m) LV SV: 100mL/sq-m (Normal 32-62 mL/sq-m) CI: 1.9L/min/sq-m (Normal 1.7-4.2 L/min/sq-m) Right ventricle: Normal size and systolic function RV EF:  55% (Normal 47-74%) Absolute volumes: RV EDV: (Normal 88-227 mL) RV ESV: 51mL (Normal 23-103 mL) RV SV: 62mL (Normal 52-138 mL) CO: 3.9L/min (Normal 2.8-8.8 L/min) Indexed volumes: RV EDV: 25mL/sq-m (Normal 55-105 mL/sq-m) RV ESV: 61mL/sq-m (Normal 15-43 mL/sq-m) RV SV: 49mL/sq-m (Normal 32-64 mL/sq-m) CI: 1.9L/min/sq-m (Normal 1.7-4.2 L/min/sq-m) Left atrium: Normal size Right atrium: Normal size Mitral valve: Trivial regurgitation Aortic valve: Tricuspid. No regurgitation Tricuspid valve: Trivial regurgitation Pulmonic valve: No regurgitation Aorta: Normal proximal ascending aorta Pericardium: Normal IMPRESSION: 1. 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 LGE is less than 50% transmural in mid anterior wall, suggesting viability. LGE is greater than 50% transmural in apical anterior wall and apex, suggesting nonviability in distal LAD territory. 2. Normal LV size, mild hypertrophy, and normal global systolic function (EF 56%). Apical anterior akinesis. Small apical aneurysm. 3.  Normal RV size and systolic function (EF 55%) Electronically Signed   By: Epifanio Lesches M.D.   On: 01/09/2022 21:58    Patient Profile     Colin Pena is a pleasant 66yo man with hx fo HTN who presented to OSH with new onset  fatigue, presyncope and shortness of breath. He tells me he has been experiencing right neck pain. Strong family history of CAD  Assessment & Plan    Advanced AV block  S/p Abbott DDD PPM 7/25 with Dr. Lalla Brothers CXR and device interrogation stable.  Wound care and arm restrictions reviewed with patient.    cMRI showed: 1. 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 LGE is less than 50% transmural in mid anterior wall, suggesting viability. LGE is greater than 50% transmural in apical anterior wall and apex, suggesting nonviability in distal LAD territory. 2. Normal LV size, mild hypertrophy, and normal global systolic function (EF 56%). Apical anterior akinesis. Small apical aneurysm. 3.  Normal RV size and systolic function (EF 55%)   2. CAD Discussed with Dr. [place] and Dr. Okey Dupre Pt will require complex PCI, potentially eventual CTO.   Pt will need 1-2 week follow up with internationalist to discuss best way to move forward.  Per discussions, would wait at least 2-3 weeks prior to PCI to reduce risk of bleeding from PPM site with heparin/DAPT  EP will see as needed while remains here.  Usual device follow up in place.  For questions or updates, please contact [facility] Please consult [email or web address] for contact info under Cardiology/STEMI.  Signed, Graciella Freer, PA-C  01/11/2022, 7:16 AM

## 2022-01-11 NOTE — Progress Notes (Signed)
Pt safely discharged. Discharge packet provided with teach-back method. VS as per flow. IV and telemetry removed, Pt reeducated on PPM site care & literature provided. Pt verbalized understanding. All questions and concerns addressed. Discharge lounge appropriate.

## 2022-01-11 NOTE — Progress Notes (Signed)
Ambulated Pt in the hallway. HR sustained in the 90s. Pt did have a brief increase to 120 with exertion. He was talking very fast as well as increasing his pace. HR returned to the 90s immediately after. Pt voiced that he felt great with no shortness of breath or dizziness. Team aware. Site C/D/I with steri strips present. Sling removed by MD. Call bell placed within reach. Will continue to monitor and maintain safety.

## 2022-01-13 ENCOUNTER — Telehealth: Payer: Self-pay

## 2022-01-13 NOTE — Telephone Encounter (Signed)
Called patient left message on personal voice mail appointment scheduled with Dr.Jordan 8/8 at 11:30 am at [facility] office to discuss CAD options.

## 2022-01-22 NOTE — Progress Notes (Signed)
Cardiology Office Note   Date:  01/24/2022   ID:  Colin, Pena Mar 05, 1956, MRN [id number]  PCP:  Horald Pollen, MD  Cardiologist:  Kirk Ruths MD   Chief Complaint  Patient presents with   Coronary Artery Disease      History of Present Illness: Colin Pena is a 66 y.o. male who is seen for interventional assessment of his CAD. He presented on 01/06/22 with intermittent symptoms of near syncope, acute dyspnea and fatigue. He was found to be in complete heart block. Echo showed apical akinesis/dyskinesis with EF 55%. Due to wall motion abnormality  cardiac cath was performed showing CTO of the mid LAD. There was moderate distal left main disease of 65%. The ostial LCx had 90% stenosis. There was some distal vessel disease as well. The PDA had an 85% lesion in the mid vessel. MRI was done for viability showing EF 56%. There was late gadolinium enhancement in the myocardium suggesting substantial viability in the anterior wall. He was seen in consultation with Dr Prescott Gum who felt that the LAD was not graftable. Consideration given to treating the non LAD disease with PCI. The patient underwent placement of dual chamber pacemaker on 01/10/22. He is now seen to consider possible percutaneous treatment of his CAD.   He reports since his pacemaker he has done well. No recurrent dizziness and SOB is much better. Still has a little SOB at times. No chest pain. Has a tingling sensation in his right shoulder to face.     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  01/11/2022    Past Surgical History:  Procedure Laterality Date   LEFT HEART CATH AND CORONARY ANGIOGRAPHY N/A 01/06/2022   Procedure: LEFT HEART CATH AND CORONARY ANGIOGRAPHY;  Surgeon: Nelva Bush, MD;  Location: [facility];  Service: Cardiovascular;   Laterality: N/A;   PACEMAKER IMPLANT N/A 01/10/2022   Procedure: PACEMAKER IMPLANT;  Surgeon: Vickie Epley, MD;  Location: [facility];  Service: Cardiovascular;  Laterality: N/A;     Current Outpatient Medications  Medication Sig Dispense Refill   acetaminophen (TYLENOL) 325 MG tablet Take 2 tablets (650 mg total) by mouth every 4 (four) hours as needed for headache or mild pain.     amLODipine (NORVASC) 10 MG tablet Take 1 tablet (10 mg total) by mouth daily. 90 tablet 3   aspirin 81 MG chewable tablet Chew 81 mg by mouth daily.     clopidogrel (PLAVIX) 75 MG tablet Take 1 tablet (75 mg total) by mouth daily. 90 tablet 3   metoprolol succinate (TOPROL-XL) 25 MG 24 hr tablet Take 1 tablet (25 mg total) by mouth daily. 30 tablet 6   nitroGLYCERIN (NITROSTAT) 0.4 MG SL tablet Place 1 tablet (0.4 mg total) under the tongue every 5 (five) minutes x 3 doses as needed for chest pain. 25 tablet 4   NONFORMULARY OR COMPOUNDED ITEM Triamcinolone 0.1%/Eucerin. Unknown ratio.1 application topically daily as directed. (Patient taking differently: Triamcinolone 0.1%/Eucerin. Unknown ratio.1 application topically daily as needed for eczema) 1 each 5   rosuvastatin (CRESTOR) 10 MG tablet Take 1 tablet (10 mg total) by mouth daily. 30 tablet 11   No current facility-administered medications for this visit.    Allergies:   Patient has no known allergies.  Social History:  The patient  reports that he quit smoking about 45 years ago. His smoking use included cigarettes. He has never used smokeless tobacco. He reports that he does not drink alcohol and does not use drugs.   Family History:  The patient's family history includes Cancer in his father, maternal aunt, maternal uncle, maternal uncle, paternal uncle, and paternal uncle; Coronary artery disease in his father; Heart attack in his sister; Heart disease in his father, mother, and paternal uncle; Heart failure in his mother; Hypertension  in his sister.    ROS:  Please see the history of present illness.   Otherwise, review of systems are positive for none.   All other systems are reviewed and negative.    PHYSICAL EXAM: VS:  BP (!) 158/84   Pulse 72   Ht 5' 8.5" (1.74 m)   Wt 194 lb 9.6 oz (88.3 kg)   SpO2 98%   BMI 29.16 kg/m  , BMI Body mass index is 29.16 kg/m. GEN: Well nourished, well developed, in no acute distress HEENT: normal Neck: no JVD, carotid bruits, or masses Cardiac: RRR; no murmurs, rubs, or gallops,no edema. Pacer site is healing well. Respiratory:  clear to auscultation bilaterally, normal work of breathing GI: soft, nontender, nondistended, + BS MS: no deformity or atrophy Skin: warm and dry, no rash Neuro:  Strength and sensation are intact Psych: euthymic mood, full affect   EKG:  EKG is not ordered today. The ekg ordered today demonstrates N/A   Recent Labs: 01/05/2022: ALT 48 01/06/2022: TSH 1.540 01/07/2022: Hemoglobin 13.2; Platelets 178 01/09/2022: BUN 16; Creatinine, Ser 1.27; Potassium 3.9; Sodium 139    Lipid Panel    Component Value Date/Time   CHOL 139 01/07/2022 0215   TRIG 145 01/07/2022 0215   HDL 25 (L) 01/07/2022 0215   CHOLHDL 5.6 01/07/2022 0215   VLDL 29 01/07/2022 0215   LDLCALC 85 01/07/2022 0215      Wt Readings from Last 3 Encounters:  01/24/22 194 lb 9.6 oz (88.3 kg)  01/11/22 188 lb 4.4 oz (85.4 kg)  10/18/21 198 lb (89.8 kg)      Other studies Reviewed: Additional studies/ records that were reviewed today include:  IMPRESSIONS     1. Patient in complete heart block during study Small area of apical  akinesis /dyskinesis . Left ventricular ejection fraction, by estimation,  is 55%. The left ventricle has normal function. The left ventricle  demonstrates regional wall motion  abnormalities (see scoring diagram/findings for description). There is  mild left ventricular hypertrophy. Left ventricular diastolic parameters  are indeterminate.    2. Right ventricular systolic function is normal. The right ventricular  size is normal.   3. The mitral valve is normal in structure. No evidence of mitral valve  regurgitation. No evidence of mitral stenosis.   4. The aortic valve is tricuspid. There is mild calcification of the  aortic valve. Aortic valve regurgitation is not visualized. Aortic valve  sclerosis is present, with no evidence of aortic valve stenosis.   5. The inferior vena cava is normal in size with greater than 50%  respiratory variability, suggesting right atrial pressure of 3 mmHg.   Cardiac cath:  LEFT HEART CATH AND CORONARY ANGIOGRAPHY   Conclusion  Conclusions: Severe three-vessel coronary artery disease, as detailed below. Mildly elevated left ventricular filling pressure.   Recommendations: Cardiac MRI for myocardial viability with attention on LAD territory. Cardiac surgery consultation and Heart Team evaluation to discuss optimal revascularization  strategy. Follow-up EP recommendations. Aggressive secondary prevention of coronary artery disease.   [name], MD [facility]  Coronary Diagrams  Diagnostic Dominance: Right  Intervention   Cardiac MRI: CLINICAL DATA:  70M with HTN p/w 2:1 AV block, intermittent complete heart block. Echo 01/06/22 with EF 55% (apical akinesis/dyskinesis), normal RV function, no significant valvular disease. Cath 7/21 showed CTO mid LAD, 65% distal LM, 80% D1, 99% mid to distal LCX, 50% mid RCA, 85% RPDA. CMR to evaluate viability.   EXAM: CARDIAC MRI   TECHNIQUE: The patient was scanned on a 1.5 Tesla Siemens magnet. A dedicated cardiac coil was used. Functional imaging was done using Fiesta sequences. 2,3, and 4 chamber views were done to assess for RWMA's. Modified Simpson's rule using a short axis stack was used to calculate an ejection fraction on a dedicated work [employer]. The patient received 10 cc of Gadavist. After  10 minutes inversion recovery sequences were used to assess for infiltration and scar tissue.   CONTRAST:  10 cc  of Gadavist   FINDINGS: Left ventricle:   -Mild hypertrophy   -Normal size   -Normal global systolic function. Apical anterior akinesis. Small apical aneurysm   -Normal ECV (27%)   -Normal T2 values   -Subendocardial LGE consistent with prior infarct in mid to apical anterior wall and apex. LGE<50% transmural in mid anterior wall, suggesting viability. LGE >50% transmural in apical anterior wall and apex, suggesting nonviability in distal LAD territory.   LV EF: 56% (Normal 56-78%)   Absolute volumes:   LV EDV: (Normal 77-195 mL)   LV ESV: 4mL (Normal 19-72 mL)   LV SV: 48mL (Normal 51-133 mL)   CO: 3.8L/min (Normal 2.8-8.8 L/min)   Indexed volumes:   LV EDV: 39mL/sq-m (Normal 47-92 mL/sq-m)   LV ESV: 45mL/sq-m (Normal 13-30 mL/sq-m)   LV SV: 56mL/sq-m (Normal 32-62 mL/sq-m)   CI: 1.9L/min/sq-m (Normal 1.7-4.2 L/min/sq-m)   Right ventricle: Normal size and systolic function   RV EF:  55% (Normal 47-74%)   Absolute volumes:   RV EDV: (Normal 88-227 mL)   RV ESV: 85mL (Normal 23-103 mL)   RV SV: 49mL (Normal 52-138 mL)   CO: 3.9L/min (Normal 2.8-8.8 L/min)   Indexed volumes:   RV EDV: 57mL/sq-m (Normal 55-105 mL/sq-m)   RV ESV: 84mL/sq-m (Normal 15-43 mL/sq-m)   RV SV: 12mL/sq-m (Normal 32-64 mL/sq-m)   CI: 1.9L/min/sq-m (Normal 1.7-4.2 L/min/sq-m)   Left atrium: Normal size   Right atrium: Normal size   Mitral valve: Trivial regurgitation   Aortic valve: Tricuspid. No regurgitation   Tricuspid valve: Trivial regurgitation   Pulmonic valve: No regurgitation   Aorta: Normal proximal ascending aorta   Pericardium: Normal   IMPRESSION: 1. 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 LGE is less than 50% transmural in mid anterior wall,  suggesting viability. LGE is greater than 50% transmural in apical anterior wall and apex, suggesting nonviability in distal LAD territory.   2. Normal LV size, mild hypertrophy, and normal global systolic function (EF 56%). Apical anterior akinesis. Small apical aneurysm.   3.  Normal RV size and systolic function (EF 55%)     Electronically Signed   By: Epifanio Lesches M.D.   On: 01/09/2022 21:58   ASSESSMENT AND PLAN:  1.  Complete heart block s/p pacemaker insertion. Pacer site is healing well. Follow up with EP  2. CAD. Complex CAD with left main  stenosis. Mid LAD occlusion, tight ostial LCx. Distal LCX and PDA disease. Really only minimal symptoms of dyspnea. No chest pain. We reviewed his case at our Heart team meeting. Given the fact that the LAD is likely not graftable  we felt that treatment of the left main into the LCx would be reasonable. This likely will need atherectomy. Will use IVUS pre intervention to guide therapy. Hopefully avoid true bifurcation stenting into the LAD but open struts into the LAD to maintain access. Also discussed the indication for CTO PCI of the LAD would be refractory symptoms despite medical therapy and this could be done in a separate setting. I think management of PDA and distal LCx disease is medication. I discussed all this in detail with the patient. Reviewed options of medication and surgical therapy. Reviewed risks of PCI. He understands and is agreeable. Will plan to proceed on August 22nd. Will initiate plavix 75 mg daily The procedure and risks were reviewed including but not limited to death, myocardial infarction, stroke, arrythmias, bleeding, transfusion, emergency surgery, dye allergy, or renal dysfunction. The patient voices understanding and is agreeable to proceed.  3.  HTN. On Toprol XL with suboptimal control. Add back Amlodipine 10 mg daily  4. Hypercholesterolemia. Now on Crestor. Goal LDL <70.    Current medicines are  reviewed at length with the patient today.  The patient does not have concerns regarding medicines.  The following changes have been made:  resume amlodipine 10 mg daily. Start Plavix 75 mg daily  Labs/ tests ordered today include:   Orders Placed This Encounter  Procedures   Basic metabolic panel   CBC w/Diff/Platelet   PT and PTT         Disposition:   plan PCI on August 22.   Signed, Jazsmin Couse Martinique, MD  01/24/2022 11:57 AM    [facility] [address] [place], [place], [address] Phone [phone], Fax [phone]

## 2022-01-22 NOTE — H&P (View-Only) (Signed)
Cardiology Office Note   Date:  01/24/2022   ID:  [name], [name] [birth year], MRN [id number]  PCP:  Horald Pollen, MD  Cardiologist:  Kirk Ruths MD   Chief Complaint  Patient presents with   Coronary Artery Disease      History of Present Illness: Colin Pena is a 66 y.o. male who is seen for interventional assessment of his CAD. He presented on 01/06/22 with intermittent symptoms of near syncope, acute dyspnea and fatigue. He was found to be in complete heart block. Echo showed apical akinesis/dyskinesis with EF 55%. Due to wall motion abnormality  cardiac cath was performed showing CTO of the mid LAD. There was moderate distal left main disease of 65%. The ostial LCx had 90% stenosis. There was some distal vessel disease as well. The PDA had an 85% lesion in the mid vessel. MRI was done for viability showing EF 56%. There was late gadolinium enhancement in the myocardium suggesting substantial viability in the anterior wall. He was seen in consultation with Dr Prescott Gum who felt that the LAD was not graftable. Consideration given to treating the non LAD disease with PCI. The patient underwent placement of dual chamber pacemaker on 01/10/22. He is now seen to consider possible percutaneous treatment of his CAD.   He reports since his pacemaker he has done well. No recurrent dizziness and SOB is much better. Still has a little SOB at times. No chest pain. Has a tingling sensation in his right shoulder to face.     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  01/11/2022    Past Surgical History:  Procedure Laterality Date   LEFT HEART CATH AND CORONARY ANGIOGRAPHY N/A 01/06/2022   Procedure: LEFT HEART CATH AND CORONARY ANGIOGRAPHY;  Surgeon: Nelva Bush, MD;  Location: [facility];  Service: Cardiovascular;   Laterality: N/A;   PACEMAKER IMPLANT N/A 01/10/2022   Procedure: PACEMAKER IMPLANT;  Surgeon: Vickie Epley, MD;  Location: [facility];  Service: Cardiovascular;  Laterality: N/A;     Current Outpatient Medications  Medication Sig Dispense Refill   acetaminophen (TYLENOL) 325 MG tablet Take 2 tablets (650 mg total) by mouth every 4 (four) hours as needed for headache or mild pain.     amLODipine (NORVASC) 10 MG tablet Take 1 tablet (10 mg total) by mouth daily. 90 tablet 3   aspirin 81 MG chewable tablet Chew 81 mg by mouth daily.     clopidogrel (PLAVIX) 75 MG tablet Take 1 tablet (75 mg total) by mouth daily. 90 tablet 3   metoprolol succinate (TOPROL-XL) 25 MG 24 hr tablet Take 1 tablet (25 mg total) by mouth daily. 30 tablet 6   nitroGLYCERIN (NITROSTAT) 0.4 MG SL tablet Place 1 tablet (0.4 mg total) under the tongue every 5 (five) minutes x 3 doses as needed for chest pain. 25 tablet 4   NONFORMULARY OR COMPOUNDED ITEM Triamcinolone 0.1%/Eucerin. Unknown ratio.1 application topically daily as directed. (Patient taking differently: Triamcinolone 0.1%/Eucerin. Unknown ratio.1 application topically daily as needed for eczema) 1 each 5   rosuvastatin (CRESTOR) 10 MG tablet Take 1 tablet (10 mg total) by mouth daily. 30 tablet 11   No current facility-administered medications for this visit.    Allergies:   Patient has no known allergies.  Social History:  The patient  reports that he quit smoking about 45 years ago. His smoking use included cigarettes. He has never used smokeless tobacco. He reports that he does not drink alcohol and does not use drugs.   Family History:  The patient's family history includes Cancer in his father, maternal aunt, maternal uncle, maternal uncle, paternal uncle, and paternal uncle; Coronary artery disease in his father; Heart attack in his sister; Heart disease in his father, mother, and paternal uncle; Heart failure in his mother; Hypertension  in his sister.    ROS:  Please see the history of present illness.   Otherwise, review of systems are positive for none.   All other systems are reviewed and negative.    PHYSICAL EXAM: VS:  BP (!) 158/84   Pulse 72   Ht 5' 8.5" (1.74 m)   Wt 194 lb 9.6 oz (88.3 kg)   SpO2 98%   BMI 29.16 kg/m  , BMI Body mass index is 29.16 kg/m. GEN: Well nourished, well developed, in no acute distress HEENT: normal Neck: no JVD, carotid bruits, or masses Cardiac: RRR; no murmurs, rubs, or gallops,no edema. Pacer site is healing well. Respiratory:  clear to auscultation bilaterally, normal work of breathing GI: soft, nontender, nondistended, + BS MS: no deformity or atrophy Skin: warm and dry, no rash Neuro:  Strength and sensation are intact Psych: euthymic mood, full affect   EKG:  EKG is not ordered today. The ekg ordered today demonstrates N/A   Recent Labs: 01/05/2022: ALT 48 01/06/2022: TSH 1.540 01/07/2022: Hemoglobin 13.2; Platelets 178 01/09/2022: BUN 16; Creatinine, Ser 1.27; Potassium 3.9; Sodium 139    Lipid Panel    Component Value Date/Time   CHOL 139 01/07/2022 0215   TRIG 145 01/07/2022 0215   HDL 25 (L) 01/07/2022 0215   CHOLHDL 5.6 01/07/2022 0215   VLDL 29 01/07/2022 0215   LDLCALC 85 01/07/2022 0215      Wt Readings from Last 3 Encounters:  01/24/22 194 lb 9.6 oz (88.3 kg)  01/11/22 188 lb 4.4 oz (85.4 kg)  10/18/21 198 lb (89.8 kg)      Other studies Reviewed: Additional studies/ records that were reviewed today include:  IMPRESSIONS     1. Patient in complete heart block during study Small area of apical  akinesis /dyskinesis . Left ventricular ejection fraction, by estimation,  is 55%. The left ventricle has normal function. The left ventricle  demonstrates regional wall motion  abnormalities (see scoring diagram/findings for description). There is  mild left ventricular hypertrophy. Left ventricular diastolic parameters  are indeterminate.    2. Right ventricular systolic function is normal. The right ventricular  size is normal.   3. The mitral valve is normal in structure. No evidence of mitral valve  regurgitation. No evidence of mitral stenosis.   4. The aortic valve is tricuspid. There is mild calcification of the  aortic valve. Aortic valve regurgitation is not visualized. Aortic valve  sclerosis is present, with no evidence of aortic valve stenosis.   5. The inferior vena cava is normal in size with greater than 50%  respiratory variability, suggesting right atrial pressure of 3 mmHg.   Cardiac cath:  LEFT HEART CATH AND CORONARY ANGIOGRAPHY   Conclusion  Conclusions: Severe three-vessel coronary artery disease, as detailed below. Mildly elevated left ventricular filling pressure.   Recommendations: Cardiac MRI for myocardial viability with attention on LAD territory. Cardiac surgery consultation and Heart Team evaluation to discuss optimal revascularization  strategy. Follow-up EP recommendations. Aggressive secondary prevention of coronary artery disease.   [name], MD [facility]  Coronary Diagrams  Diagnostic Dominance: Right  Intervention   Cardiac MRI: CLINICAL DATA:  70M with HTN p/w 2:1 AV block, intermittent complete heart block. Echo 01/06/22 with EF 55% (apical akinesis/dyskinesis), normal RV function, no significant valvular disease. Cath 7/21 showed CTO mid LAD, 65% distal LM, 80% D1, 99% mid to distal LCX, 50% mid RCA, 85% RPDA. CMR to evaluate viability.   EXAM: CARDIAC MRI   TECHNIQUE: The patient was scanned on a 1.5 Tesla Siemens magnet. A dedicated cardiac coil was used. Functional imaging was done using Fiesta sequences. 2,3, and 4 chamber views were done to assess for RWMA's. Modified Simpson's rule using a short axis stack was used to calculate an ejection fraction on a dedicated work [employer]. The patient received 10 cc of Gadavist. After  10 minutes inversion recovery sequences were used to assess for infiltration and scar tissue.   CONTRAST:  10 cc  of Gadavist   FINDINGS: Left ventricle:   -Mild hypertrophy   -Normal size   -Normal global systolic function. Apical anterior akinesis. Small apical aneurysm   -Normal ECV (27%)   -Normal T2 values   -Subendocardial LGE consistent with prior infarct in mid to apical anterior wall and apex. LGE<50% transmural in mid anterior wall, suggesting viability. LGE >50% transmural in apical anterior wall and apex, suggesting nonviability in distal LAD territory.   LV EF: 56% (Normal 56-78%)   Absolute volumes:   LV EDV: (Normal 77-195 mL)   LV ESV: 4mL (Normal 19-72 mL)   LV SV: 48mL (Normal 51-133 mL)   CO: 3.8L/min (Normal 2.8-8.8 L/min)   Indexed volumes:   LV EDV: 39mL/sq-m (Normal 47-92 mL/sq-m)   LV ESV: 45mL/sq-m (Normal 13-30 mL/sq-m)   LV SV: 56mL/sq-m (Normal 32-62 mL/sq-m)   CI: 1.9L/min/sq-m (Normal 1.7-4.2 L/min/sq-m)   Right ventricle: Normal size and systolic function   RV EF:  55% (Normal 47-74%)   Absolute volumes:   RV EDV: (Normal 88-227 mL)   RV ESV: 85mL (Normal 23-103 mL)   RV SV: 49mL (Normal 52-138 mL)   CO: 3.9L/min (Normal 2.8-8.8 L/min)   Indexed volumes:   RV EDV: 57mL/sq-m (Normal 55-105 mL/sq-m)   RV ESV: 84mL/sq-m (Normal 15-43 mL/sq-m)   RV SV: 12mL/sq-m (Normal 32-64 mL/sq-m)   CI: 1.9L/min/sq-m (Normal 1.7-4.2 L/min/sq-m)   Left atrium: Normal size   Right atrium: Normal size   Mitral valve: Trivial regurgitation   Aortic valve: Tricuspid. No regurgitation   Tricuspid valve: Trivial regurgitation   Pulmonic valve: No regurgitation   Aorta: Normal proximal ascending aorta   Pericardium: Normal   IMPRESSION: 1. 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 LGE is less than 50% transmural in mid anterior wall,  suggesting viability. LGE is greater than 50% transmural in apical anterior wall and apex, suggesting nonviability in distal LAD territory.   2. Normal LV size, mild hypertrophy, and normal global systolic function (EF 56%). Apical anterior akinesis. Small apical aneurysm.   3.  Normal RV size and systolic function (EF 55%)     Electronically Signed   By: Epifanio Lesches M.D.   On: 01/09/2022 21:58   ASSESSMENT AND PLAN:  1.  Complete heart block s/p pacemaker insertion. Pacer site is healing well. Follow up with EP  2. CAD. Complex CAD with left main  stenosis. Mid LAD occlusion, tight ostial LCx. Distal LCX and PDA disease. Really only minimal symptoms of dyspnea. No chest pain. We reviewed his case at our Heart team meeting. Given the fact that the LAD is likely not graftable  we felt that treatment of the left main into the LCx would be reasonable. This likely will need atherectomy. Will use IVUS pre intervention to guide therapy. Hopefully avoid true bifurcation stenting into the LAD but open struts into the LAD to maintain access. Also discussed the indication for CTO PCI of the LAD would be refractory symptoms despite medical therapy and this could be done in a separate setting. I think management of PDA and distal LCx disease is medication. I discussed all this in detail with the patient. Reviewed options of medication and surgical therapy. Reviewed risks of PCI. He understands and is agreeable. Will plan to proceed on August 22nd. Will initiate plavix 75 mg daily The procedure and risks were reviewed including but not limited to death, myocardial infarction, stroke, arrythmias, bleeding, transfusion, emergency surgery, dye allergy, or renal dysfunction. The patient voices understanding and is agreeable to proceed.  3.  HTN. On Toprol XL with suboptimal control. Add back Amlodipine 10 mg daily  4. Hypercholesterolemia. Now on Crestor. Goal LDL <70.    Current medicines are  reviewed at length with the patient today.  The patient does not have concerns regarding medicines.  The following changes have been made:  resume amlodipine 10 mg daily. Start Plavix 75 mg daily  Labs/ tests ordered today include:   Orders Placed This Encounter  Procedures   Basic metabolic panel   CBC w/Diff/Platelet   PT and PTT         Disposition:   plan PCI on August 22.   Signed, Geneen Dieter Martinique, MD  01/24/2022 11:57 AM    [facility] [address] [place], [place], [address] Phone [phone], Fax [phone]

## 2022-01-24 ENCOUNTER — Other Ambulatory Visit: Payer: Self-pay | Admitting: Cardiology

## 2022-01-24 ENCOUNTER — Ambulatory Visit (INDEPENDENT_AMBULATORY_CARE_PROVIDER_SITE_OTHER): Payer: Self-pay | Admitting: Cardiology

## 2022-01-24 ENCOUNTER — Encounter: Payer: Self-pay | Admitting: Cardiology

## 2022-01-24 VITALS — BP 158/84 | HR 72 | Ht 68.5 in | Wt 194.6 lb

## 2022-01-24 DIAGNOSIS — Z01812 Encounter for preprocedural laboratory examination: Secondary | ICD-10-CM

## 2022-01-24 DIAGNOSIS — E7800 Pure hypercholesterolemia, unspecified: Secondary | ICD-10-CM

## 2022-01-24 DIAGNOSIS — I2510 Atherosclerotic heart disease of native coronary artery without angina pectoris: Secondary | ICD-10-CM

## 2022-01-24 DIAGNOSIS — I25118 Atherosclerotic heart disease of native coronary artery with other forms of angina pectoris: Secondary | ICD-10-CM

## 2022-01-24 DIAGNOSIS — I442 Atrioventricular block, complete: Secondary | ICD-10-CM

## 2022-01-24 DIAGNOSIS — I10 Essential (primary) hypertension: Secondary | ICD-10-CM

## 2022-01-24 MED ORDER — CLOPIDOGREL BISULFATE 75 MG PO TABS: 75.0000 mg | ORAL_TABLET | Freq: Every day | ORAL | 3 refills | Status: DC

## 2022-01-24 MED ORDER — AMLODIPINE BESYLATE 10 MG PO TABS: 10.0000 mg | ORAL_TABLET | Freq: Every day | ORAL | 3 refills | Status: DC

## 2022-01-24 MED ORDER — SODIUM CHLORIDE 0.9% FLUSH: 3.0000 mL | Freq: Two times a day (BID) | INTRAVENOUS | Status: DC

## 2022-01-24 NOTE — Addendum Note (Signed)
Addended by: Neoma Laming on: 01/24/2022 12:08 PM   Modules accepted: Orders

## 2022-01-24 NOTE — Patient Instructions (Signed)
Resume amlodipine 10 mg daily  Start Plavix 75 mg daily.       [facility] CARDIOVASCULAR DIVISION [facility] NORTHLINE [address] [place] [address] Dept: [phone] Loc: [phone]  Colin Pena  01/24/2022  You are scheduled for a Cardiac Catheterization on Tuesday, August 22 with Dr. Peter Swaziland.  1. Please arrive at the Main Entrance A at [facility]: [address] [place], [place] [address] at 5:30 AM (This time is two hours before your procedure to ensure your preparation). Free valet parking service is available.   Special note: Every effort is made to have your procedure done on time. Please understand that emergencies sometimes delay scheduled procedures.  2. Diet: Do not eat solid foods after midnight.  You may have clear liquids until 5 AM upon the day of the procedure.  3. Labs: You will need to have blood drawn on , Tuesday 8/15 Lab order enclosed You do not need to be fasting.  4. Medication instructions in preparation for your procedure:        On the morning of your procedure, take Aspirin and Plavix/Clopidogrel and any morning medicines NOT listed above.  You may use sips of water.  5. Plan to go home the same day, you will only stay overnight if medically necessary. 6. You MUST have a responsible adult to drive you home. 7. An adult MUST be with you the first 24 hours after you arrive home. 8. Bring a current list of your medications, and the last time and date medication taken. 9. Bring ID and current insurance cards. 10.Please wear clothes that are easy to get on and off and wear slip-on shoes.  Thank you for allowing [place] to care for you!   --  Invasive Cardiovascular services

## 2022-01-25 ENCOUNTER — Ambulatory Visit (INDEPENDENT_AMBULATORY_CARE_PROVIDER_SITE_OTHER): Payer: Self-pay

## 2022-01-25 DIAGNOSIS — I441 Atrioventricular block, second degree: Secondary | ICD-10-CM

## 2022-01-25 NOTE — Patient Instructions (Addendum)
   After Your Pacemaker   Monitor your pacemaker site for redness, swelling, and drainage. Call the device clinic at [phone] if you experience these symptoms or fever/chills.  Your incision was closed with Steri-strips or staples:  You may shower 7 days after your procedure and wash your incision with soap and water. Avoid lotions, ointments, or perfumes over your incision until it is well-healed.   Do not lift, push or pull greater than 10 pounds with the affected arm until  February 22 2022. There are no other restrictions in arm movement after your wound check appointment.  You may drive, unless driving has been restricted by your healthcare providers.   Remote monitoring is used to monitor your pacemaker from home. This monitoring is scheduled every 91 days by our office. It allows [place] to keep an eye on the functioning of your device to ensure it is working properly. You will routinely see your Electrophysiologist annually (more often if necessary).

## 2022-01-25 NOTE — Progress Notes (Signed)
Wound check appointment. Steri-strips fell off prior to appointment. Wound without redness or edema. Incision edges approximated, wound well healed. Normal device function. Thresholds, sensing, and impedances consistent with implant measurements. Device programmed at 3.5V/auto capture programmed on for extra safety margin until 3 month visit. Histogram distribution appropriate for patient and level of activity. No mode switches or high ventricular rates noted. Patient educated about wound care, arm mobility, lifting restrictions. ROV in 3 months with implanting physician.

## 2022-02-01 ENCOUNTER — Ambulatory Visit: Payer: Self-pay

## 2022-02-01 LAB — CBC WITH DIFFERENTIAL/PLATELET
Basophils Absolute: 0.1 10*3/uL (ref 0.0–0.2)
Basos: 1 %
EOS (ABSOLUTE): 0.1 10*3/uL (ref 0.0–0.4)
Eos: 2 %
Hematocrit: 41.3 % (ref 37.5–51.0)
Hemoglobin: 14.3 g/dL (ref 13.0–17.7)
Immature Grans (Abs): 0 10*3/uL (ref 0.0–0.1)
Immature Granulocytes: 0 %
Lymphocytes Absolute: 0.8 10*3/uL (ref 0.7–3.1)
Lymphs: 18 %
MCH: 30.2 pg (ref 26.6–33.0)
MCHC: 34.6 g/dL (ref 31.5–35.7)
MCV: 87 fL (ref 79–97)
Monocytes Absolute: 0.3 10*3/uL (ref 0.1–0.9)
Monocytes: 7 %
Neutrophils Absolute: 3.3 10*3/uL (ref 1.4–7.0)
Neutrophils: 72 %
Platelets: 167 10*3/uL (ref 150–450)
RBC: 4.73 x10E6/uL (ref 4.14–5.80)
RDW: 12.7 % (ref 11.6–15.4)
WBC: 4.6 10*3/uL (ref 3.4–10.8)

## 2022-02-01 LAB — BASIC METABOLIC PANEL
BUN/Creatinine Ratio: 16 (ref 10–24)
BUN: 16 mg/dL (ref 8–27)
CO2: 22 mmol/L (ref 20–29)
Calcium: 9.3 mg/dL (ref 8.6–10.2)
Chloride: 106 mmol/L (ref 96–106)
Creatinine, Ser: 1.03 mg/dL (ref 0.76–1.27)
Glucose: 95 mg/dL (ref 70–99)
Potassium: 4.4 mmol/L (ref 3.5–5.2)
Sodium: 142 mmol/L (ref 134–144)
eGFR: 81 mL/min/{1.73_m2} (ref >59)

## 2022-02-01 LAB — PT AND PTT
INR: 1.1 (ref 0.9–1.2)
Prothrombin Time: 11.4 s (ref 9.1–12.0)
aPTT: 30 s (ref 24–33)

## 2022-02-06 ENCOUNTER — Telehealth: Payer: Self-pay | Admitting: *Deleted

## 2022-02-06 NOTE — Telephone Encounter (Signed)
Pt returning call regarding Procedure Instructions. Please advise

## 2022-02-06 NOTE — Telephone Encounter (Signed)
Voicemail message, no answer. 

## 2022-02-06 NOTE — Telephone Encounter (Signed)
Patient is returning call to discuss instructions for procedure. Please return call to patient's mobile line at [phone].

## 2022-02-06 NOTE — Telephone Encounter (Signed)
Coronary Stent scheduled at [facility] for: Tuesday February 07, 2022 7:30 AM Arrival time and place: [facility] Main Entrance A at: 5:30 AM   Nothing to eat after midnight prior to procedure, clear liquids until 5 AM day of procedure.  Medication instructions: -Usual morning medications can be taken with sips of water including aspirin 81 mg and Plavix 75 mg.  Confirmed patient has responsible adult to drive home post procedure and be with patient first 24 hours after arriving home.  Patient reports no new symptoms concerning for COVID-19 in the past 10 days.  Left message for patient to call back to review procedure instructions.

## 2022-02-06 NOTE — Telephone Encounter (Signed)
Reviewed procedure instructions with patient.  

## 2022-02-07 ENCOUNTER — Ambulatory Visit (HOSPITAL_COMMUNITY)
Admission: RE | Admit: 2022-02-07 | Discharge: 2022-02-08 | Disposition: A | Discharge status: Home / Self Care | Payer: Self-pay | Attending: Cardiology | Admitting: Cardiology

## 2022-02-07 ENCOUNTER — Other Ambulatory Visit: Payer: Self-pay

## 2022-02-07 ENCOUNTER — Encounter (HOSPITAL_COMMUNITY)
Admission: RE | Disposition: A | Discharge status: Home / Self Care | Payer: Self-pay | Source: Home / Self Care | Attending: Cardiology

## 2022-02-07 ENCOUNTER — Encounter (HOSPITAL_COMMUNITY): Payer: Self-pay | Admitting: Cardiology

## 2022-02-07 DIAGNOSIS — Z79899 Other long term (current) drug therapy: Secondary | ICD-10-CM | POA: Insufficient documentation

## 2022-02-07 DIAGNOSIS — I209 Angina pectoris, unspecified: Secondary | ICD-10-CM | POA: Diagnosis present

## 2022-02-07 DIAGNOSIS — E785 Hyperlipidemia, unspecified: Secondary | ICD-10-CM

## 2022-02-07 DIAGNOSIS — Z7902 Long term (current) use of antithrombotics/antiplatelets: Secondary | ICD-10-CM | POA: Insufficient documentation

## 2022-02-07 DIAGNOSIS — I442 Atrioventricular block, complete: Secondary | ICD-10-CM | POA: Diagnosis present

## 2022-02-07 DIAGNOSIS — Z955 Presence of coronary angioplasty implant and graft: Secondary | ICD-10-CM | POA: Insufficient documentation

## 2022-02-07 DIAGNOSIS — I25118 Atherosclerotic heart disease of native coronary artery with other forms of angina pectoris: Secondary | ICD-10-CM

## 2022-02-07 DIAGNOSIS — I2584 Coronary atherosclerosis due to calcified coronary lesion: Secondary | ICD-10-CM | POA: Insufficient documentation

## 2022-02-07 DIAGNOSIS — Z7982 Long term (current) use of aspirin: Secondary | ICD-10-CM | POA: Insufficient documentation

## 2022-02-07 DIAGNOSIS — I10 Essential (primary) hypertension: Secondary | ICD-10-CM

## 2022-02-07 DIAGNOSIS — I2510 Atherosclerotic heart disease of native coronary artery without angina pectoris: Secondary | ICD-10-CM | POA: Diagnosis present

## 2022-02-07 DIAGNOSIS — Z950 Presence of cardiac pacemaker: Secondary | ICD-10-CM | POA: Diagnosis present

## 2022-02-07 DIAGNOSIS — I2582 Chronic total occlusion of coronary artery: Secondary | ICD-10-CM | POA: Insufficient documentation

## 2022-02-07 DIAGNOSIS — I25119 Atherosclerotic heart disease of native coronary artery with unspecified angina pectoris: Secondary | ICD-10-CM

## 2022-02-07 DIAGNOSIS — I441 Atrioventricular block, second degree: Secondary | ICD-10-CM | POA: Diagnosis present

## 2022-02-07 HISTORY — PX: CORONARY STENT INTERVENTION: CATH118234

## 2022-02-07 HISTORY — PX: CORONARY ATHERECTOMY: CATH118238

## 2022-02-07 HISTORY — PX: CORONARY ULTRASOUND/IVUS: CATH118244

## 2022-02-07 HISTORY — PX: INTRAVASCULAR ULTRASOUND/IVUS: CATH118244

## 2022-02-07 LAB — POCT ACTIVATED CLOTTING TIME
Activated Clotting Time: 269 seconds
Activated Clotting Time: 558 seconds
Activated Clotting Time: 263 seconds
Activated Clotting Time: 769 seconds

## 2022-02-07 SURGERY — CORONARY ATHERECTOMY
Anesthesia: LOCAL

## 2022-02-07 MED ORDER — HEPARIN SODIUM (PORCINE) 1000 UNIT/ML IJ SOLN
INTRAMUSCULAR | Status: DC | PRN
  Administered 2022-02-07: 2000 [IU] via INTRAVENOUS
  Administered 2022-02-07: 9000 [IU] via INTRAVENOUS
  Administered 2022-02-07: 2000 [IU] via INTRAVENOUS

## 2022-02-07 MED ORDER — SODIUM CHLORIDE 0.9 % IV SOLN: 250.0000 mL | INTRAVENOUS | Status: DC | PRN

## 2022-02-07 MED ORDER — SODIUM CHLORIDE 0.9% FLUSH: 3.0000 mL | INTRAVENOUS | Status: DC | PRN

## 2022-02-07 MED ORDER — FENTANYL CITRATE (PF) 100 MCG/2ML IJ SOLN
INTRAMUSCULAR | Status: DC | PRN
Start: 2022-02-07 — End: 2022-02-07
  Administered 2022-02-07: 50 ug via INTRAVENOUS
  Administered 2022-02-07: 25 ug via INTRAVENOUS

## 2022-02-07 MED ORDER — ASPIRIN 81 MG PO CHEW: 81.0000 mg | CHEWABLE_TABLET | ORAL | Status: DC

## 2022-02-07 MED ORDER — SODIUM CHLORIDE 0.9 % WEIGHT BASED INFUSION: 1.0000 mL/kg/h | INTRAVENOUS | Status: DC

## 2022-02-07 MED ORDER — LIDOCAINE HCL (PF) 1 % IJ SOLN
INTRAMUSCULAR | Status: DC | PRN
  Administered 2022-02-07: 2 mL

## 2022-02-07 MED ORDER — METOPROLOL SUCCINATE ER 25 MG PO TB24
25.0000 mg | ORAL_TABLET | Freq: Every day | ORAL | Status: DC
  Administered 2022-02-08: 25 mg via ORAL
  Filled 2022-02-07 (×2): qty 1

## 2022-02-07 MED ORDER — ONDANSETRON HCL 4 MG/2ML IJ SOLN
4.0000 mg | Freq: Four times a day (QID) | INTRAMUSCULAR | Status: DC | PRN
  Administered 2022-02-07 (×2): 4 mg via INTRAVENOUS
  Filled 2022-02-07: qty 2

## 2022-02-07 MED ORDER — HEPARIN SODIUM (PORCINE) 1000 UNIT/ML IJ SOLN
INTRAMUSCULAR | Status: AC
Start: 2022-02-07 — End: ?
  Filled 2022-02-07: qty 10

## 2022-02-07 MED ORDER — VERAPAMIL HCL 2.5 MG/ML IV SOLN
INTRAVENOUS | Status: DC | PRN
  Administered 2022-02-07: 10 mL via INTRA_ARTERIAL

## 2022-02-07 MED ORDER — ONDANSETRON HCL 4 MG/2ML IJ SOLN
INTRAMUSCULAR | Status: AC
Start: 2022-02-07 — End: ?
  Filled 2022-02-07: qty 2

## 2022-02-07 MED ORDER — VERAPAMIL HCL 2.5 MG/ML IV SOLN
INTRAVENOUS | Status: AC
  Filled 2022-02-07: qty 2

## 2022-02-07 MED ORDER — AMLODIPINE BESYLATE 10 MG PO TABS
10.0000 mg | ORAL_TABLET | Freq: Every day | ORAL | Status: DC
  Administered 2022-02-08: 10 mg via ORAL
  Filled 2022-02-07: qty 1

## 2022-02-07 MED ORDER — HEPARIN (PORCINE) IN NACL 1000-0.9 UT/500ML-% IV SOLN
INTRAVENOUS | Status: DC | PRN
  Administered 2022-02-07 (×2): 500 mL

## 2022-02-07 MED ORDER — MIDAZOLAM HCL 2 MG/2ML IJ SOLN
INTRAMUSCULAR | Status: AC
  Filled 2022-02-07: qty 2

## 2022-02-07 MED ORDER — MIDAZOLAM HCL 2 MG/2ML IJ SOLN
INTRAMUSCULAR | Status: DC | PRN
  Administered 2022-02-07: 2 mg via INTRAVENOUS
  Administered 2022-02-07: 1 mg via INTRAVENOUS

## 2022-02-07 MED ORDER — ROSUVASTATIN CALCIUM 5 MG PO TABS
10.0000 mg | ORAL_TABLET | Freq: Every day | ORAL | Status: DC
  Filled 2022-02-07: qty 2

## 2022-02-07 MED ORDER — HEPARIN (PORCINE) IN NACL 1000-0.9 UT/500ML-% IV SOLN
INTRAVENOUS | Status: AC
Start: 2022-02-07 — End: ?
  Filled 2022-02-07: qty 1000

## 2022-02-07 MED ORDER — SODIUM CHLORIDE 0.9% FLUSH
3.0000 mL | Freq: Two times a day (BID) | INTRAVENOUS | Status: DC
  Administered 2022-02-07: 3 mL via INTRAVENOUS

## 2022-02-07 MED ORDER — ACETAMINOPHEN 325 MG PO TABS: 650.0000 mg | ORAL_TABLET | ORAL | Status: DC | PRN

## 2022-02-07 MED ORDER — ASPIRIN 81 MG PO CHEW
81.0000 mg | CHEWABLE_TABLET | Freq: Every day | ORAL | Status: DC
  Administered 2022-02-08: 81 mg via ORAL
  Filled 2022-02-07: qty 1

## 2022-02-07 MED ORDER — LIDOCAINE HCL (PF) 1 % IJ SOLN
INTRAMUSCULAR | Status: AC
  Filled 2022-02-07: qty 30

## 2022-02-07 MED ORDER — HYDRALAZINE HCL 20 MG/ML IJ SOLN: 10.0000 mg | INTRAMUSCULAR | Status: AC | PRN

## 2022-02-07 MED ORDER — ASPIRIN 81 MG PO CHEW
81.0000 mg | CHEWABLE_TABLET | ORAL | Status: AC
  Administered 2022-02-07: 81 mg via ORAL

## 2022-02-07 MED ORDER — IOHEXOL 350 MG/ML SOLN
INTRAVENOUS | Status: DC | PRN
  Administered 2022-02-07: 90 mL

## 2022-02-07 MED ORDER — CLOPIDOGREL BISULFATE 75 MG PO TABS
75.0000 mg | ORAL_TABLET | Freq: Every day | ORAL | Status: DC
  Administered 2022-02-08: 75 mg via ORAL
  Filled 2022-02-07: qty 1

## 2022-02-07 MED ORDER — NITROGLYCERIN 0.4 MG SL SUBL
0.4000 mg | SUBLINGUAL_TABLET | SUBLINGUAL | Status: DC | PRN
Start: 2022-02-07 — End: 2022-02-08

## 2022-02-07 MED ORDER — NITROGLYCERIN 1 MG/10 ML FOR IR/CATH LAB
INTRA_ARTERIAL | Status: DC | PRN
  Administered 2022-02-07 (×2): 200 ug via INTRACORONARY

## 2022-02-07 MED ORDER — NITROGLYCERIN 1 MG/10 ML FOR IR/CATH LAB
INTRA_ARTERIAL | Status: AC
  Filled 2022-02-07: qty 10

## 2022-02-07 MED ORDER — SODIUM CHLORIDE 0.9 % WEIGHT BASED INFUSION
3.0000 mL/kg/h | INTRAVENOUS | Status: DC
  Administered 2022-02-07: 3 mL/kg/h via INTRAVENOUS

## 2022-02-07 MED ORDER — SODIUM CHLORIDE 0.9 % IV SOLN
5.0000 mg/kg | Freq: Once | INTRAVENOUS | Status: DC
  Filled 2022-02-07: qty 17.14

## 2022-02-07 MED ORDER — SODIUM CHLORIDE 0.9 % WEIGHT BASED INFUSION: 1.0000 mL/kg/h | INTRAVENOUS | Status: AC

## 2022-02-07 MED ORDER — FENTANYL CITRATE (PF) 100 MCG/2ML IJ SOLN
INTRAMUSCULAR | Status: AC
  Filled 2022-02-07: qty 2

## 2022-02-07 SURGICAL SUPPLY — 27 items
BALL SAPPHIRE NC24 4.5X15 (BALLOONS) ×1
BALLN SCOREFLEX 3.0X15 (BALLOONS) ×1
BALLOON SAPPHIRE NC24 4.5X15 (BALLOONS) IMPLANT
BALLOON SCOREFLEX 3.0X15 (BALLOONS) IMPLANT
CATH LAUNCHER 6FR EBU3.5 (CATHETERS) IMPLANT
CATH OPTICROSS HD (CATHETERS) IMPLANT
CATH TELEPORT (CATHETERS) IMPLANT
CROWN DIAMONDBACK CLASSIC 1.25 (BURR) IMPLANT
DEVICE RAD COMP TR BAND LRG (VASCULAR PRODUCTS) IMPLANT
ELECT DEFIB PAD ADLT CADENCE (PAD) IMPLANT
GLIDESHEATH SLEND SS 6F .021 (SHEATH) IMPLANT
GUIDEWIRE INQWIRE 1.5J.035X260 (WIRE) IMPLANT
INQWIRE 1.5J .035X260CM (WIRE) ×2
KIT ENCORE 26 ADVANTAGE (KITS) IMPLANT
KIT HEART LEFT (KITS) ×1 IMPLANT
LUBRICANT VIPERSLIDE CORONARY (MISCELLANEOUS) IMPLANT
PACK CARDIAC CATHETERIZATION (CUSTOM PROCEDURE TRAY) ×1 IMPLANT
SLED PULL BACK IVUS (MISCELLANEOUS) IMPLANT
STENT SYNERGY XD 4.0X24 (Permanent Stent) IMPLANT
SYNERGY XD 4.0X24 (Permanent Stent) ×1 IMPLANT
TRANSDUCER W/STOPCOCK (MISCELLANEOUS) ×1 IMPLANT
TUBING CIL FLEX 10 FLL-RA (TUBING) ×1 IMPLANT
WIRE ASAHI PROWATER 180CM (WIRE) IMPLANT
WIRE ASAHI PROWATER 300CM (WIRE) IMPLANT
WIRE FIGHTER CROSSING 190CM (WIRE) IMPLANT
WIRE PT2 MS 185 (WIRE) IMPLANT
WIRE VIPERWIRE COR FLEX .012 (WIRE) IMPLANT

## 2022-02-07 NOTE — Progress Notes (Signed)
C/O dizziness and nausea. Denies pain. Skin warm and dry. Medicated for nausea.

## 2022-02-07 NOTE — Plan of Care (Signed)

## 2022-02-07 NOTE — Interval H&P Note (Signed)
History and Physical Interval Note:  02/07/2022 7:13 AM  Colin Pena  has presented today for surgery, with the diagnosis of CAD.  The various methods of treatment have been discussed with the patient and family. After consideration of risks, benefits and other options for treatment, the patient has consented to  Procedure(s): CORONARY ATHERECTOMY (N/A) as a surgical intervention.  The patient's history has been reviewed, patient examined, no change in status, stable for surgery.  I have reviewed the patient's chart and labs.  Questions were answered to the patient's satisfaction.   Cath Lab Visit (complete for each Cath Lab visit)  Clinical Evaluation Leading to the Procedure:   ACS: No.  Non-ACS:    Anginal Classification: CCS II  Anti-ischemic medical therapy: Maximal Therapy (2 or more classes of medications)  Non-Invasive Test Results: High-risk stress test findings: cardiac mortality >3%/year  Prior CABG: No previous CABG        Theron Arista [facility] 02/07/2022 7:13 AM

## 2022-02-07 NOTE — Progress Notes (Signed)
Report and care received from [facility]. Patient resting comfortably at this time. VSS. Rt Radial site soft with no active bleeding or hematoma noted. 2cc of air removed from the band at this time. Will continue to monitor patient.

## 2022-02-07 NOTE — Progress Notes (Signed)
Report and care called to nurse on 6E Laura.  2cc of air removed from band with a total of 10cc remaining in band. Rt radial soft with no active bleeding or hematoma noted. VSS. Patient resting comfortably. Will transport patient to 6E at this time.

## 2022-02-08 ENCOUNTER — Other Ambulatory Visit (HOSPITAL_COMMUNITY): Payer: Self-pay

## 2022-02-08 DIAGNOSIS — Z950 Presence of cardiac pacemaker: Secondary | ICD-10-CM

## 2022-02-08 DIAGNOSIS — I209 Angina pectoris, unspecified: Secondary | ICD-10-CM

## 2022-02-08 DIAGNOSIS — I10 Essential (primary) hypertension: Secondary | ICD-10-CM

## 2022-02-08 DIAGNOSIS — E785 Hyperlipidemia, unspecified: Secondary | ICD-10-CM

## 2022-02-08 DIAGNOSIS — I441 Atrioventricular block, second degree: Secondary | ICD-10-CM

## 2022-02-08 DIAGNOSIS — I442 Atrioventricular block, complete: Secondary | ICD-10-CM

## 2022-02-08 DIAGNOSIS — I2510 Atherosclerotic heart disease of native coronary artery without angina pectoris: Secondary | ICD-10-CM

## 2022-02-08 LAB — CBC
HCT: 35.7 % — ABNORMAL LOW (ref 39.0–52.0)
Hemoglobin: 12.2 g/dL — ABNORMAL LOW (ref 13.0–17.0)
MCH: 30.1 pg (ref 26.0–34.0)
MCHC: 34.2 g/dL (ref 30.0–36.0)
MCV: 88.1 fL (ref 80.0–100.0)
Platelets: 157 10*3/uL (ref 150–400)
RBC: 4.05 MIL/uL — ABNORMAL LOW (ref 4.22–5.81)
RDW: 12.8 % (ref 11.5–15.5)
WBC: 6 10*3/uL (ref 4.0–10.5)
nRBC: 0 % (ref 0.0–0.2)

## 2022-02-08 LAB — HEMOGLOBIN A1C
Hgb A1c MFr Bld: 5 % (ref 4.8–5.6)
Mean Plasma Glucose: 96.8 mg/dL

## 2022-02-08 LAB — BASIC METABOLIC PANEL
Anion gap: 9 (ref 5–15)
BUN: 9 mg/dL (ref 8–23)
CO2: 21 mmol/L — ABNORMAL LOW (ref 22–32)
Calcium: 8.6 mg/dL — ABNORMAL LOW (ref 8.9–10.3)
Chloride: 109 mmol/L (ref 98–111)
Creatinine, Ser: 1.1 mg/dL (ref 0.61–1.24)
GFR, Estimated: >60 mL/min (ref >60)
Glucose, Bld: 103 mg/dL — ABNORMAL HIGH (ref 70–99)
Potassium: 3.7 mmol/L (ref 3.5–5.1)
Sodium: 139 mmol/L (ref 135–145)

## 2022-02-08 MED ORDER — LOSARTAN POTASSIUM 25 MG PO TABS
25.0000 mg | ORAL_TABLET | Freq: Every day | ORAL | Status: DC
  Administered 2022-02-08: 25 mg via ORAL
  Filled 2022-02-08: qty 1

## 2022-02-08 MED ORDER — CLOPIDOGREL BISULFATE 75 MG PO TABS
75.0000 mg | ORAL_TABLET | Freq: Every day | ORAL | 3 refills | Status: DC
  Filled 2022-02-08 – 2022-04-04 (×3): qty 30, 30d supply, fill #0
  Filled 2022-05-09: qty 30, 30d supply, fill #1
  Filled 2022-06-13: qty 30, 30d supply, fill #2
  Filled 2022-07-12: qty 30, 30d supply, fill #3
  Filled 2022-08-08: qty 30, 30d supply, fill #4
  Filled 2022-09-07: qty 30, 30d supply, fill #5
  Filled 2022-09-11 (×2): qty 30, 30d supply, fill #6
  Filled 2022-10-11: qty 30, 30d supply, fill #7
  Filled 2022-11-07: qty 30, 30d supply, fill #8
  Filled 2022-12-11: qty 30, 30d supply, fill #9
  Filled 2023-01-08: qty 30, 30d supply, fill #10

## 2022-02-08 MED ORDER — ROSUVASTATIN CALCIUM 20 MG PO TABS
20.0000 mg | ORAL_TABLET | Freq: Every day | ORAL | 3 refills | Status: DC
  Filled 2022-02-08 – 2022-03-12 (×2): qty 30, 30d supply, fill #0

## 2022-02-08 MED ORDER — ASPIRIN 81 MG PO TBEC
81.0000 mg | DELAYED_RELEASE_TABLET | Freq: Every day | ORAL | 2 refills | Status: AC
  Filled 2022-02-08: qty 30, 30d supply, fill #0

## 2022-02-08 MED ORDER — NITROGLYCERIN 0.4 MG SL SUBL
0.4000 mg | SUBLINGUAL_TABLET | SUBLINGUAL | 4 refills | Status: AC | PRN
  Filled 2022-02-08: qty 25, 14d supply, fill #0
  Filled 2022-11-22: qty 25, 14d supply, fill #1

## 2022-02-08 MED ORDER — LOSARTAN POTASSIUM 25 MG PO TABS
25.0000 mg | ORAL_TABLET | Freq: Every day | ORAL | 3 refills | Status: DC
  Filled 2022-02-08 – 2022-04-04 (×3): qty 30, 30d supply, fill #0
  Filled 2022-06-01: qty 30, 30d supply, fill #1
  Filled 2022-07-13: qty 30, 30d supply, fill #2
  Filled 2022-08-22: qty 30, 30d supply, fill #3
  Filled 2022-09-04 – 2022-09-21 (×2): qty 30, 30d supply, fill #4
  Filled 2022-10-25: qty 30, 30d supply, fill #5
  Filled 2022-11-21: qty 30, 30d supply, fill #6

## 2022-02-08 MED ORDER — AMLODIPINE BESYLATE 10 MG PO TABS
10.0000 mg | ORAL_TABLET | Freq: Every day | ORAL | 3 refills | Status: DC
  Filled 2022-02-08 – 2022-04-04 (×3): qty 30, 30d supply, fill #0
  Filled 2022-05-12: qty 30, 30d supply, fill #1
  Filled 2022-06-21: qty 30, 30d supply, fill #2
  Filled 2022-08-09: qty 30, 30d supply, fill #3
  Filled 2022-10-09: qty 30, 30d supply, fill #4
  Filled 2022-11-21: qty 30, 30d supply, fill #5
  Filled 2023-01-04: qty 30, 30d supply, fill #6

## 2022-02-08 MED ORDER — ROSUVASTATIN CALCIUM 20 MG PO TABS
20.0000 mg | ORAL_TABLET | Freq: Every day | ORAL | Status: DC
  Administered 2022-02-08: 20 mg via ORAL
  Filled 2022-02-08: qty 1

## 2022-02-08 MED ORDER — METOPROLOL SUCCINATE ER 25 MG PO TB24
25.0000 mg | ORAL_TABLET | Freq: Every day | ORAL | 6 refills | Status: DC
  Filled 2022-02-08 – 2022-04-04 (×3): qty 30, 30d supply, fill #0
  Filled 2022-06-05: qty 30, 30d supply, fill #1
  Filled 2022-08-04: qty 30, 30d supply, fill #2
  Filled 2022-09-05: qty 30, 30d supply, fill #3
  Filled 2022-10-03: qty 30, 30d supply, fill #4
  Filled 2022-11-01: qty 30, 30d supply, fill #5

## 2022-02-08 NOTE — Progress Notes (Signed)
CARDIAC REHAB PHASE I   PRE:  Rate/Rhythm: 69 paced  BP:  Sitting: 173/100      SaO2: 98 RA  MODE:  Ambulation: 400 ft   POST:  Rate/Rhythm: 97 SR  BP:  Sitting: 184/99    SaO2: 98 RA   Pt ambulated independently in hallway with steady gait. Pt educated on importance of ASA and Plavix. Pt given stent card and heart healthy diet. Reviewed site care, restrictions, and exercise guidelines. Will refer to CRP II [place].  [id number] Reynold Bowen, RN BSN 02/08/2022 9:06 AM

## 2022-02-08 NOTE — Progress Notes (Signed)
Patient given discharge instructions and stated understanding. 

## 2022-02-08 NOTE — Discharge Summary (Addendum)
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   Dist LM to Ost LAD lesion is 65% stenosed with 90% stenosed side branch in Ost Cx to Prox Cx.   Mid LAD lesion is 100% stenosed.   1st Diag lesion is 80% stenosed.   Dist Cx lesion is 99% stenosed.   2nd Mrg lesion is 70% stenosed.   A drug-eluting stent was successfully placed using a SYNERGY XD 4.0X24.   A stent was successfully placed.   Post intervention, there is a 0% residual stenosis.   Post intervention, there is a 0% residual stenosis.   Post intervention, the side branch was reduced to 0% residual stenosis.   Successful PCI of the distal Left main into the ostial LCx using IVUS guidance with orbital atherectomy and DES x 1.    Plan: DAPT for a minimum of 6 months. If patient continues to have anginal symptoms he would be a candidate for dedicated CTO PCI of the LAD at a later date. Anticipate DC in am.  _____________   History of Present Illness     Colin Pena is a 66 y.o. male with with hx of HTN and no cardiac hx presented to ER 01/05/22 with 10 day hx of intermittent dyspnea, fatigue, and diaphoresis and found to be in CHB. Echo showed WMA and he underwent heart catheterization.  Cath with significant three vessel disease. Cardiac MRI showed substantial viability in the anterior wall.  Subclinical MI, as he denied CP history or  any memorable event.  He was evaluated by CT surgery Dr. Prescott Gum who felt surgical revascularization of the chronically occluded LAD would not be possible.  The pt underwent placement of dual chamber PPM 01/10/22. He was seen in OP clinic wit Dr. Martinique to consider PCI of his CAD.    Dr. Martinique planned for IVUS-guided PCI of left main into LCX. CTO PCI of LAD would be considered if he has refractory symptoms. Only symptoms prior to PCI was dyspnea, no chest pain.   Hospital Course     Consultants: none  CAD Pt presented for scheduled heart catheterization. He has successful PCI of the distal left main into the ostial LCX using IVUS guidance and orbital atherectomy and DES x 1. Pt tolerated the procedure well and was placed on ASA and plavix. BB and amlodipine continued. No further symptoms this morning, rested well last night. - will add on A1c    Hyperlipidemia with LDL goal < 55 Given left main disease and significant 3 vessel disease, would opt for a lower LDL.   On 10 mg crestor, will increase this to 20 mg. 01/07/2022: Cholesterol 139; HDL 25; LDL Cholesterol 85; Triglycerides 145; VLDL 29 LP(a) 19.5 Recheck lipids in another 2 weeks or at office follow up.    Hypertension Continue toprol and amlodipine.  Added 25 mg losartan for BP control  CHB with PPM in place Doing well No further symptoms PPM pocket nicely healed   Pt seen and examined by Dr. Gwenlyn Found and felt stable for discharge. Follow up  has been arranged. He has ambulated with cardiac rehab.   Did the patient have an acute coronary syndrome (MI, NSTEMI, STEMI, etc) this admission?:  No                               Did the patient have a percutaneous coronary intervention (stent / angioplasty)?:  Yes.     Cath/PCI Registry Performance & Quality Measures: Aspirin prescribed? - Yes ADP Receptor Inhibitor (Plavix/Clopidogrel, Brilinta/Ticagrelor or Effient/Prasugrel) prescribed (includes medically managed  patients)? - Yes High Intensity Statin (Lipitor 40-80mg  or Crestor 20-40mg ) prescribed? - Yes For EF <40%, was ACEI/ARB prescribed? - Not Applicable (EF >/= [id number]) For EF <40%, Aldosterone Antagonist (Spironolactone or Eplerenone) prescribed? - Not Applicable (EF >/= [id number]) Cardiac Rehab Phase II ordered? - Yes       The patient will be scheduled for a TOC follow up appointment in 7-14 days.  A message has been sent to the [facility] and Scheduling Pool at the office where the patient should be seen for follow up.  _____________  Discharge Vitals Blood pressure 137/85, pulse 84, temperature 97.7 F (36.5 C), temperature source Oral, resp. rate 16, height 5' 8.5" (1.74 m), weight 85.7 kg, SpO2 99 %.  Filed Weights   02/07/22 0513  Weight: 85.7 kg    Physical Exam Constitutional:      Appearance: Normal appearance.  HENT:     Head: Atraumatic.  Eyes:     Extraocular Movements: Extraocular movements intact.  Cardiovascular:     Rate and Rhythm: Normal rate and regular rhythm.     Heart sounds: No murmur heard. Pulmonary:     Effort: Pulmonary effort is normal.     Breath sounds: Normal breath sounds.  Abdominal:     Palpations: Abdomen is soft.  Musculoskeletal:     Right lower leg: No edema.     Left lower leg: No edema.  Skin:    General: Skin is warm and dry.  Neurological:     Mental Status: He is alert and oriented to person, place, and time.  Psychiatric:        Mood and Affect: Mood normal.        Behavior: Behavior normal.   Right radial site C/D/I   Labs & Radiologic Studies    CBC Recent Labs    02/08/22 0407  WBC 6.0  HGB 12.2*  HCT 35.7*  MCV 88.1  PLT [id number]   Basic Metabolic Panel Recent Labs    02/08/22 0407  NA 139  K 3.7  CL 109  CO2 21*  GLUCOSE 103*  BUN 9  CREATININE 1.10  CALCIUM 8.6*   Liver Function Tests No results for input(s): "AST", "ALT", "ALKPHOS", "BILITOT", "PROT", "ALBUMIN" in the last 72 hours. No results for input(s):  "LIPASE", "AMYLASE" in the last 72 hours. High Sensitivity Troponin:   No results for input(s): "TROPONINIHS" in the last 720 hours.  BNP Invalid input(s): "POCBNP" D-Dimer No results for input(s): "DDIMER" in the last 72 hours. Hemoglobin A1C No results for input(s): "HGBA1C" in the last 72 hours. Fasting Lipid Panel No results for input(s): "CHOL", "HDL", "LDLCALC", "TRIG", "CHOLHDL", "LDLDIRECT" in the last 72 hours. Thyroid Function Tests No results for input(s): "TSH", "T4TOTAL", "T3FREE", "THYROIDAB" in  the last 72 hours.  Invalid input(s): "FREET3" _____________  CARDIAC CATHETERIZATION  Result Date: 02/07/2022   Mid LM lesion is 25% stenosed.   Dist LM to Ost LAD lesion is 65% stenosed with 90% stenosed side branch in Ost Cx to Prox Cx.   Mid LAD lesion is 100% stenosed.   1st Diag lesion is 80% stenosed.   Dist Cx lesion is 99% stenosed.   2nd Mrg lesion is 70% stenosed.   A drug-eluting stent was successfully placed using a SYNERGY XD 4.0X24.   A stent was successfully placed.   Post intervention, there is a 0% residual stenosis.   Post intervention, there is a 0% residual stenosis.   Post intervention, the side branch was reduced to 0% residual stenosis. Successful PCI of the distal Left main into the ostial LCx using IVUS guidance with orbital atherectomy and DES x 1. Plan: DAPT for a minimum of 6 months. If patient continues to have anginal symptoms he would be a candidate for dedicated CTO PCI of the LAD at a later date. Anticipate DC in am.   DG Chest 2 View  Result Date: 01/11/2022 CLINICAL DATA:  Pacemaker insertion. EXAM: CHEST - 2 VIEW COMPARISON:  PA and lateral 01/05/2022. FINDINGS: PA Lat at 5:22 a.m. Left infraclavicular power source has been inserted and is partially visible. Dual leads extend from the power source with 1 terminating in the right atrium and the other the right ventricle. There is overlying monitor wiring. No pneumothorax is seen. Heart size and  vasculature are normal. Chronic elevated right hemidiaphragm associated with overlying right basal subsegmental atelectasis. The lungs are otherwise clear. No pleural effusion is seen. There is thoracic spondylosis. IMPRESSION: No evidence of acute chest disease. Left chest pacing system with right atrial and ventricular wire insertions. Electronically Signed   By: Almira Bar M.D.   On: 01/11/2022 07:29   EP PPM/ICD IMPLANT  Result Date: 01/10/2022  CONCLUSIONS:  1.  Symptomatic complete heart block  2.  Severe three-vessel coronary artery disease  3.  No early apparent complications.  4.  Plan for further coronary evaluation and possible intervention 2 to 3 weeks after pacemaker implant.   MR CARDIAC MORPHOLOGY W WO CONTRAST  Result Date: 01/09/2022 CLINICAL DATA:  12M with HTN p/w 2:1 AV block, intermittent complete heart block. Echo 01/06/22 with EF 55% (apical akinesis/dyskinesis), normal RV function, no significant valvular disease. Cath 7/21 showed CTO mid LAD, 65% distal LM, 80% D1, 99% mid to distal LCX, 50% mid RCA, 85% RPDA. CMR to evaluate viability. EXAM: CARDIAC MRI TECHNIQUE: The patient was scanned on a 1.5 Tesla Siemens magnet. A dedicated cardiac coil was used. Functional imaging was done using Fiesta sequences. 2,3, and 4 chamber views were done to assess for RWMA's. Modified Simpson's rule using a short axis stack was used to calculate an ejection fraction on a dedicated work [employer]. The patient received 10 cc of Gadavist. After 10 minutes inversion recovery sequences were used to assess for infiltration and scar tissue. CONTRAST:  10 cc  of Gadavist FINDINGS: Left ventricle: -Mild hypertrophy -Normal size -Normal global systolic function. Apical anterior akinesis. Small apical aneurysm -Normal ECV (27%) -Normal T2 values -Subendocardial LGE consistent with prior infarct in mid to apical anterior wall and apex. LGE<50% transmural in mid anterior wall, suggesting  viability. LGE >50% transmural in apical anterior wall and apex, suggesting nonviability in distal LAD territory. LV EF: 56% (Normal 56-78%) Absolute volumes: LV EDV: (  Normal 77-195 mL) LV ESV: 31mL (Normal 19-72 mL) LV SV: 42mL (Normal 51-133 mL) CO: 3.8L/min (Normal 2.8-8.8 L/min) Indexed volumes: LV EDV: 70mL/sq-m (Normal 47-92 mL/sq-m) LV ESV: 62mL/sq-m (Normal 13-30 mL/sq-m) LV SV: 83mL/sq-m (Normal 32-62 mL/sq-m) CI: 1.9L/min/sq-m (Normal 1.7-4.2 L/min/sq-m) Right ventricle: Normal size and systolic function RV EF:  55% (Normal 47-74%) Absolute volumes: RV EDV: 127mL (Normal 88-227 mL) RV ESV: 16mL (Normal 23-103 mL) RV SV: 31mL (Normal 52-138 mL) CO: 3.9L/min (Normal 2.8-8.8 L/min) Indexed volumes: RV EDV: 85mL/sq-m (Normal 55-105 mL/sq-m) RV ESV: 42mL/sq-m (Normal 15-43 mL/sq-m) RV SV: 35mL/sq-m (Normal 32-64 mL/sq-m) CI: 1.9L/min/sq-m (Normal 1.7-4.2 L/min/sq-m) Left atrium: Normal size Right atrium: Normal size Mitral valve: Trivial regurgitation Aortic valve: Tricuspid. No regurgitation Tricuspid valve: Trivial regurgitation Pulmonic valve: No regurgitation Aorta: Normal proximal ascending aorta Pericardium: Normal IMPRESSION: 1. 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 LGE is less than 50% transmural in mid anterior wall, suggesting viability. LGE is greater than 50% transmural in apical anterior wall and apex, suggesting nonviability in distal LAD territory. 2. Normal LV size, mild hypertrophy, and normal global systolic function (EF 0000000). Apical anterior akinesis. Small apical aneurysm. 3.  Normal RV size and systolic function (EF [id number]) Electronically Signed   By: Oswaldo Milian M.D.   On: 01/09/2022 21:58    Disposition   Pt is being discharged home today in good condition.  Follow-up Plans & Appointments     Follow-up Information     Martinique, Peter M, MD Follow up on 02/23/2022.   Specialty: Cardiology Why:  8:20 am for cath follow up Contact information: [address] [place] [address] [phone]                Discharge Instructions     AMB Referral to Cardiac Rehabilitation - Phase II   Complete by: As directed    Diagnosis: Coronary Stents   After initial evaluation and assessments completed: Virtual Based Care may be provided alone or in conjunction with Phase 2 Cardiac Rehab based on patient barriers.: Yes       Discharge Medications   Allergies as of 02/08/2022   No Known Allergies      Medication List     STOP taking these medications    aspirin 81 MG chewable tablet Replaced by: aspirin EC 81 MG tablet       TAKE these medications    acetaminophen 325 MG tablet Commonly known as: TYLENOL Take 2 tablets (650 mg total) by mouth every 4 (four) hours as needed for headache or mild pain. What changed: how much to take   amLODipine 10 MG tablet Commonly known as: NORVASC Take 1 tablet (10 mg total) by mouth daily.   aspirin EC 81 MG tablet Take 1 tablet (81 mg total) by mouth daily. Swallow whole. Replaces: aspirin 81 MG chewable tablet   clopidogrel 75 MG tablet Commonly known as: PLAVIX Take 1 tablet (75 mg total) by mouth daily.   losartan 25 MG tablet Commonly known as: Cozaar Take 1 tablet (25 mg total) by mouth daily.   metoprolol succinate 25 MG 24 hr tablet Commonly known as: TOPROL-XL Take 1 tablet (25 mg total) by mouth daily.   nitroGLYCERIN 0.4 MG SL tablet Commonly known as: NITROSTAT Place 1 tablet (0.4 mg total) under the tongue every 5 (five) minutes x 3 doses as needed for chest pain.   NONFORMULARY OR COMPOUNDED ITEM Triamcinolone 0.1%/Eucerin. Unknown ratio.1  application topically daily as directed. What changed: additional instructions   rosuvastatin 20 MG tablet Commonly known as: CRESTOR Take 1 tablet (20 mg total) by mouth daily. What changed:  medication strength how much to take            Outstanding Labs/Studies   BMP with new losartan  A1c pending for risk factor modification  Duration of Discharge Encounter   Greater than 30 minutes including physician time.  Signed, Roe Rutherford Duke, PA 02/08/2022, 8:47 AM  Agree with note by Micah Flesher PA-C  Status post planned left main/circumflex orbital atherectomy, PCI and stenting via the right radial approach.  Known occluded LAD with viability by MRI.  Status post permanent transvenous pacemaker for heart block during last hospitalization.  Pacer pocket appears well-healed.  Patient is asymptomatic.  He is on aspirin and clopidogrel.  He is stable for discharge home today.  Will arrange follow-up with Dr. Swaziland as an outpatient.  Runell Gess, M.D., FACP, [facility], [facility] [address] [place], [place]  [address]  [phone] 02/08/2022 10:37 AM

## 2022-02-10 ENCOUNTER — Telehealth: Payer: Self-pay

## 2022-02-10 NOTE — Telephone Encounter (Signed)
Discussed pt with Dr Swaziland he states that the SOB is not common after a stent. He said that this may be reflux he may try Pepcid and see if this helps. Do not take Omeprazole there is an interaction with his medications.  Pt informed of providers recommendations. Pt verbalized understanding. No further questions.  He will arrive early for his appt.

## 2022-02-10 NOTE — Telephone Encounter (Signed)
Left message to call back  

## 2022-02-10 NOTE — Telephone Encounter (Signed)
Patient contacted regarding discharge from [facility] on 02/07/2022 - 02/08/2022 (29 hours)  Patient understands to follow up with provider 02/23/2022 at 8:20 AM with Peter Swaziland, MD at [facility]. Patient understands discharge instructions? YES  Patient understands medications and regiment? YES Patient understands to bring all medications to this visit? YES  Pt states that intermittently after eating he feels SOB, this last "about a minute" then it goes away. This did not happen while in the hospital.

## 2022-02-10 NOTE — Telephone Encounter (Signed)
-----   Message from Marcelino Duster, [place] sent at 02/08/2022 10:40 AM EDT ----- Pt will need a TOC phone call.  Discharging today.  Thanks Angie

## 2022-02-10 NOTE — Telephone Encounter (Signed)
Pt returning nurses call. Call transferred 

## 2022-02-21 NOTE — Progress Notes (Signed)
Cardiology Office Note   Date:  02/23/2022   ID:  Colin Pena 1956-04-22, MRN [id number]  PCP:  Georgina Quint, MD  Cardiologist:  Olga Millers MD   Chief Complaint  Patient presents with   Coronary Artery Disease      History of Present Illness: Colin Pena is a 66 y.o. male who is seen for interventional assessment of his CAD. He presented on 01/06/22 with intermittent symptoms of near syncope, acute dyspnea and fatigue. He was found to be in complete heart block. Echo showed apical akinesis/dyskinesis with EF 55%. Due to wall motion abnormality  cardiac cath was performed showing CTO of the mid LAD. There was moderate distal left main disease of 65%. The ostial LCx had 90% stenosis. There was some distal vessel disease as well. The PDA had an 85% lesion in the mid vessel. MRI was done for viability showing EF 56%. There was late gadolinium enhancement in the myocardium suggesting substantial viability in the anterior wall. He was seen in consultation with Dr Donata Clay who felt that the LAD was not graftable. Consideration given to treating the non LAD disease with PCI. The patient underwent placement of dual chamber pacemaker on 01/10/22. He is now seen to consider possible percutaneous treatment of his CAD.   He subsequently underwent PCI with  atherectomy and stenting of the left main into the LCx on 02/07/22.   He reports since he had his pacemaker and stent placed he has really felt well. He works for [employer] and does a lot of walking. Now he only gets SOB when he goes up 3 flights of stairs. Otherwise feels normal. Some bruising at cath site. Pacer site has healed well.     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  01/11/2022    Past Surgical History:  Procedure Laterality Date   CORONARY ATHERECTOMY N/A 02/07/2022    Procedure: CORONARY ATHERECTOMY;  Surgeon: Swaziland, Tyiana Hill M, MD;  Location: [facility] INVASIVE CV LAB;  Service: Cardiovascular;  Laterality: N/A;   CORONARY STENT INTERVENTION N/A 02/07/2022   Procedure: CORONARY STENT INTERVENTION;  Surgeon: Swaziland, Almus Woodham M, MD;  Location: [facility] INVASIVE CV LAB;  Service: Cardiovascular;  Laterality: N/A;   INTRAVASCULAR ULTRASOUND/IVUS N/A 02/07/2022   Procedure: Intravascular Ultrasound/IVUS;  Surgeon: Swaziland, Lilliauna Van M, MD;  Location: [facility] INVASIVE CV LAB;  Service: Cardiovascular;  Laterality: N/A;   LEFT HEART CATH AND CORONARY ANGIOGRAPHY N/A 01/06/2022   Procedure: LEFT HEART CATH AND CORONARY ANGIOGRAPHY;  Surgeon: [name], MD;  Location: [facility] INVASIVE CV LAB;  Service: Cardiovascular;  Laterality: N/A;   PACEMAKER IMPLANT N/A 01/10/2022   Procedure: PACEMAKER IMPLANT;  Surgeon: Lanier Prude, MD;  Location: [facility] INVASIVE CV LAB;  Service: Cardiovascular;  Laterality: N/A;     Current Outpatient Medications  Medication Sig Dispense Refill   acetaminophen (TYLENOL) 325 MG tablet Take 2 tablets (650 mg total) by mouth every 4 (four) hours as needed for headache or mild pain. (Patient taking differently: Take 325 mg by mouth every 4 (four) hours as needed for headache or mild pain.)     amLODipine (NORVASC) 10 MG tablet Take 1 tablet (10 mg total) by mouth daily. 90 tablet 3   aspirin EC 81 MG tablet Take 1 tablet (81 mg total) by mouth daily. Swallow whole. 150  tablet 2   clopidogrel (PLAVIX) 75 MG tablet Take 1 tablet (75 mg total) by mouth daily. 90 tablet 3   losartan (COZAAR) 25 MG tablet Take 1 tablet (25 mg total) by mouth daily. 90 tablet 3   metoprolol succinate (TOPROL-XL) 25 MG 24 hr tablet Take 1 tablet (25 mg total) by mouth daily. 30 tablet 6   nitroGLYCERIN (NITROSTAT) 0.4 MG SL tablet Place 1 tablet (0.4 mg total) under the tongue every 5 (five) minutes x 3 doses as needed for chest pain. 25 tablet 4   NONFORMULARY OR COMPOUNDED ITEM  Triamcinolone 0.1%/Eucerin. Unknown ratio.1 application topically daily as directed. (Patient taking differently: Triamcinolone 0.1%/Eucerin. Unknown ratio.1 application topically daily as needed for eczema) 1 each 5   rosuvastatin (CRESTOR) 20 MG tablet Take 1 tablet (20 mg total) by mouth daily. 90 tablet 3   No current facility-administered medications for this visit.    Allergies:   Patient has no known allergies.    Social History:  The patient  reports that he quit smoking about 45 years ago. His smoking use included cigarettes. He has never used smokeless tobacco. He reports that he does not drink alcohol and does not use drugs.   Family History:  The patient's family history includes Cancer in his father, maternal aunt, maternal uncle, maternal uncle, paternal uncle, and paternal uncle; Coronary artery disease in his father; Heart attack in his sister; Heart disease in his father, mother, and paternal uncle; Heart failure in his mother; Hypertension in his sister.    ROS:  Please see the history of present illness.   Otherwise, review of systems are positive for none.   All other systems are reviewed and negative.    PHYSICAL EXAM: VS:  BP 138/72   Pulse 83   Ht 5' 8.5" (1.74 m)   Wt 194 lb (88 kg)   SpO2 100%   BMI 29.07 kg/m  , BMI Body mass index is 29.07 kg/m. GEN: Well nourished, well developed, in no acute distress HEENT: normal Neck: no JVD, carotid bruits, or masses Cardiac: RRR; no murmurs, rubs, or gallops,no edema. Pacer site is healing well. Some bruising in right forearm but good radial pulse Respiratory:  clear to auscultation bilaterally, normal work of breathing GI: soft, nontender, nondistended, + BS MS: no deformity or atrophy Skin: warm and dry, no rash Neuro:  Strength and sensation are intact Psych: euthymic mood, full affect   EKG:  EKG is not ordered today. The ekg ordered today demonstrates N/A   Recent Labs: 01/05/2022: ALT 48 01/06/2022: TSH  1.540 02/08/2022: BUN 9; Creatinine, Ser 1.10; Hemoglobin 12.2; Platelets 157; Potassium 3.7; Sodium 139    Lipid Panel    Component Value Date/Time   CHOL 139 01/07/2022 0215   TRIG 145 01/07/2022 0215   HDL 25 (L) 01/07/2022 0215   CHOLHDL 5.6 01/07/2022 0215   VLDL 29 01/07/2022 0215   LDLCALC 85 01/07/2022 0215      Wt Readings from Last 3 Encounters:  02/23/22 194 lb (88 kg)  02/07/22 189 lb (85.7 kg)  01/24/22 194 lb 9.6 oz (88.3 kg)      Other studies Reviewed: Additional studies/ records that were reviewed today include:  IMPRESSIONS     1. Patient in complete heart block during study Small area of apical  akinesis /dyskinesis . Left ventricular ejection fraction, by estimation,  is 55%. The left ventricle has normal function. The left ventricle  demonstrates regional wall motion  abnormalities (see  scoring diagram/findings for description). There is  mild left ventricular hypertrophy. Left ventricular diastolic parameters  are indeterminate.   2. Right ventricular systolic function is normal. The right ventricular  size is normal.   3. The mitral valve is normal in structure. No evidence of mitral valve  regurgitation. No evidence of mitral stenosis.   4. The aortic valve is tricuspid. There is mild calcification of the  aortic valve. Aortic valve regurgitation is not visualized. Aortic valve  sclerosis is present, with no evidence of aortic valve stenosis.   5. The inferior vena cava is normal in size with greater than 50%  respiratory variability, suggesting right atrial pressure of 3 mmHg.   Cardiac cath:  LEFT HEART CATH AND CORONARY ANGIOGRAPHY   Conclusion  Conclusions: Severe three-vessel coronary artery disease, as detailed below. Mildly elevated left ventricular filling pressure.   Recommendations: Cardiac MRI for myocardial viability with attention on LAD territory. Cardiac surgery consultation and Heart Team evaluation to discuss optimal  revascularization strategy. Follow-up EP recommendations. Aggressive secondary prevention of coronary artery disease.   [name], MD [facility]  Coronary Diagrams  Diagnostic Dominance: Right  Intervention   Cardiac MRI: CLINICAL DATA:  74M with HTN p/w 2:1 AV block, intermittent complete heart block. Echo 01/06/22 with EF 55% (apical akinesis/dyskinesis), normal RV function, no significant valvular disease. Cath 7/21 showed CTO mid LAD, 65% distal LM, 80% D1, 99% mid to distal LCX, 50% mid RCA, 85% RPDA. CMR to evaluate viability.   EXAM: CARDIAC MRI   TECHNIQUE: The patient was scanned on a 1.5 Tesla Siemens magnet. A dedicated cardiac coil was used. Functional imaging was done using Fiesta sequences. 2,3, and 4 chamber views were done to assess for RWMA's. Modified Simpson's rule using a short axis stack was used to calculate an ejection fraction on a dedicated work [employer]. The patient received 10 cc of Gadavist. After 10 minutes inversion recovery sequences were used to assess for infiltration and scar tissue.   CONTRAST:  10 cc  of Gadavist   FINDINGS: Left ventricle:   -Mild hypertrophy   -Normal size   -Normal global systolic function. Apical anterior akinesis. Small apical aneurysm   -Normal ECV (27%)   -Normal T2 values   -Subendocardial LGE consistent with prior infarct in mid to apical anterior wall and apex. LGE<50% transmural in mid anterior wall, suggesting viability. LGE >50% transmural in apical anterior wall and apex, suggesting nonviability in distal LAD territory.   LV EF: 56% (Normal 56-78%)   Absolute volumes:   LV EDV: (Normal 77-195 mL)   LV ESV: 53mL (Normal 19-72 mL)   LV SV: 74mL (Normal 51-133 mL)   CO: 3.8L/min (Normal 2.8-8.8 L/min)   Indexed volumes:   LV EDV: 61mL/sq-m (Normal 47-92 mL/sq-m)   LV ESV: 67mL/sq-m (Normal 13-30 mL/sq-m)   LV SV: 75mL/sq-m (Normal 32-62 mL/sq-m)    CI: 1.9L/min/sq-m (Normal 1.7-4.2 L/min/sq-m)   Right ventricle: Normal size and systolic function   RV EF:  55% (Normal 47-74%)   Absolute volumes:   RV EDV: (Normal 88-227 mL)   RV ESV: 72mL (Normal 23-103 mL)   RV SV: 14mL (Normal 52-138 mL)   CO: 3.9L/min (Normal 2.8-8.8 L/min)   Indexed volumes:   RV EDV: 65mL/sq-m (Normal 55-105 mL/sq-m)   RV ESV: 69mL/sq-m (Normal 15-43 mL/sq-m)   RV SV: 73mL/sq-m (Normal 32-64 mL/sq-m)   CI: 1.9L/min/sq-m (Normal 1.7-4.2 L/min/sq-m)   Left atrium: Normal size   Right  atrium: Normal size   Mitral valve: Trivial regurgitation   Aortic valve: Tricuspid. No regurgitation   Tricuspid valve: Trivial regurgitation   Pulmonic valve: No regurgitation   Aorta: Normal proximal ascending aorta   Pericardium: Normal   IMPRESSION: 1. 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 LGE is less than 50% transmural in mid anterior wall, suggesting viability. LGE is greater than 50% transmural in apical anterior wall and apex, suggesting nonviability in distal LAD territory.   2. Normal LV size, mild hypertrophy, and normal global systolic function (EF 0000000). Apical anterior akinesis. Small apical aneurysm.   3.  Normal RV size and systolic function (EF [id number])     Electronically Signed   By: Oswaldo Milian M.D.   On: 01/09/2022 21:58  PCI 02/07/22:  CORONARY STENT INTERVENTION  CORONARY ATHERECTOMY  Intravascular Ultrasound/IVUS   Conclusion      Mid LM lesion is 25% stenosed.   Dist LM to Ost LAD lesion is 65% stenosed with 90% stenosed side branch in Ost Cx to Prox Cx.   Mid LAD lesion is 100% stenosed.   1st Diag lesion is 80% stenosed.   Dist Cx lesion is 99% stenosed.   2nd Mrg lesion is 70% stenosed.   A drug-eluting stent was successfully placed using a SYNERGY XD 4.0X24.   A stent was successfully placed.   Post intervention, there is  a 0% residual stenosis.   Post intervention, there is a 0% residual stenosis.   Post intervention, the side branch was reduced to 0% residual stenosis.   Successful PCI of the distal Left main into the ostial LCx using IVUS guidance with orbital atherectomy and DES x 1.    Plan: DAPT for a minimum of 6 months. If patient continues to have anginal symptoms he would be a candidate for dedicated CTO PCI of the LAD at a later date. Anticipate DC in am.   Intervention    ASSESSMENT AND PLAN:  1.  Complete heart block s/p pacemaker insertion. Pacer site is healing well. Follow up with EP  2. CAD. Complex CAD with left main stenosis. Mid LAD occlusion, tight ostial LCx. Distal LCX and PDA disease. We reviewed his case at our Heart team meeting. Given the fact that the LAD is likely not graftable  we felt that treatment of the left main into the LCx would be reasonable. This procedure went well with good result. Also discussed the indication for CTO PCI of the LAD would be refractory symptoms despite medical therapy.  I think management of PDA and distal LCx disease is medication. Will continue DAPT for one year. At this point he doesn't have significant symptoms to warrant CTO PCI of the LAD but will monitor as he resumes normal activity.   3.  HTN. Excellent control.   4. Hypercholesterolemia. Now on Crestor. Goal LDL <70. Will check fasting labs next month   Current medicines are reviewed at length with the patient today.  The patient does not have concerns regarding medicines.  The following changes have been made:  none  Labs/ tests ordered today include:   Orders Placed This Encounter  Procedures   Basic metabolic panel   Lipid panel   Hepatic function panel      Cardiac Rehabilitation Eligibility Assessment  The patient is ready to start cardiac rehabilitation from a cardiac standpoint.      Disposition:   follow up with me in 3 months  Signed, Chavela Justiniano Martinique, MD  02/23/2022  8:29 AM    [facility] [address], [place], [place], [address] Phone [phone], Fax [phone]

## 2022-02-22 ENCOUNTER — Other Ambulatory Visit (HOSPITAL_COMMUNITY): Payer: Self-pay

## 2022-02-23 ENCOUNTER — Other Ambulatory Visit (HOSPITAL_COMMUNITY): Payer: Self-pay

## 2022-02-23 ENCOUNTER — Ambulatory Visit: Payer: Self-pay | Attending: Cardiology | Admitting: Cardiology

## 2022-02-23 ENCOUNTER — Encounter: Payer: Self-pay | Admitting: Cardiology

## 2022-02-23 VITALS — BP 138/72 | HR 83 | Ht 68.5 in | Wt 194.0 lb

## 2022-02-23 DIAGNOSIS — I442 Atrioventricular block, complete: Secondary | ICD-10-CM

## 2022-02-23 DIAGNOSIS — E7800 Pure hypercholesterolemia, unspecified: Secondary | ICD-10-CM

## 2022-02-23 DIAGNOSIS — I25118 Atherosclerotic heart disease of native coronary artery with other forms of angina pectoris: Secondary | ICD-10-CM

## 2022-02-23 DIAGNOSIS — I441 Atrioventricular block, second degree: Secondary | ICD-10-CM

## 2022-02-23 DIAGNOSIS — I10 Essential (primary) hypertension: Secondary | ICD-10-CM

## 2022-02-23 NOTE — Patient Instructions (Signed)
Medication Instructions:  Continue same medications *If you need a refill on your cardiac medications before your next appointment, please call your pharmacy*   Lab Work: Bmet,lipid and hepatic panels in 1 month   Testing/Procedures: None ordered   Follow-Up: At [facility], you and your health needs are our priority.  As part of our continuing mission to provide you with exceptional heart care, we have created designated Provider Care Teams.  These Care Teams include your primary Cardiologist (physician) and Advanced Practice Providers (APPs -  Physician Assistants and Nurse Practitioners) who all work together to provide you with the care you need, when you need it.  We recommend signing up for the patient portal called "MyChart".  Sign up information is provided on this After Visit Summary.  MyChart is used to connect with patients for Virtual Visits (Telemedicine).  Patients are able to view lab/test results, encounter notes, upcoming appointments, etc.  Non-urgent messages can be sent to your provider as well.   To learn more about what you can do with MyChart, go to [email or web address].    Your next appointment:  3 months    The format for your next appointment: Office    Provider: Dr.Jordan    Important Information About Sugar

## 2022-03-12 ENCOUNTER — Other Ambulatory Visit (HOSPITAL_COMMUNITY): Payer: Self-pay

## 2022-03-13 ENCOUNTER — Other Ambulatory Visit (HOSPITAL_COMMUNITY): Payer: Self-pay

## 2022-03-21 ENCOUNTER — Other Ambulatory Visit (HOSPITAL_COMMUNITY): Payer: Self-pay

## 2022-03-28 LAB — LIPID PANEL
Chol/HDL Ratio: 3.8 ratio (ref 0.0–5.0)
Cholesterol, Total: 152 mg/dL (ref 100–199)
HDL: 40 mg/dL (ref >39)
LDL Chol Calc (NIH): 94 mg/dL (ref 0–99)
Triglycerides: 94 mg/dL (ref 0–149)
VLDL Cholesterol Cal: 18 mg/dL (ref 5–40)

## 2022-03-28 LAB — BASIC METABOLIC PANEL
BUN/Creatinine Ratio: 11 (ref 10–24)
BUN: 13 mg/dL (ref 8–27)
CO2: 22 mmol/L (ref 20–29)
Calcium: 9.3 mg/dL (ref 8.6–10.2)
Chloride: 105 mmol/L (ref 96–106)
Creatinine, Ser: 1.18 mg/dL (ref 0.76–1.27)
Glucose: 96 mg/dL (ref 70–99)
Potassium: 3.7 mmol/L (ref 3.5–5.2)
Sodium: 141 mmol/L (ref 134–144)
eGFR: 68 mL/min/{1.73_m2} (ref >59)

## 2022-03-28 LAB — HEPATIC FUNCTION PANEL
ALT: 23 IU/L (ref 0–44)
AST: 21 IU/L (ref 0–40)
Albumin: 4.9 g/dL (ref 3.9–4.9)
Alkaline Phosphatase: 82 IU/L (ref 44–121)
Bilirubin Total: 0.5 mg/dL (ref 0.0–1.2)
Bilirubin, Direct: 0.16 mg/dL (ref 0.00–0.40)
Total Protein: 7.3 g/dL (ref 6.0–8.5)

## 2022-03-29 ENCOUNTER — Other Ambulatory Visit (HOSPITAL_COMMUNITY): Payer: Self-pay

## 2022-03-29 ENCOUNTER — Telehealth: Payer: Self-pay | Admitting: Cardiology

## 2022-03-29 DIAGNOSIS — Z79899 Other long term (current) drug therapy: Secondary | ICD-10-CM

## 2022-03-29 MED ORDER — ROSUVASTATIN CALCIUM 40 MG PO TABS
40.0000 mg | ORAL_TABLET | Freq: Every day | ORAL | 3 refills | Status: DC
  Filled 2022-03-29 – 2022-04-04 (×3): qty 90, 90d supply, fill #0
  Filled 2022-07-10: qty 90, 90d supply, fill #1
  Filled 2022-10-09: qty 90, 90d supply, fill #2
  Filled 2023-01-12: qty 30, 30d supply, fill #3
  Filled 2023-02-12: qty 30, 30d supply, fill #4
  Filled 2023-03-13: qty 30, 30d supply, fill #5

## 2022-03-29 NOTE — Telephone Encounter (Signed)
Pt made aware and verbalized understanding. New orders placed   The following abnormalities are noted:  chemistries look very good. Cholesterol is not at goal. LDL 94 with goal of <70.  All other values are normal, stable or within acceptable limits. Medication changes / Follow up labs / Other changes or recommendations:   Would recommend increasing Crestor to 40 mg daily. Repeat LFTs and lipids in 3 months   Peter Martinique, MD 03/28/2022 6:32 PM

## 2022-03-29 NOTE — Telephone Encounter (Signed)
Please is returning call concerning blood work. Please call back to discuss

## 2022-04-04 ENCOUNTER — Other Ambulatory Visit (HOSPITAL_COMMUNITY): Payer: Self-pay

## 2022-04-13 ENCOUNTER — Ambulatory Visit (INDEPENDENT_AMBULATORY_CARE_PROVIDER_SITE_OTHER): Payer: Self-pay

## 2022-04-13 DIAGNOSIS — I441 Atrioventricular block, second degree: Secondary | ICD-10-CM

## 2022-04-14 LAB — CUP PACEART REMOTE DEVICE CHECK
Battery Remaining Longevity: 104 mo
Battery Remaining Percentage: 95.5 %
Battery Voltage: 3.02 V
Brady Statistic AP VP Percent: 1 %
Brady Statistic AP VS Percent: 5.8 %
Brady Statistic AS VP Percent: 49 %
Brady Statistic AS VS Percent: 44 %
Brady Statistic RA Percent Paced: 6 %
Brady Statistic RV Percent Paced: 49 %
Date Time Interrogation Session: 20231026042833
Implantable Lead Connection Status: 753985
Implantable Lead Connection Status: 753985
Implantable Lead Implant Date: 20230725
Implantable Lead Implant Date: 20230725
Implantable Lead Location: 753859
Implantable Lead Location: 753860
Implantable Pulse Generator Implant Date: 20230725
Lead Channel Impedance Value: 450 Ohm
Lead Channel Impedance Value: 510 Ohm
Lead Channel Pacing Threshold Amplitude: 0.5 V
Lead Channel Pacing Threshold Amplitude: 0.75 V
Lead Channel Pacing Threshold Pulse Width: 0.5 ms
Lead Channel Pacing Threshold Pulse Width: 0.5 ms
Lead Channel Sensing Intrinsic Amplitude: 2 mV
Lead Channel Sensing Intrinsic Amplitude: 8.3 mV
Lead Channel Setting Pacing Amplitude: 1 V
Lead Channel Setting Pacing Amplitude: 3.5 V
Lead Channel Setting Pacing Pulse Width: 0.5 ms
Lead Channel Setting Sensing Sensitivity: 2 mV
Pulse Gen Model: 2272
Pulse Gen Serial Number: 8100788

## 2022-04-18 ENCOUNTER — Encounter: Payer: Self-pay | Admitting: Cardiology

## 2022-04-20 ENCOUNTER — Ambulatory Visit: Payer: Self-pay | Admitting: Emergency Medicine

## 2022-04-24 NOTE — Progress Notes (Signed)
Remote pacemaker transmission.   

## 2022-05-10 ENCOUNTER — Encounter: Payer: Self-pay | Admitting: Cardiology

## 2022-05-10 ENCOUNTER — Other Ambulatory Visit (HOSPITAL_COMMUNITY): Payer: Self-pay

## 2022-05-12 ENCOUNTER — Other Ambulatory Visit (HOSPITAL_COMMUNITY): Payer: Self-pay

## 2022-05-16 ENCOUNTER — Encounter (HOSPITAL_COMMUNITY): Payer: Self-pay

## 2022-05-16 ENCOUNTER — Telehealth (HOSPITAL_COMMUNITY): Payer: Self-pay

## 2022-05-16 NOTE — Telephone Encounter (Signed)
Pt returned CR phone call and stated he is not interested at this time.   Closed referral 

## 2022-05-16 NOTE — Telephone Encounter (Signed)
Attempted to call patient in regards to Cardiac Rehab - LM on VM Mailed letter 

## 2022-05-16 NOTE — Progress Notes (Unsigned)
Electrophysiology Office Follow up Visit Note:    Date:  05/17/2022   ID:  Colin Pena, DOB [birth year], MRN [id number]  PCP:  Georgina Quint, MD  [facility] Cardiologist:  Olga Millers, MD  [facility] Electrophysiologist:  Lanier Prude, MD    Interval History:    Colin Pena is a 66 y.o. male who presents for a follow up visit.   He underwent PPM implant 01/10/2022.  Following implant he had a PCI 02/07/2022.  He has done well after PPM implant.   He feels well without recurrence of chest pain.  His pacemaker site is healed well.      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  01/11/2022    Past Surgical History:  Procedure Laterality Date   CORONARY ATHERECTOMY N/A 02/07/2022   Procedure: CORONARY ATHERECTOMY;  Surgeon: Swaziland, Peter M, MD;  Location: [facility] INVASIVE CV LAB;  Service: Cardiovascular;  Laterality: N/A;   CORONARY STENT INTERVENTION N/A 02/07/2022   Procedure: CORONARY STENT INTERVENTION;  Surgeon: Swaziland, Peter M, MD;  Location: [facility] INVASIVE CV LAB;  Service: Cardiovascular;  Laterality: N/A;   INTRAVASCULAR ULTRASOUND/IVUS N/A 02/07/2022   Procedure: Intravascular Ultrasound/IVUS;  Surgeon: Swaziland, Peter M, MD;  Location: [facility] INVASIVE CV LAB;  Service: Cardiovascular;  Laterality: N/A;   LEFT HEART CATH AND CORONARY ANGIOGRAPHY N/A 01/06/2022   Procedure: LEFT HEART CATH AND CORONARY ANGIOGRAPHY;  Surgeon: [name], MD;  Location: [facility] INVASIVE CV LAB;  Service: Cardiovascular;  Laterality: N/A;   PACEMAKER IMPLANT N/A 01/10/2022   Procedure: PACEMAKER IMPLANT;  Surgeon: Lanier Prude, MD;  Location: [facility] INVASIVE CV LAB;  Service: Cardiovascular;  Laterality: N/A;    Current Medications: Current Meds  Medication Sig   acetaminophen (TYLENOL) 325 MG tablet Take 2 tablets (650 mg total) by  mouth every 4 (four) hours as needed for headache or mild pain. (Patient taking differently: Take 325 mg by mouth every 4 (four) hours as needed for headache or mild pain.)   amLODipine (NORVASC) 10 MG tablet Take 1 tablet (10 mg total) by mouth daily.   aspirin EC 81 MG tablet Take 1 tablet (81 mg total) by mouth daily. Swallow whole.   clopidogrel (PLAVIX) 75 MG tablet Take 1 tablet (75 mg total) by mouth daily.   losartan (COZAAR) 25 MG tablet Take 1 tablet (25 mg total) by mouth daily.   metoprolol succinate (TOPROL-XL) 25 MG 24 hr tablet Take 1 tablet (25 mg total) by mouth daily.   nitroGLYCERIN (NITROSTAT) 0.4 MG SL tablet Place 1 tablet (0.4 mg total) under the tongue every 5 (five) minutes x 3 doses as needed for chest pain.   NONFORMULARY OR COMPOUNDED ITEM Triamcinolone 0.1%/Eucerin. Unknown ratio.1 application topically daily as directed. (Patient taking differently: Triamcinolone 0.1%/Eucerin. Unknown ratio.1 application topically daily as needed for eczema)   rosuvastatin (CRESTOR) 40 MG tablet Take 1 tablet (40 mg total) by mouth daily.     Allergies:   Patient has no known allergies.   Social History   Socioeconomic History   Marital status: Married    Spouse name: Not on file   Number of children: Not on file   Years of education: Not on file   Highest education level: Not on file  Occupational History   Not on file  Tobacco Use  Smoking status: Former    Types: Cigarettes    Quit date: 1978    Years since quitting: 45.9   Smokeless tobacco: Never  Substance and Sexual Activity   Alcohol use: No    Alcohol/week: 0.0 standard drinks of alcohol   Drug use: No   Sexual activity: Not on file  Other Topics Concern   Not on file  Social History Narrative   Lives locally w/ wife.  Retired but working as Building services engineer.  Does not routinely exercise, but stays active.   Social Determinants of Health   Financial Resource Strain: Not on file  Food  Insecurity: Not on file  Transportation Needs: Not on file  Physical Activity: Not on file  Stress: Not on file  Social Connections: Not on file     Family History: The patient's family history includes Cancer in his father, maternal aunt, maternal uncle, maternal uncle, paternal uncle, and paternal uncle; Coronary artery disease in his father; Heart attack in his sister; Heart disease in his father, mother, and paternal uncle; Heart failure in his mother; Hypertension in his sister.  ROS:   Please see the history of present illness.    All other systems reviewed and are negative.  EKGs/Labs/Other Studies Reviewed:    The following studies were reviewed today:  05/17/2022 in clinic device interrogation personally reviewed. Longevity 10.5 years Underlying rhythm sinus at 63 Atrial paced 6.1% Ventricular paced 44% Adjusted RAP output for maximal battery longevity Longest atrial high rate lasting 3 minutes 28 seconds    Recent Labs: 01/06/2022: TSH 1.540 02/08/2022: Hemoglobin 12.2; Platelets 157 03/28/2022: ALT 23; BUN 13; Creatinine, Ser 1.18; Potassium 3.7; Sodium 141  Recent Lipid Panel    Component Value Date/Time   CHOL 152 03/28/2022 0842   TRIG 94 03/28/2022 0842   HDL 40 03/28/2022 0842   CHOLHDL 3.8 03/28/2022 0842   CHOLHDL 5.6 01/07/2022 0215   VLDL 29 01/07/2022 0215   LDLCALC 94 03/28/2022 0842    Physical Exam:    VS:  BP 132/78   Pulse 65   Ht 5' 8.5" (1.74 m)   Wt 195 lb (88.5 kg)   SpO2 98%   BMI 29.22 kg/m     Wt Readings from Last 3 Encounters:  05/17/22 195 lb (88.5 kg)  02/23/22 194 lb (88 kg)  02/07/22 189 lb (85.7 kg)     GEN:  Well nourished, well developed in no acute distress HEENT: Normal NECK: No JVD; No carotid bruits LYMPHATICS: No lymphadenopathy CARDIAC: RRR, no murmurs, rubs, gallops.  Pacemaker pocket well-healed RESPIRATORY:  Clear to auscultation without rales, wheezing or rhonchi  ABDOMEN: Soft, non-tender,  non-distended MUSCULOSKELETAL:  No edema; No deformity  SKIN: Warm and dry NEUROLOGIC:  Alert and oriented x 3 PSYCHIATRIC:  Normal affect        ASSESSMENT:    1. Heart block AV complete (HCC)   2. Cardiac pacemaker in situ   3. Primary hypertension    PLAN:    In order of problems listed above:  #CHB #PPM in situ Device functioning appropriately. Continue remote monitoring.  #Hypertension At goal today.  Recommend checking blood pressures 1-2 times per week at home and recording the values.  Recommend bringing these recordings to the primary care physician.  #AHR events Discussed atrial high rate episodes with the patient during today's clinic appointment.  Given the brief episodes, favor avoiding exposure to oral anticoagulation at this time.  We will keep a close eye  on the duration of his episodes and if they increase in frequency or duration, will consider anticoagulation  Follow up 1 year with APP.    Medication Adjustments/Labs and Tests Ordered: Current medicines are reviewed at length with the patient today.  Concerns regarding medicines are outlined above.  No orders of the defined types were placed in this encounter.  No orders of the defined types were placed in this encounter.    Signed, Steffanie Dunn, MD, [facility], [facility] 05/17/2022 3:51 PM    Electrophysiology [facility]

## 2022-05-17 ENCOUNTER — Ambulatory Visit: Payer: Self-pay | Attending: Cardiology | Admitting: Cardiology

## 2022-05-17 ENCOUNTER — Ambulatory Visit: Payer: Self-pay | Admitting: Emergency Medicine

## 2022-05-17 ENCOUNTER — Encounter: Payer: Self-pay | Admitting: Cardiology

## 2022-05-17 VITALS — BP 132/78 | HR 65 | Ht 68.5 in | Wt 195.0 lb

## 2022-05-17 DIAGNOSIS — Z950 Presence of cardiac pacemaker: Secondary | ICD-10-CM

## 2022-05-17 DIAGNOSIS — I442 Atrioventricular block, complete: Secondary | ICD-10-CM

## 2022-05-17 DIAGNOSIS — I10 Essential (primary) hypertension: Secondary | ICD-10-CM

## 2022-05-17 NOTE — Patient Instructions (Signed)
Medication Instructions:  Your physician recommends that you continue on your current medications as directed. Please refer to the Current Medication list given to you today.  *If you need a refill on your cardiac medications before your next appointment, please call your pharmacy*  Follow-Up: At [facility], you and your health needs are our priority.  As part of our continuing mission to provide you with exceptional heart care, we have created designated Provider Care Teams.  These Care Teams include your primary Cardiologist (physician) and Advanced Practice Providers (APPs -  Physician Assistants and Nurse Practitioners) who all work together to provide you with the care you need, when you need it.  Your next appointment:   1 year(s)  The format for your next appointment:   In Person  Provider:   You may see CAMERON T LAMBERT, MD or one of the following Advanced Practice Providers on your designated Care Team:   Renee Ursuy, PA-C Michael "Andy" Tillery, PA-C     Important Information About Sugar       

## 2022-05-22 ENCOUNTER — Telehealth: Payer: Self-pay | Admitting: Emergency Medicine

## 2022-05-22 MED ORDER — TRIAMCINOLONE ACETONIDE 0.025 % EX OINT: 1.0000 | TOPICAL_OINTMENT | Freq: Two times a day (BID) | CUTANEOUS | 0 refills | Status: DC

## 2022-05-22 NOTE — Progress Notes (Unsigned)
Cardiology Office Note   Date:  05/24/2022   ID:  Colin, Pena 1955/08/16, MRN [id number]  PCP:  Georgina Quint, MD  Cardiologist:  Olga Millers MD   Chief Complaint  Patient presents with   Coronary Artery Disease      History of Present Illness: Colin Pena is a 66 y.o. male who is seen for interventional assessment of his CAD. He presented on 01/06/22 with intermittent symptoms of near syncope, acute dyspnea and fatigue. He was found to be in complete heart block. Echo showed apical akinesis/dyskinesis with EF 55%. Due to wall motion abnormality  cardiac cath was performed showing CTO of the mid LAD. There was moderate distal left main disease of 65%. The ostial LCx had 90% stenosis. There was some distal vessel disease as well. The PDA had an 85% lesion in the mid vessel. MRI was done for viability showing EF 56%. There was late gadolinium enhancement in the myocardium suggesting substantial viability in the anterior wall. He was seen in consultation with Dr Donata Clay who felt that the LAD was not graftable. Consideration given to treating the non LAD disease with PCI. The patient underwent placement of dual chamber pacemaker on 01/10/22.   He subsequently underwent PCI with  atherectomy and stenting of the left main into the LCx on 02/07/22. He did see Dr Lalla Brothers recently. Noted some atrial high rate episodes on pacemaker but very low burden. Anticoagulation deferred for now but will monitor.   On follow up today he is doing well. Walks daily. He works for [employer] and does a lot of walking. Not SOB and no chest pain. Eating healthier. Not aware of any palpitations.     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  01/11/2022    Past Surgical History:  Procedure Laterality Date   CORONARY ATHERECTOMY N/A 02/07/2022    Procedure: CORONARY ATHERECTOMY;  Surgeon: Swaziland, Jaquis Picklesimer M, MD;  Location: [facility] INVASIVE CV LAB;  Service: Cardiovascular;  Laterality: N/A;   CORONARY STENT INTERVENTION N/A 02/07/2022   Procedure: CORONARY STENT INTERVENTION;  Surgeon: Swaziland, Keiston Manley M, MD;  Location: [facility] INVASIVE CV LAB;  Service: Cardiovascular;  Laterality: N/A;   INTRAVASCULAR ULTRASOUND/IVUS N/A 02/07/2022   Procedure: Intravascular Ultrasound/IVUS;  Surgeon: Swaziland, Sakura Denis M, MD;  Location: [facility] INVASIVE CV LAB;  Service: Cardiovascular;  Laterality: N/A;   LEFT HEART CATH AND CORONARY ANGIOGRAPHY N/A 01/06/2022   Procedure: LEFT HEART CATH AND CORONARY ANGIOGRAPHY;  Surgeon: [name], MD;  Location: [facility] INVASIVE CV LAB;  Service: Cardiovascular;  Laterality: N/A;   PACEMAKER IMPLANT N/A 01/10/2022   Procedure: PACEMAKER IMPLANT;  Surgeon: Lanier Prude, MD;  Location: [facility] INVASIVE CV LAB;  Service: Cardiovascular;  Laterality: N/A;     Current Outpatient Medications  Medication Sig Dispense Refill   acetaminophen (TYLENOL) 325 MG tablet Take 2 tablets (650 mg total) by mouth every 4 (four) hours as needed for headache or mild pain. (Patient taking differently: Take 325 mg by mouth every 4 (four) hours as needed for headache or mild pain.)     amLODipine (NORVASC) 10 MG tablet Take 1 tablet (10 mg total) by mouth daily. 90 tablet 3   aspirin EC 81 MG tablet Take 1 tablet (81 mg total) by mouth daily. Swallow whole. 150 tablet 2   clopidogrel (  PLAVIX) 75 MG tablet Take 1 tablet (75 mg total) by mouth daily. 90 tablet 3   losartan (COZAAR) 25 MG tablet Take 1 tablet (25 mg total) by mouth daily. 90 tablet 3   metoprolol succinate (TOPROL-XL) 25 MG 24 hr tablet Take 1 tablet (25 mg total) by mouth daily. 30 tablet 6   nitroGLYCERIN (NITROSTAT) 0.4 MG SL tablet Place 1 tablet (0.4 mg total) under the tongue every 5 (five) minutes x 3 doses as needed for chest pain. 25 tablet 4   NONFORMULARY OR COMPOUNDED ITEM Triamcinolone  0.1%/Eucerin. Unknown ratio.1 application topically daily as directed. (Patient taking differently: Triamcinolone 0.1%/Eucerin. Unknown ratio.1 application topically daily as needed for eczema) 1 each 5   rosuvastatin (CRESTOR) 40 MG tablet Take 1 tablet (40 mg total) by mouth daily. 90 tablet 3   triamcinolone (KENALOG) 0.025 % ointment Apply 1 Application topically 2 (two) times daily. 30 g 0   No current facility-administered medications for this visit.    Allergies:   Patient has no known allergies.    Social History:  The patient  reports that he quit smoking about 45 years ago. His smoking use included cigarettes. He has never used smokeless tobacco. He reports that he does not drink alcohol and does not use drugs.   Family History:  The patient's family history includes Cancer in his father, maternal aunt, maternal uncle, maternal uncle, paternal uncle, and paternal uncle; Coronary artery disease in his father; Heart attack in his sister; Heart disease in his father, mother, and paternal uncle; Heart failure in his mother; Hypertension in his sister.    ROS:  Please see the history of present illness.   Otherwise, review of systems are positive for none.   All other systems are reviewed and negative.    PHYSICAL EXAM: VS:  BP 134/82 (BP Location: Left Arm, Patient Position: Sitting, Cuff Size: Large)   Pulse 86   Ht 5\' 8"  (1.727 m)   Wt 194 lb (88 kg)   SpO2 96%   BMI 29.50 kg/m  , BMI Body mass index is 29.5 kg/m. GEN: Well nourished, well developed, in no acute distress HEENT: normal Neck: no JVD, carotid bruits, or masses Cardiac: RRR; no murmurs, rubs, or gallops,no edema. Pacer site is healing well. Some bruising in right forearm but good radial pulse Respiratory:  clear to auscultation bilaterally, normal work of breathing GI: soft, nontender, nondistended, + BS MS: no deformity or atrophy Skin: warm and dry, no rash Neuro:  Strength and sensation are intact Psych:  euthymic mood, full affect   EKG:  EKG is not ordered today. The ekg ordered today demonstrates N/A   Recent Labs: 01/06/2022: TSH 1.540 02/08/2022: Hemoglobin 12.2; Platelets 157 03/28/2022: ALT 23; BUN 13; Creatinine, Ser 1.18; Potassium 3.7; Sodium 141    Lipid Panel    Component Value Date/Time   CHOL 152 03/28/2022 0842   TRIG 94 03/28/2022 0842   HDL 40 03/28/2022 0842   CHOLHDL 3.8 03/28/2022 0842   CHOLHDL 5.6 01/07/2022 0215   VLDL 29 01/07/2022 0215   LDLCALC 94 03/28/2022 0842      Wt Readings from Last 3 Encounters:  05/24/22 194 lb (88 kg)  05/17/22 195 lb (88.5 kg)  02/23/22 194 lb (88 kg)      Other studies Reviewed: Additional studies/ records that were reviewed today include:  IMPRESSIONS     1. Patient in complete heart block during study Small area of apical  akinesis /dyskinesis .  Left ventricular ejection fraction, by estimation,  is 55%. The left ventricle has normal function. The left ventricle  demonstrates regional wall motion  abnormalities (see scoring diagram/findings for description). There is  mild left ventricular hypertrophy. Left ventricular diastolic parameters  are indeterminate.   2. Right ventricular systolic function is normal. The right ventricular  size is normal.   3. The mitral valve is normal in structure. No evidence of mitral valve  regurgitation. No evidence of mitral stenosis.   4. The aortic valve is tricuspid. There is mild calcification of the  aortic valve. Aortic valve regurgitation is not visualized. Aortic valve  sclerosis is present, with no evidence of aortic valve stenosis.   5. The inferior vena cava is normal in size with greater than 50%  respiratory variability, suggesting right atrial pressure of 3 mmHg.   Cardiac cath:  LEFT HEART CATH AND CORONARY ANGIOGRAPHY   Conclusion  Conclusions: Severe three-vessel coronary artery disease, as detailed below. Mildly elevated left ventricular filling  pressure.   Recommendations: Cardiac MRI for myocardial viability with attention on LAD territory. Cardiac surgery consultation and Heart Team evaluation to discuss optimal revascularization strategy. Follow-up EP recommendations. Aggressive secondary prevention of coronary artery disease.   Colin Bush, MD [facility]  Coronary Diagrams  Diagnostic Dominance: Right  Intervention   Cardiac MRI: CLINICAL DATA:  76M with HTN p/w 2:1 AV block, intermittent complete heart block. Echo 01/06/22 with EF 55% (apical akinesis/dyskinesis), normal RV function, no significant valvular disease. Cath 7/21 showed CTO mid LAD, 65% distal LM, 80% D1, 99% mid to distal LCX, 50% mid RCA, 85% RPDA. CMR to evaluate viability.   EXAM: CARDIAC MRI   TECHNIQUE: The patient was scanned on a 1.5 Tesla Siemens magnet. A dedicated cardiac coil was used. Functional imaging was done using Fiesta sequences. 2,3, and 4 chamber views were done to assess for RWMA's. Modified Simpson's rule using a short axis stack was used to calculate an ejection fraction on a dedicated work Conservation officer, nature. The patient received 10 cc of Gadavist. After 10 minutes inversion recovery sequences were used to assess for infiltration and scar tissue.   CONTRAST:  10 cc  of Gadavist   FINDINGS: Left ventricle:   -Mild hypertrophy   -Normal size   -Normal global systolic function. Apical anterior akinesis. Small apical aneurysm   -Normal ECV (27%)   -Normal T2 values   -Subendocardial LGE consistent with prior infarct in mid to apical anterior wall and apex. LGE<50% transmural in mid anterior wall, suggesting viability. LGE >50% transmural in apical anterior wall and apex, suggesting nonviability in distal LAD territory.   LV EF: 56% (Normal 56-78%)   Absolute volumes:   LV EDV: 141mL (Normal 77-195 mL)   LV ESV: 54mL (Normal 19-72 mL)   LV SV: 22mL (Normal 51-133 mL)   CO: 3.8L/min  (Normal 2.8-8.8 L/min)   Indexed volumes:   LV EDV: 40mL/sq-m (Normal 47-92 mL/sq-m)   LV ESV: 47mL/sq-m (Normal 13-30 mL/sq-m)   LV SV: 30mL/sq-m (Normal 32-62 mL/sq-m)   CI: 1.9L/min/sq-m (Normal 1.7-4.2 L/min/sq-m)   Right ventricle: Normal size and systolic function   RV EF:  55% (Normal 47-74%)   Absolute volumes:   RV EDV: 146mL (Normal 88-227 mL)   RV ESV: 82mL (Normal 23-103 mL)   RV SV: 70mL (Normal 52-138 mL)   CO: 3.9L/min (Normal 2.8-8.8 L/min)   Indexed volumes:   RV EDV: 37mL/sq-m (Normal 55-105 mL/sq-m)   RV ESV: 95mL/sq-m (Normal  15-43 mL/sq-m)   RV SV: 39mL/sq-m (Normal 32-64 mL/sq-m)   CI: 1.9L/min/sq-m (Normal 1.7-4.2 L/min/sq-m)   Left atrium: Normal size   Right atrium: Normal size   Mitral valve: Trivial regurgitation   Aortic valve: Tricuspid. No regurgitation   Tricuspid valve: Trivial regurgitation   Pulmonic valve: No regurgitation   Aorta: Normal proximal ascending aorta   Pericardium: Normal   IMPRESSION: 1. 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 LGE is less than 50% transmural in mid anterior wall, suggesting viability. LGE is greater than 50% transmural in apical anterior wall and apex, suggesting nonviability in distal LAD territory.   2. Normal LV size, mild hypertrophy, and normal global systolic function (EF 0000000). Apical anterior akinesis. Small apical aneurysm.   3.  Normal RV size and systolic function (EF [id number])     Electronically Signed   By: Oswaldo Milian M.D.   On: 01/09/2022 21:58  PCI 02/07/22:  CORONARY STENT INTERVENTION  CORONARY ATHERECTOMY  Intravascular Ultrasound/IVUS   Conclusion      Mid LM lesion is 25% stenosed.   Dist LM to Ost LAD lesion is 65% stenosed with 90% stenosed side branch in Ost Cx to Prox Cx.   Mid LAD lesion is 100% stenosed.   1st Diag lesion is 80% stenosed.   Dist Cx lesion is 99%  stenosed.   2nd Mrg lesion is 70% stenosed.   A drug-eluting stent was successfully placed using a SYNERGY XD 4.0X24.   A stent was successfully placed.   Post intervention, there is a 0% residual stenosis.   Post intervention, there is a 0% residual stenosis.   Post intervention, the side branch was reduced to 0% residual stenosis.   Successful PCI of the distal Left main into the ostial LCx using IVUS guidance with orbital atherectomy and DES x 1.    Plan: DAPT for a minimum of 6 months. If patient continues to have anginal symptoms he would be a candidate for dedicated CTO PCI of the LAD at a later date. Anticipate DC in am.   Intervention     PCI 02/07/22: Procedures  CORONARY STENT INTERVENTION  CORONARY ATHERECTOMY  Intravascular Ultrasound/IVUS   Conclusion      Mid LM lesion is 25% stenosed.   Dist LM to Ost LAD lesion is 65% stenosed with 90% stenosed side branch in Ost Cx to Prox Cx.   Mid LAD lesion is 100% stenosed.   1st Diag lesion is 80% stenosed.   Dist Cx lesion is 99% stenosed.   2nd Mrg lesion is 70% stenosed.   A drug-eluting stent was successfully placed using a SYNERGY XD 4.0X24.   A stent was successfully placed.   Post intervention, there is a 0% residual stenosis.   Post intervention, there is a 0% residual stenosis.   Post intervention, the side branch was reduced to 0% residual stenosis.   Successful PCI of the distal Left main into the ostial LCx using IVUS guidance with orbital atherectomy and DES x 1.    Plan: DAPT for a minimum of 6 months. If patient continues to have anginal symptoms he would be a candidate for dedicated CTO PCI of the LAD at a later date. Anticipate DC in am.   ntervention    Implants   ASSESSMENT AND PLAN:  1.  Complete heart block s/p pacemaker insertion. Pacer site is healing well. Follow up with EP  2. CAD. Complex CAD with  left main stenosis. Mid LAD occlusion, tight ostial LCx. Distal LCX and PDA disease. We  reviewed his case at our Heart team meeting. Given the fact that the LAD is likely not graftable  we felt that treatment of the left main into the LCx would be reasonable. This procedure went well with good result. Also discussed the indication for CTO PCI of the LAD would be refractory symptoms despite medical therapy. He is currently asymptomatic.  I think management of PDA and distal LCx disease is medication. Will continue DAPT for one year.   3.  HTN. Good control.   4. Hypercholesterolemia. Now on Crestor. Goal LDL <70. We increased Crestor in October. Will repeat labs in Jan.   5. ? Afib noted on pacer check low burden. Will monitor. Defer anticoagulation for now unless burden increases.    Current medicines are reviewed at length with the patient today.  The patient does not have concerns regarding medicines.  The following changes have been made:  none  Labs/ tests ordered today include:   No orders of the defined types were placed in this encounter.        Disposition:   follow up with me in 6 months  Signed, Colin Bitner Martinique, MD  05/24/2022 8:46 AM    [facility] [address] [place], [place], [address] Phone [phone], Fax [phone]

## 2022-05-22 NOTE — Telephone Encounter (Signed)
Called patient and left message informing patient that his rx was sent to his pharmacy 

## 2022-05-22 NOTE — Telephone Encounter (Signed)
Okay to prescribe triamcinolone cream

## 2022-05-22 NOTE — Telephone Encounter (Signed)
Patient needs his Triamcioclone - refilled - please send to [facility] pharmacy

## 2022-05-23 ENCOUNTER — Other Ambulatory Visit (HOSPITAL_COMMUNITY): Payer: Self-pay

## 2022-05-23 ENCOUNTER — Other Ambulatory Visit: Payer: Self-pay | Admitting: *Deleted

## 2022-05-23 MED ORDER — TRIAMCINOLONE ACETONIDE 0.025 % EX OINT
1.0000 | TOPICAL_OINTMENT | Freq: Two times a day (BID) | CUTANEOUS | 0 refills | Status: DC
  Filled 2022-05-23: qty 30, 15d supply, fill #0
  Filled 2022-05-23: qty 30, 30d supply, fill #0
  Filled 2022-06-13: qty 30, 15d supply, fill #0

## 2022-05-23 NOTE — Telephone Encounter (Signed)
Medication sent back in to correct pharmacy

## 2022-05-23 NOTE — Telephone Encounter (Signed)
Patient called back regarding his medication that was filled. Patient stated that his medications triamcinolone (KENALOG) 0.025 % ointment  & NONFORMULARY OR COMPOUNDED ITEM were sent to the wrong pharmacy. Patient stated that he needs them to be sent to  [facility]  Instead. Pharmacy number is [phone].

## 2022-05-24 ENCOUNTER — Encounter: Payer: Self-pay | Admitting: Cardiology

## 2022-05-24 ENCOUNTER — Other Ambulatory Visit (HOSPITAL_COMMUNITY): Payer: Self-pay

## 2022-05-24 ENCOUNTER — Ambulatory Visit: Payer: Self-pay | Attending: Cardiology | Admitting: Cardiology

## 2022-05-24 VITALS — BP 134/82 | HR 86 | Ht 68.0 in | Wt 194.0 lb

## 2022-05-24 DIAGNOSIS — I10 Essential (primary) hypertension: Secondary | ICD-10-CM

## 2022-05-24 DIAGNOSIS — E7800 Pure hypercholesterolemia, unspecified: Secondary | ICD-10-CM

## 2022-05-24 DIAGNOSIS — Z950 Presence of cardiac pacemaker: Secondary | ICD-10-CM

## 2022-05-24 DIAGNOSIS — I442 Atrioventricular block, complete: Secondary | ICD-10-CM

## 2022-05-24 DIAGNOSIS — I25118 Atherosclerotic heart disease of native coronary artery with other forms of angina pectoris: Secondary | ICD-10-CM

## 2022-05-24 MED ORDER — TRIAMCINOLONE ACETONIDE 0.1 % EX CREA
1.0000 | TOPICAL_CREAM | Freq: Every day | CUTANEOUS | 4 refills | Status: DC
  Filled 2022-05-24: qty 240, 30d supply, fill #0

## 2022-05-24 MED ORDER — TRIAMCINOLONE ACETONIDE 0.025 % EX CREA: 1.0000 | TOPICAL_CREAM | Freq: Two times a day (BID) | CUTANEOUS | 0 refills | Status: DC

## 2022-06-01 ENCOUNTER — Other Ambulatory Visit: Payer: Self-pay

## 2022-06-05 ENCOUNTER — Other Ambulatory Visit (HOSPITAL_COMMUNITY): Payer: Self-pay

## 2022-06-13 ENCOUNTER — Other Ambulatory Visit (HOSPITAL_COMMUNITY): Payer: Self-pay

## 2022-06-15 ENCOUNTER — Ambulatory Visit: Payer: Self-pay | Admitting: Emergency Medicine

## 2022-06-21 ENCOUNTER — Other Ambulatory Visit: Payer: Self-pay

## 2022-06-21 ENCOUNTER — Other Ambulatory Visit (HOSPITAL_COMMUNITY): Payer: Self-pay

## 2022-06-28 ENCOUNTER — Other Ambulatory Visit: Payer: Self-pay | Admitting: Emergency Medicine

## 2022-06-28 ENCOUNTER — Other Ambulatory Visit (HOSPITAL_COMMUNITY): Payer: Self-pay

## 2022-06-28 MED ORDER — TRIAMCINOLONE ACETONIDE 0.1 % EX CREA
1.0000 | TOPICAL_CREAM | Freq: Every day | CUTANEOUS | 4 refills | Status: AC
  Filled 2022-06-28: qty 240, 14d supply, fill #0

## 2022-06-30 ENCOUNTER — Other Ambulatory Visit (HOSPITAL_COMMUNITY): Payer: Self-pay

## 2022-07-03 ENCOUNTER — Other Ambulatory Visit (HOSPITAL_COMMUNITY): Payer: Self-pay

## 2022-07-11 ENCOUNTER — Other Ambulatory Visit: Payer: Self-pay

## 2022-07-12 ENCOUNTER — Other Ambulatory Visit: Payer: Self-pay

## 2022-07-13 ENCOUNTER — Ambulatory Visit (INDEPENDENT_AMBULATORY_CARE_PROVIDER_SITE_OTHER): Payer: Self-pay

## 2022-07-13 DIAGNOSIS — I441 Atrioventricular block, second degree: Secondary | ICD-10-CM

## 2022-07-13 LAB — CUP PACEART REMOTE DEVICE CHECK
Battery Remaining Longevity: 123 mo
Battery Remaining Percentage: 95.5 %
Battery Voltage: 3.01 V
Brady Statistic AP VP Percent: 2.1 %
Brady Statistic AP VS Percent: 4 %
Brady Statistic AS VP Percent: 51 %
Brady Statistic AS VS Percent: 42 %
Brady Statistic RA Percent Paced: 5.7 %
Brady Statistic RV Percent Paced: 53 %
Date Time Interrogation Session: 20240125040030
Implantable Lead Connection Status: 753985
Implantable Lead Connection Status: 753985
Implantable Lead Implant Date: 20230725
Implantable Lead Implant Date: 20230725
Implantable Lead Location: 753859
Implantable Lead Location: 753860
Implantable Pulse Generator Implant Date: 20230725
Lead Channel Impedance Value: 430 Ohm
Lead Channel Impedance Value: 490 Ohm
Lead Channel Pacing Threshold Amplitude: 0.5 V
Lead Channel Pacing Threshold Amplitude: 0.75 V
Lead Channel Pacing Threshold Pulse Width: 0.5 ms
Lead Channel Pacing Threshold Pulse Width: 0.5 ms
Lead Channel Sensing Intrinsic Amplitude: 1.3 mV
Lead Channel Sensing Intrinsic Amplitude: 7.4 mV
Lead Channel Setting Pacing Amplitude: 1 V
Lead Channel Setting Pacing Amplitude: 1.5 V
Lead Channel Setting Pacing Pulse Width: 0.5 ms
Lead Channel Setting Sensing Sensitivity: 2 mV
Pulse Gen Model: 2272
Pulse Gen Serial Number: 8100788

## 2022-07-13 LAB — BASIC METABOLIC PANEL
BUN/Creatinine Ratio: 10 (ref 10–24)
BUN: 11 mg/dL (ref 8–27)
CO2: 21 mmol/L (ref 20–29)
Calcium: 9.2 mg/dL (ref 8.6–10.2)
Chloride: 102 mmol/L (ref 96–106)
Creatinine, Ser: 1.11 mg/dL (ref 0.76–1.27)
Glucose: 101 mg/dL — ABNORMAL HIGH (ref 70–99)
Potassium: 4.2 mmol/L (ref 3.5–5.2)
Sodium: 137 mmol/L (ref 134–144)
eGFR: 74 mL/min/{1.73_m2} (ref >59)

## 2022-07-13 LAB — HEPATIC FUNCTION PANEL
ALT: 20 IU/L (ref 0–44)
AST: 22 IU/L (ref 0–40)
Albumin: 4.1 g/dL (ref 3.9–4.9)
Alkaline Phosphatase: 88 IU/L (ref 44–121)
Bilirubin Total: 0.5 mg/dL (ref 0.0–1.2)
Bilirubin, Direct: 0.19 mg/dL (ref 0.00–0.40)
Total Protein: 6.4 g/dL (ref 6.0–8.5)

## 2022-07-13 LAB — LIPID PANEL
Chol/HDL Ratio: 1.9 ratio (ref 0.0–5.0)
Cholesterol, Total: 80 mg/dL — ABNORMAL LOW (ref 100–199)
HDL: 42 mg/dL (ref >39)
LDL Chol Calc (NIH): 23 mg/dL (ref 0–99)
Triglycerides: 65 mg/dL (ref 0–149)
VLDL Cholesterol Cal: 15 mg/dL (ref 5–40)

## 2022-07-14 ENCOUNTER — Other Ambulatory Visit: Payer: Self-pay

## 2022-08-02 ENCOUNTER — Telehealth: Payer: Self-pay | Admitting: Cardiology

## 2022-08-02 NOTE — Progress Notes (Signed)
Remote pacemaker transmission.   

## 2022-08-02 NOTE — Telephone Encounter (Signed)
Returned call to Pt.  He was concerned that he saw the word "NULL" on his device report.    Advised his device was working normally.  Apologized for confusion regarding report.

## 2022-08-02 NOTE — Telephone Encounter (Signed)
Pt would like a callback regarding questions that he has about device report. Please advise

## 2022-08-04 ENCOUNTER — Other Ambulatory Visit: Payer: Self-pay

## 2022-08-04 ENCOUNTER — Other Ambulatory Visit (HOSPITAL_COMMUNITY): Payer: Self-pay

## 2022-08-08 ENCOUNTER — Other Ambulatory Visit (HOSPITAL_COMMUNITY): Payer: Self-pay

## 2022-08-09 ENCOUNTER — Other Ambulatory Visit (HOSPITAL_COMMUNITY): Payer: Self-pay

## 2022-08-22 ENCOUNTER — Other Ambulatory Visit (HOSPITAL_COMMUNITY): Payer: Self-pay

## 2022-09-04 ENCOUNTER — Other Ambulatory Visit (HOSPITAL_COMMUNITY): Payer: Self-pay

## 2022-09-05 ENCOUNTER — Other Ambulatory Visit (HOSPITAL_COMMUNITY): Payer: Self-pay

## 2022-09-07 ENCOUNTER — Other Ambulatory Visit: Payer: Self-pay

## 2022-09-11 ENCOUNTER — Other Ambulatory Visit (HOSPITAL_COMMUNITY): Payer: Self-pay

## 2022-09-21 ENCOUNTER — Other Ambulatory Visit (HOSPITAL_COMMUNITY): Payer: Self-pay

## 2022-09-21 ENCOUNTER — Other Ambulatory Visit: Payer: Self-pay

## 2022-09-22 ENCOUNTER — Other Ambulatory Visit (HOSPITAL_COMMUNITY): Payer: Self-pay

## 2022-10-03 ENCOUNTER — Other Ambulatory Visit (HOSPITAL_COMMUNITY): Payer: Self-pay

## 2022-10-09 ENCOUNTER — Other Ambulatory Visit (HOSPITAL_COMMUNITY): Payer: Self-pay

## 2022-10-10 ENCOUNTER — Other Ambulatory Visit (HOSPITAL_COMMUNITY): Payer: Self-pay

## 2022-10-11 ENCOUNTER — Other Ambulatory Visit (HOSPITAL_COMMUNITY): Payer: Self-pay

## 2022-10-12 ENCOUNTER — Ambulatory Visit (INDEPENDENT_AMBULATORY_CARE_PROVIDER_SITE_OTHER): Payer: Self-pay

## 2022-10-12 DIAGNOSIS — I442 Atrioventricular block, complete: Secondary | ICD-10-CM

## 2022-10-12 LAB — CUP PACEART REMOTE DEVICE CHECK
Battery Remaining Longevity: 120 mo
Battery Remaining Percentage: 95.5 %
Battery Voltage: 3.01 V
Brady Statistic AP VP Percent: 2.6 %
Brady Statistic AP VS Percent: 2.1 %
Brady Statistic AS VP Percent: 75 %
Brady Statistic AS VS Percent: 19 %
Brady Statistic RA Percent Paced: 4.4 %
Brady Statistic RV Percent Paced: 78 %
Date Time Interrogation Session: 20240425075717
Implantable Lead Connection Status: 753985
Implantable Lead Connection Status: 753985
Implantable Lead Implant Date: 20230725
Implantable Lead Implant Date: 20230725
Implantable Lead Location: 753859
Implantable Lead Location: 753860
Implantable Pulse Generator Implant Date: 20230725
Lead Channel Impedance Value: 450 Ohm
Lead Channel Impedance Value: 510 Ohm
Lead Channel Pacing Threshold Amplitude: 0.5 V
Lead Channel Pacing Threshold Amplitude: 0.625 V
Lead Channel Pacing Threshold Pulse Width: 0.5 ms
Lead Channel Pacing Threshold Pulse Width: 0.5 ms
Lead Channel Sensing Intrinsic Amplitude: 12 mV
Lead Channel Sensing Intrinsic Amplitude: 2.3 mV
Lead Channel Setting Pacing Amplitude: 0.875
Lead Channel Setting Pacing Amplitude: 1.5 V
Lead Channel Setting Pacing Pulse Width: 0.5 ms
Lead Channel Setting Sensing Sensitivity: 2 mV
Pulse Gen Model: 2272
Pulse Gen Serial Number: 8100788

## 2022-10-25 ENCOUNTER — Other Ambulatory Visit (HOSPITAL_COMMUNITY): Payer: Self-pay

## 2022-11-01 ENCOUNTER — Other Ambulatory Visit (HOSPITAL_COMMUNITY): Payer: Self-pay

## 2022-11-07 ENCOUNTER — Other Ambulatory Visit (HOSPITAL_COMMUNITY): Payer: Self-pay

## 2022-11-07 NOTE — Progress Notes (Signed)
Remote pacemaker transmission.   

## 2022-11-21 ENCOUNTER — Other Ambulatory Visit (HOSPITAL_COMMUNITY): Payer: Self-pay

## 2022-11-21 ENCOUNTER — Ambulatory Visit: Payer: Self-pay | Attending: Physician Assistant | Admitting: Physician Assistant

## 2022-11-21 VITALS — BP 158/72 | HR 61 | Ht 68.0 in | Wt 200.8 lb

## 2022-11-21 DIAGNOSIS — I2510 Atherosclerotic heart disease of native coronary artery without angina pectoris: Secondary | ICD-10-CM

## 2022-11-21 DIAGNOSIS — Z950 Presence of cardiac pacemaker: Secondary | ICD-10-CM

## 2022-11-21 DIAGNOSIS — I10 Essential (primary) hypertension: Secondary | ICD-10-CM

## 2022-11-21 DIAGNOSIS — E785 Hyperlipidemia, unspecified: Secondary | ICD-10-CM

## 2022-11-21 MED ORDER — LOSARTAN POTASSIUM 50 MG PO TABS
50.0000 mg | ORAL_TABLET | Freq: Every day | ORAL | 3 refills | Status: DC
  Filled 2022-11-21: qty 90, 90d supply, fill #0
  Filled 2023-03-07: qty 90, 90d supply, fill #1
  Filled 2023-06-04: qty 90, 90d supply, fill #2
  Filled 2023-08-29: qty 90, 90d supply, fill #3

## 2022-11-21 NOTE — Patient Instructions (Signed)
Medication Instructions:  Increase Losartan to 50 mg ( Take 1 Tablet Daily). *If you need a refill on your cardiac medications before your next appointment, please call your pharmacy*   Lab Work: No Labs If you have labs (blood work) drawn today and your tests are completely normal, you will receive your results only by: MyChart Message (if you have MyChart) OR A paper copy in the mail If you have any lab test that is abnormal or we need to change your treatment, we will call you to review the results.   Testing/Procedures: No Testing   Follow-Up: At [facility], you and your health needs are our priority.  As part of our continuing mission to provide you with exceptional heart care, we have created designated Provider Care Teams.  These Care Teams include your primary Cardiologist (physician) and Advanced Practice Providers (APPs -  Physician Assistants and Nurse Practitioners) who all work together to provide you with the care you need, when you need it.  We recommend signing up for the patient portal called "MyChart".  Sign up information is provided on this After Visit Summary.  MyChart is used to connect with patients for Virtual Visits (Telemedicine).  Patients are able to view lab/test results, encounter notes, upcoming appointments, etc.  Non-urgent messages can be sent to your provider as well.   To learn more about what you can do with MyChart, go to [email or web address].    Your next appointment:   6 month(s)  Provider:   Vonna Drafts

## 2022-11-21 NOTE — Progress Notes (Unsigned)
Cardiology Office Note:    Date:  11/22/2022   ID:  Colin, Pena 11/30/1955, MRN [id number]  PCP:  Georgina Quint, MD   [name] Providers Cardiologist:  Olga Millers, MD Electrophysiologist:  Lanier Prude, MD     Referring MD: Georgina Quint, *   Chief Complaint  Patient presents with   Follow-up    Seen for Dr. Jens Som    History of Present Illness:    Colin Pena is a 66 y.o. 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  Patient initially presented in July 2023 with near syncope, acute dyspnea and fatigue and found to have complete heart block.  Echocardiogram showed apical akinesis and dyskinesis with EF of 55%.  Due to wall motion abnormality, subsequent cardiac catheterization was performed which showed CTO of mid LAD, moderate distal left main disease of 65%, 90% ostial left circumflex lesion, 80% mid PDA lesion.  MRI viability test showed EF 56%, there was late gadolinium enhancement of the myocardium suggest substantial viability of the anterior wall.  He was seen by Dr. [name] trigt who felt LAD was not graftable.  Consideration was given to treat normal LAD disease with PCI.  He eventually underwent dual-chamber pacemaker placement on 01/10/2022.  Afterward, he underwent atherectomy and stenting of the left main into the left circumflex artery 02/07/2022.  He was last seen by Dr. Swaziland in December 2023 at which time he was doing well.  Patient presents today for follow-up.  He denies any recent exertional chest pain.  He did have a one-time vague discomfort 4 months ago that required a single nitroglycerin however symptoms never recurred again.  EKG showed sinus rhythm, right bundle branch block, increased T wave inversion in the lateral leads when compared to the previous EKG.  Talking with the patient, he does not do strenuous activity, however he does every day activity  without any issue.  He has no obvious anginal symptom at this point.  Previous notes indicate he should be on aspirin and Plavix for 1 year, however given the distal left main stent, I will discuss with Dr. Swaziland before deciding whether to take off aspirin or Plavix.  I would prefer to leave him on Plavix long-term if at all possible given distal left main stent.  He has blood pressure remain elevated today, previously his systolic blood pressure has been in the 130s.  On manual repeat, his blood pressure was 158/72.  I will increase losartan to 50 mg daily.  He can follow-up with Dr. Orinda Kenner in 6 months.  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  01/11/2022    Past Surgical History:  Procedure Laterality Date   CORONARY ATHERECTOMY N/A 02/07/2022   Procedure: CORONARY ATHERECTOMY;  Surgeon: Swaziland, Peter M, MD;  Location: [facility] INVASIVE CV LAB;  Service: Cardiovascular;  Laterality: N/A;   CORONARY STENT INTERVENTION N/A 02/07/2022   Procedure: CORONARY STENT INTERVENTION;  Surgeon: Swaziland, Peter M, MD;  Location: [facility] INVASIVE CV LAB;  Service: Cardiovascular;  Laterality: N/A;   CORONARY ULTRASOUND/IVUS N/A 02/07/2022   Procedure: Intravascular Ultrasound/IVUS;  Surgeon: Swaziland, Peter M, MD;  Location: [facility] INVASIVE CV LAB;  Service: Cardiovascular;  Laterality: N/A;   LEFT HEART CATH AND CORONARY ANGIOGRAPHY N/A 01/06/2022   Procedure:  LEFT HEART CATH AND CORONARY ANGIOGRAPHY;  Surgeon: [name], MD;  Location: [facility] INVASIVE CV LAB;  Service: Cardiovascular;  Laterality: N/A;   PACEMAKER IMPLANT N/A 01/10/2022   Procedure: PACEMAKER IMPLANT;  Surgeon: Lanier Prude, MD;  Location: [facility] INVAS[facility] CV LAB;  Service: Cardiovascular;  Laterality: N/A;    Current Medications: Current Meds  Medication Sig   acetaminophen (TYLENOL) 325 MG tablet Take 2 tablets (650 mg  total) by mouth every 4 (four) hours as needed for headache or mild pain. (Patient taking differently: Take 325 mg by mouth every 4 (four) hours as needed for headache or mild pain.)   amLODipine (NORVASC) 10 MG tablet Take 1 tablet (10 mg total) by mouth daily.   aspirin EC 81 MG tablet Take 1 tablet (81 mg total) by mouth daily. Swallow whole.   clopidogrel (PLAVIX) 75 MG tablet Take 1 tablet (75 mg total) by mouth daily.   losartan (COZAAR) 50 MG tablet Take 1 tablet (50 mg total) by mouth daily.   metoprolol succinate (TOPROL-XL) 25 MG 24 hr tablet Take 1 tablet (25 mg total) by mouth daily.   nitroGLYCERIN (NITROSTAT) 0.4 MG SL tablet Place 1 tablet (0.4 mg total) under the tongue every 5 (five) minutes x 3 doses as needed for chest pain.   NONFORMULARY OR COMPOUNDED ITEM Triamcinolone 0.1%/Eucerin. Unknown ratio.1 application topically daily as directed. (Patient taking differently: Triamcinolone 0.1%/Eucerin. Unknown ratio.1 application topically daily as needed for eczema)   rosuvastatin (CRESTOR) 40 MG tablet Take 1 tablet (40 mg total) by mouth daily.   triamcinolone (KENALOG) 0.025 % ointment Apply 1 Application topically 2 (two) times daily.   triamcinolone 0.1%-Eucerin equivalent 1:1 cream mixture Apply to affected area topically once daily.   [DISCONTINUED] losartan (COZAAR) 25 MG tablet Take 1 tablet (25 mg total) by mouth daily.     Allergies:   Patient has no known allergies.   Social History   Socioeconomic History   Marital status: Married    Spouse name: Not on file   Number of children: Not on file   Years of education: Not on file   Highest education level: Not on file  Occupational History   Not on file  Tobacco Use   Smoking status: Former    Types: Cigarettes    Quit date: 48    Years since quitting: 46.4   Smokeless tobacco: Never  Substance and Sexual Activity   Alcohol use: No    Alcohol/week: 0.0 standard drinks of alcohol   Drug use: No   Sexual  activity: Not on file  Other Topics Concern   Not on file  Social History Narrative   Lives locally w/ wife.  Retired but working as Building services engineer.  Does not routinely exercise, but stays active.   Social Determinants of Health   Financial Resource Strain: Not on file  Food Insecurity: Not on file  Transportation Needs: Not on file  Physical Activity: Not on file  Stress: Not on file  Social Connections: Not on file     Family History: The patient's family history includes Cancer in his father, maternal aunt, maternal uncle, maternal uncle, paternal uncle, and paternal uncle; Coronary artery disease in his father; Heart attack in his sister; Heart disease in his father, mother, and paternal uncle; Heart failure in his mother; Hypertension in his sister.  ROS:   Please see the history of present illness.     All other systems reviewed and are negative.  EKGs/Labs/Other  Studies Reviewed:    The following studies were reviewed today:  Echo 01/06/2022  1. Patient in complete heart block during study Small area of apical  akinesis /dyskinesis . Left ventricular ejection fraction, by estimation,  is 55%. The left ventricle has normal function. The left ventricle  demonstrates regional wall motion  abnormalities (see scoring diagram/findings for description). There is  mild left ventricular hypertrophy. Left ventricular diastolic parameters  are indeterminate.   2. Right ventricular systolic function is normal. The right ventricular  size is normal.   3. The mitral valve is normal in structure. No evidence of mitral valve  regurgitation. No evidence of mitral stenosis.   4. The aortic valve is tricuspid. There is mild calcification of the  aortic valve. Aortic valve regurgitation is not visualized. Aortic valve  sclerosis is present, with no evidence of aortic valve stenosis.   5. The inferior vena cava is normal in size with greater than 50%  respiratory variability,  suggesting right atrial pressure of 3 mmHg.    EKG:  EKG is ordered today.  The ekg ordered today demonstrates normal sinus rhythm, right bundle branch block, T wave inversion in the lateral leads and inferior leads.  Recent Labs: 01/06/2022: TSH 1.540 02/08/2022: Hemoglobin 12.2; Platelets 157 07/13/2022: ALT 20; BUN 11; Creatinine, Ser 1.11; Potassium 4.2; Sodium 137  Recent Lipid Panel    Component Value Date/Time   CHOL 80 (L) 07/13/2022 0825   TRIG 65 07/13/2022 0825   HDL 42 07/13/2022 0825   CHOLHDL 1.9 07/13/2022 0825   CHOLHDL 5.6 01/07/2022 0215   VLDL 29 01/07/2022 0215   LDLCALC 23 07/13/2022 0825     Risk Assessment/Calculations:           Physical Exam:    VS:  BP (!) 158/72   Pulse 61   Ht 5\' 8"  (1.727 m)   Wt 200 lb 12.8 oz (91.1 kg)   SpO2 98%   BMI 30.53 kg/m     HYPERTENSION CONTROL Vitals:   11/21/22 0754 11/22/22 1605  BP: (!) 148/74 (!) 158/72    The patient's blood pressure is elevated above target today.  In order to address the patient's elevated BP: A current anti-hypertensive medication was adjusted today.      Wt Readings from Last 3 Encounters:  11/21/22 200 lb 12.8 oz (91.1 kg)  05/24/22 194 lb (88 kg)  05/17/22 195 lb (88.5 kg)     GEN:  Well nourished, well developed in no acute distress HEENT: Normal NECK: No JVD; No carotid bruits LYMPHATICS: No lymphadenopathy CARDIAC: RRR, no murmurs, rubs, gallops RESPIRATORY:  Clear to auscultation without rales, wheezing or rhonchi  ABDOMEN: Soft, non-tender, non-distended MUSCULOSKELETAL:  No edema; No deformity  SKIN: Warm and dry NEUROLOGIC:  Alert and oriented x 3 PSYCHIATRIC:  Normal affect   ASSESSMENT:    1. Coronary artery disease involving native coronary artery of native heart without angina pectoris   2. Essential hypertension   3. Hyperlipidemia LDL goal <70   4. 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 Swaziland to see if he would prefer the patient to stay on Plavix long-term.  Today's EKG shows chronic right bundle branch block, increased T wave  inversion in the lateral leads, however patient denies any exertional anginal symptom.  Will continue to monitor.  Hypertension: Blood pressure elevated, will increase losartan to 50 mg daily  Hyperlipidemia: On Crestor  History of pacemaker: Followed by Dr. Lalla Brothers           Medication Adjustments/Labs and Tests Ordered: Current medicines are reviewed at length with the patient today.  Concerns regarding medicines are outlined above.  Orders Placed This Encounter  Procedures   EKG 12-Lead   Meds ordered this encounter  Medications   losartan (COZAAR) 50 MG tablet    Sig: Take 1 tablet (50 mg total) by mouth daily.    Dispense:  90 tablet    Refill:  3    Patient Instructions  Medication Instructions:  Increase Losartan to 50 mg ( Take 1 Tablet Daily). *If you need a refill on your cardiac medications before your next appointment, please call your pharmacy*   Lab Work: No Labs If you have labs (blood work) drawn today and your tests are completely normal, you will receive your results only by: MyChart Message (if you have MyChart) OR A paper copy in the mail If you have any lab test that is abnormal or we need to change your treatment, we will call you to review the results.   Testing/Procedures: No Testing   Follow-Up: At [facility], you and your health needs are our priority.  As part of our continuing mission to provide you with exceptional heart care, we have created designated Provider Care Teams.  These Care Teams include your primary Cardiologist (physician) and Advanced Practice Providers (APPs -  Physician  Assistants and Nurse Practitioners) who all work together to provide you with the care you need, when you need it.  We recommend signing up for the patient portal called "MyChart".  Sign up information is provided on this After Visit Summary.  MyChart is used to connect with patients for Virtual Visits (Telemedicine).  Patients are able to view lab/test results, encounter notes, upcoming appointments, etc.  Non-urgent messages can be sent to your provider as well.   To learn more about what you can do with MyChart, go to [email or web address].    Your next appointment:   6 month(s)  Provider:   Vonna Drafts      Signed, Azalee Course, [place]  11/22/2022 4:05 PM    [facility]

## 2022-11-22 ENCOUNTER — Encounter: Payer: Self-pay | Admitting: Physician Assistant

## 2022-11-22 ENCOUNTER — Other Ambulatory Visit (HOSPITAL_COMMUNITY): Payer: Self-pay

## 2022-11-22 ENCOUNTER — Other Ambulatory Visit (HOSPITAL_BASED_OUTPATIENT_CLINIC_OR_DEPARTMENT_OTHER): Payer: Self-pay

## 2022-11-24 ENCOUNTER — Telehealth: Payer: Self-pay | Admitting: Physician Assistant

## 2022-11-24 NOTE — Telephone Encounter (Signed)
Please notify Mr. Utsey that I have discussed with Dr. Swaziland, since his stent was in the distal left main into ostial left circumflex vessel, as long as he is not having any bleeding issue, we would prefer he stays on aspirin and plavix long term.

## 2022-11-30 ENCOUNTER — Other Ambulatory Visit (HOSPITAL_COMMUNITY): Payer: Self-pay

## 2022-11-30 ENCOUNTER — Other Ambulatory Visit: Payer: Self-pay | Admitting: Cardiology

## 2022-11-30 MED ORDER — METOPROLOL SUCCINATE ER 25 MG PO TB24
25.0000 mg | ORAL_TABLET | Freq: Every day | ORAL | 3 refills | Status: DC
  Filled 2022-11-30: qty 30, 30d supply, fill #0
  Filled 2022-11-30: qty 90, 90d supply, fill #0
  Filled 2022-11-30: qty 30, 30d supply, fill #0
  Filled 2023-01-03: qty 30, 30d supply, fill #1
  Filled 2023-02-01: qty 30, 30d supply, fill #2
  Filled 2023-03-05: qty 30, 30d supply, fill #3
  Filled 2023-03-27: qty 30, 30d supply, fill #4
  Filled 2023-05-02: qty 30, 30d supply, fill #5
  Filled 2023-05-24: qty 30, 30d supply, fill #6
  Filled 2023-06-27: qty 30, 30d supply, fill #7

## 2022-12-01 ENCOUNTER — Other Ambulatory Visit (HOSPITAL_COMMUNITY): Payer: Self-pay

## 2022-12-11 ENCOUNTER — Other Ambulatory Visit (HOSPITAL_COMMUNITY): Payer: Self-pay

## 2023-01-03 ENCOUNTER — Other Ambulatory Visit (HOSPITAL_COMMUNITY): Payer: Self-pay

## 2023-01-04 ENCOUNTER — Other Ambulatory Visit (HOSPITAL_COMMUNITY): Payer: Self-pay

## 2023-01-04 ENCOUNTER — Other Ambulatory Visit: Payer: Self-pay

## 2023-01-08 ENCOUNTER — Other Ambulatory Visit (HOSPITAL_COMMUNITY): Payer: Self-pay

## 2023-01-11 ENCOUNTER — Ambulatory Visit (INDEPENDENT_AMBULATORY_CARE_PROVIDER_SITE_OTHER): Payer: Self-pay

## 2023-01-11 DIAGNOSIS — I441 Atrioventricular block, second degree: Secondary | ICD-10-CM

## 2023-01-11 LAB — CUP PACEART REMOTE DEVICE CHECK
Battery Remaining Longevity: 115 mo
Battery Remaining Percentage: 94 %
Battery Voltage: 3.01 V
Brady Statistic AP VP Percent: 3 %
Brady Statistic AP VS Percent: 3.6 %
Brady Statistic AS VP Percent: 74 %
Brady Statistic AS VS Percent: 19 %
Brady Statistic RA Percent Paced: 6.4 %
Brady Statistic RV Percent Paced: 77 %
Date Time Interrogation Session: 20240725043655
Implantable Lead Connection Status: 753985
Implantable Lead Connection Status: 753985
Implantable Lead Implant Date: 20230725
Implantable Lead Implant Date: 20230725
Implantable Lead Location: 753859
Implantable Lead Location: 753860
Implantable Pulse Generator Implant Date: 20230725
Lead Channel Impedance Value: 460 Ohm
Lead Channel Impedance Value: 530 Ohm
Lead Channel Pacing Threshold Amplitude: 0.5 V
Lead Channel Pacing Threshold Amplitude: 0.75 V
Lead Channel Pacing Threshold Pulse Width: 0.5 ms
Lead Channel Pacing Threshold Pulse Width: 0.5 ms
Lead Channel Sensing Intrinsic Amplitude: 2.1 mV
Lead Channel Sensing Intrinsic Amplitude: 5.9 mV
Lead Channel Setting Pacing Amplitude: 1 V
Lead Channel Setting Pacing Amplitude: 1.5 V
Lead Channel Setting Pacing Pulse Width: 0.5 ms
Lead Channel Setting Sensing Sensitivity: 2 mV
Pulse Gen Model: 2272
Pulse Gen Serial Number: 8100788

## 2023-01-12 ENCOUNTER — Other Ambulatory Visit (HOSPITAL_COMMUNITY): Payer: Self-pay

## 2023-01-23 NOTE — Progress Notes (Signed)
Remote pacemaker transmission.   

## 2023-02-01 ENCOUNTER — Other Ambulatory Visit: Payer: Self-pay

## 2023-02-01 ENCOUNTER — Other Ambulatory Visit (HOSPITAL_COMMUNITY): Payer: Self-pay

## 2023-02-07 ENCOUNTER — Other Ambulatory Visit (HOSPITAL_COMMUNITY): Payer: Self-pay

## 2023-02-07 ENCOUNTER — Other Ambulatory Visit: Payer: Self-pay | Admitting: Cardiology

## 2023-02-07 ENCOUNTER — Other Ambulatory Visit: Payer: Self-pay

## 2023-02-07 MED ORDER — CLOPIDOGREL BISULFATE 75 MG PO TABS
75.0000 mg | ORAL_TABLET | Freq: Every day | ORAL | 3 refills | Status: DC
  Filled 2023-02-07: qty 90, 90d supply, fill #0
  Filled 2023-05-07: qty 90, 90d supply, fill #1
  Filled 2023-08-02: qty 90, 90d supply, fill #2
  Filled 2023-11-05: qty 90, 90d supply, fill #3

## 2023-02-12 ENCOUNTER — Other Ambulatory Visit (HOSPITAL_COMMUNITY): Payer: Self-pay

## 2023-02-15 ENCOUNTER — Telehealth: Payer: Self-pay | Admitting: Cardiology

## 2023-02-15 ENCOUNTER — Other Ambulatory Visit (HOSPITAL_COMMUNITY): Payer: Self-pay

## 2023-02-15 ENCOUNTER — Other Ambulatory Visit: Payer: Self-pay | Admitting: Cardiology

## 2023-02-15 MED ORDER — AMLODIPINE BESYLATE 10 MG PO TABS
10.0000 mg | ORAL_TABLET | Freq: Every day | ORAL | 2 refills | Status: DC
  Filled 2023-02-15: qty 90, 90d supply, fill #0
  Filled 2023-05-24: qty 90, 90d supply, fill #1
  Filled 2023-09-04: qty 90, 90d supply, fill #2

## 2023-02-15 NOTE — Telephone Encounter (Signed)
Pt's medication was sent to pt's pharmacy as requested. Confirmation received.  °

## 2023-02-15 NOTE — Telephone Encounter (Signed)
 *  STAT* If patient is at the pharmacy, call can be transferred to refill team.   1. Which medications need to be refilled? (please list name of each medication and dose if known)   amLODipine (NORVASC) 10 MG tablet     2. Would you like to learn more about the convenience, safety, & potential cost savings by using the [facility]? no   3. Are you open to using the [facility] (Type [facility] no    4. Which pharmacy/location (including street and city if local pharmacy) is medication to be sent to?[place] [facility]    5. Do they need a 30 day or 90 day supply? 30 days   Pt is leaving out of town on Saturday and need refill as soon as possible

## 2023-03-05 ENCOUNTER — Other Ambulatory Visit (HOSPITAL_COMMUNITY): Payer: Self-pay

## 2023-03-07 ENCOUNTER — Other Ambulatory Visit (HOSPITAL_COMMUNITY): Payer: Self-pay

## 2023-03-13 ENCOUNTER — Other Ambulatory Visit (HOSPITAL_COMMUNITY): Payer: Self-pay

## 2023-03-13 ENCOUNTER — Other Ambulatory Visit: Payer: Self-pay

## 2023-03-27 ENCOUNTER — Other Ambulatory Visit (HOSPITAL_COMMUNITY): Payer: Self-pay

## 2023-04-11 ENCOUNTER — Other Ambulatory Visit: Payer: Self-pay | Admitting: Cardiology

## 2023-04-11 ENCOUNTER — Other Ambulatory Visit: Payer: Self-pay

## 2023-04-11 ENCOUNTER — Other Ambulatory Visit (HOSPITAL_COMMUNITY): Payer: Self-pay

## 2023-04-11 ENCOUNTER — Telehealth: Payer: Self-pay | Admitting: Cardiology

## 2023-04-11 MED ORDER — ROSUVASTATIN CALCIUM 40 MG PO TABS
40.0000 mg | ORAL_TABLET | Freq: Every day | ORAL | 1 refills | Status: DC
  Filled 2023-04-11: qty 90, 90d supply, fill #0
  Filled 2023-07-10: qty 90, 90d supply, fill #1

## 2023-04-11 NOTE — Telephone Encounter (Signed)
Patient called again to follow-up on getting this medication today.

## 2023-04-11 NOTE — Telephone Encounter (Signed)
Medication has been refilled and sent to patient's preferred pharmacy

## 2023-04-11 NOTE — Telephone Encounter (Signed)
*  STAT* If patient is at the pharmacy, call can be transferred to refill team.  1. Which medications need to be refilled? (please list name of each medication and dose if known)   rosuvastatin (CRESTOR) 40 MG tablet   2. Would you like to learn more about the convenience, safety, & potential cost savings by using the [facility]?   3. Are you open to using the [facility] (Type [facility]. ).   4. Which pharmacy/location (including street and city if local pharmacy) is medication to be sent to?  [place] [facility]   5. Do they need a 30 day or 90 day supply?   90 day  Patient stated he is out of this medication and will need to have prescription resent to [facility].  Patient has appointment scheduled on 12/4.

## 2023-04-12 ENCOUNTER — Ambulatory Visit (INDEPENDENT_AMBULATORY_CARE_PROVIDER_SITE_OTHER): Payer: Self-pay

## 2023-04-12 DIAGNOSIS — I441 Atrioventricular block, second degree: Secondary | ICD-10-CM

## 2023-04-12 LAB — CUP PACEART REMOTE DEVICE CHECK
Battery Remaining Longevity: 113 mo
Battery Remaining Percentage: 91 %
Battery Voltage: 2.99 V
Brady Statistic AP VP Percent: 3.8 %
Brady Statistic AP VS Percent: 3.2 %
Brady Statistic AS VP Percent: 76 %
Brady Statistic AS VS Percent: 17 %
Brady Statistic RA Percent Paced: 6.7 %
Brady Statistic RV Percent Paced: 79 %
Date Time Interrogation Session: 20241024051921
Implantable Lead Connection Status: 753985
Implantable Lead Connection Status: 753985
Implantable Lead Implant Date: 20230725
Implantable Lead Implant Date: 20230725
Implantable Lead Location: 753859
Implantable Lead Location: 753860
Implantable Pulse Generator Implant Date: 20230725
Lead Channel Impedance Value: 480 Ohm
Lead Channel Impedance Value: 510 Ohm
Lead Channel Pacing Threshold Amplitude: 0.5 V
Lead Channel Pacing Threshold Amplitude: 0.75 V
Lead Channel Pacing Threshold Pulse Width: 0.5 ms
Lead Channel Pacing Threshold Pulse Width: 0.5 ms
Lead Channel Sensing Intrinsic Amplitude: 1.9 mV
Lead Channel Sensing Intrinsic Amplitude: 5.1 mV
Lead Channel Setting Pacing Amplitude: 1 V
Lead Channel Setting Pacing Amplitude: 1.5 V
Lead Channel Setting Pacing Pulse Width: 0.5 ms
Lead Channel Setting Sensing Sensitivity: 2 mV
Pulse Gen Model: 2272
Pulse Gen Serial Number: 8100788

## 2023-05-01 NOTE — Progress Notes (Signed)
Remote pacemaker transmission.   

## 2023-05-02 ENCOUNTER — Other Ambulatory Visit (HOSPITAL_COMMUNITY): Payer: Self-pay

## 2023-05-07 ENCOUNTER — Other Ambulatory Visit (HOSPITAL_COMMUNITY): Payer: Self-pay

## 2023-05-19 NOTE — Progress Notes (Unsigned)
Cardiology Office Note   Date:  05/23/2023   ID:  Colin Pena, Colin Pena 1955/10/13, MRN [id number]  PCP:  Georgina Quint, MD  Cardiologist:  Olga Millers MD   Chief Complaint  Patient presents with   Coronary Artery Disease   Hypertension      History of Present Illness: Colin Pena is a 67 y.o. male who is seen for follow up CAD. He presented on 01/06/22 with intermittent symptoms of near syncope, acute dyspnea and fatigue. He was found to be in complete heart block. Echo showed apical akinesis/dyskinesis with EF 55%. Due to wall motion abnormality  cardiac cath was performed showing CTO of the mid LAD. There was moderate distal left main disease of 65%. The ostial LCx had 90% stenosis. There was some distal vessel disease as well. The PDA had an 85% lesion in the mid vessel. MRI was done for viability showing EF 56%. There was late gadolinium enhancement in the myocardium suggesting substantial viability in the anterior wall. He was seen in consultation with Dr Donata Clay who felt that the LAD was not graftable. Consideration given to treating the non LAD disease with PCI. The patient underwent placement of dual chamber pacemaker on 01/10/22.   He subsequently underwent PCI with  atherectomy and stenting of the left main into the LCx on 02/07/22.   He works for [employer] and does a lot of walking. He states for the paste 8 months he has felt much better with improvement in his breathing. Able to go up 3 flights of stairs now without problem. He did awaken with some chest discomfort this morning for the first time in 8 months. Throbbing in his chest. Took sl Ntg x 2 and symptoms went away after an hour. Thinks he may have eaten too much over the Thanksgiving holiday.     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  01/11/2022    Past  Surgical History:  Procedure Laterality Date   CORONARY ATHERECTOMY N/A 02/07/2022   Procedure: CORONARY ATHERECTOMY;  Surgeon: Swaziland, Mckensi Redinger M, MD;  Location: [facility] INVASIVE CV LAB;  Service: Cardiovascular;  Laterality: N/A;   CORONARY STENT INTERVENTION N/A 02/07/2022   Procedure: CORONARY STENT INTERVENTION;  Surgeon: Swaziland, Amparo Donalson M, MD;  Location: [facility] INVASIVE CV LAB;  Service: Cardiovascular;  Laterality: N/A;   CORONARY ULTRASOUND/IVUS N/A 02/07/2022   Procedure: Intravascular Ultrasound/IVUS;  Surgeon: Swaziland, Navil Kole M, MD;  Location: [facility] INVASIVE CV LAB;  Service: Cardiovascular;  Laterality: N/A;   LEFT HEART CATH AND CORONARY ANGIOGRAPHY N/A 01/06/2022   Procedure: LEFT HEART CATH AND CORONARY ANGIOGRAPHY;  Surgeon: [name], MD;  Location: [facility] INVASIVE CV LAB;  Service: Cardiovascular;  Laterality: N/A;   PACEMAKER IMPLANT N/A 01/10/2022   Procedure: PACEMAKER IMPLANT;  Surgeon: Lanier Prude, MD;  Location: [facility] INVASIVE CV LAB;  Service: Cardiovascular;  Laterality: N/A;     Current Outpatient Medications  Medication Sig Dispense Refill   acetaminophen (TYLENOL) 325 MG tablet Take 2 tablets (650 mg total) by mouth every 4 (four) hours as needed for headache or mild pain. (Patient taking differently: Take 325 mg by mouth every 4 (four) hours as needed for headache or mild pain (pain score 1-3).)     amLODipine (NORVASC) 10 MG tablet Take 1 tablet (10 mg total) by mouth daily.  90 tablet 2   clopidogrel (PLAVIX) 75 MG tablet Take 1 tablet (75 mg total) by mouth daily. 90 tablet 3   losartan (COZAAR) 50 MG tablet Take 1 tablet (50 mg total) by mouth daily. 90 tablet 3   metoprolol succinate (TOPROL-XL) 25 MG 24 hr tablet Take 1 tablet (25 mg total) by mouth daily. 90 tablet 3   nitroGLYCERIN (NITROSTAT) 0.4 MG SL tablet Place 1 tablet (0.4 mg total) under the tongue every 5 (five) minutes x 3 doses as needed for chest pain. 25 tablet 4   NONFORMULARY OR COMPOUNDED ITEM Triamcinolone  0.1%/Eucerin. Unknown ratio.1 application topically daily as directed. (Patient taking differently: Triamcinolone 0.1%/Eucerin. Unknown ratio.1 application topically daily as needed for eczema) 1 each 5   rosuvastatin (CRESTOR) 40 MG tablet Take 1 tablet (40 mg total) by mouth daily. 90 tablet 1   triamcinolone (KENALOG) 0.025 % ointment Apply 1 Application topically 2 (two) times daily. 30 g 0   triamcinolone 0.1%-Eucerin equivalent 1:1 cream mixture Apply to affected area topically once daily. 240 g 4   No current facility-administered medications for this visit.    Allergies:   Patient has no known allergies.    Social History:  The patient  reports that he quit smoking about 46 years ago. His smoking use included cigarettes. He has never used smokeless tobacco. He reports that he does not drink alcohol and does not use drugs.   Family History:  The patient's family history includes Cancer in his father, maternal aunt, maternal uncle, maternal uncle, paternal uncle, and paternal uncle; Coronary artery disease in his father; Heart attack in his sister; Heart disease in his father, mother, and paternal uncle; Heart failure in his mother; Hypertension in his sister.    ROS:  Please see the history of present illness.   Otherwise, review of systems are positive for none.   All other systems are reviewed and negative.    PHYSICAL EXAM: VS:  BP 110/70 (BP Location: Left Arm, Cuff Size: Large)   Pulse 68   Ht 5\' 9"  (1.753 m)   Wt 203 lb (92.1 kg)   SpO2 99%   BMI 29.98 kg/m  , BMI Body mass index is 29.98 kg/m. GEN: Well nourished, well developed, in no acute distress HEENT: normal Neck: no JVD, carotid bruits, or masses Cardiac: RRR; no murmurs, rubs, or gallops,no edema. Pacer site is OK. Respiratory:  clear to auscultation bilaterally, normal work of breathing GI: soft, nontender, nondistended, + BS MS: no deformity or atrophy Skin: warm and dry, no rash Neuro:  Strength and  sensation are intact Psych: euthymic mood, full affect     Recent Labs: 07/13/2022: ALT 20; BUN 11; Creatinine, Ser 1.11; Potassium 4.2; Sodium 137    Lipid Panel    Component Value Date/Time   CHOL 80 (L) 07/13/2022 0825   TRIG 65 07/13/2022 0825   HDL 42 07/13/2022 0825   CHOLHDL 1.9 07/13/2022 0825   CHOLHDL 5.6 01/07/2022 0215   VLDL 29 01/07/2022 0215   LDLCALC 23 07/13/2022 0825      Wt Readings from Last 3 Encounters:  05/23/23 203 lb (92.1 kg)  11/21/22 200 lb 12.8 oz (91.1 kg)  05/24/22 194 lb (88 kg)      Other studies Reviewed: Additional studies/ records that were reviewed today include:  IMPRESSIONS     1. Patient in complete heart block during study Small area of apical  akinesis /dyskinesis . Left ventricular ejection fraction, by estimation,  is 55%. The left ventricle has normal function. The left ventricle  demonstrates regional wall motion  abnormalities (see scoring diagram/findings for description). There is  mild left ventricular hypertrophy. Left ventricular diastolic parameters  are indeterminate.   2. Right ventricular systolic function is normal. The right ventricular  size is normal.   3. The mitral valve is normal in structure. No evidence of mitral valve  regurgitation. No evidence of mitral stenosis.   4. The aortic valve is tricuspid. There is mild calcification of the  aortic valve. Aortic valve regurgitation is not visualized. Aortic valve  sclerosis is present, with no evidence of aortic valve stenosis.   5. The inferior vena cava is normal in size with greater than 50%  respiratory variability, suggesting right atrial pressure of 3 mmHg.   Cardiac cath:  LEFT HEART CATH AND CORONARY ANGIOGRAPHY   Conclusion  Conclusions: Severe three-vessel coronary artery disease, as detailed below. Mildly elevated left ventricular filling pressure.   Recommendations: Cardiac MRI for myocardial viability with attention on LAD  territory. Cardiac surgery consultation and Heart Team evaluation to discuss optimal revascularization strategy. Follow-up EP recommendations. Aggressive secondary prevention of coronary artery disease.   [name], MD [facility]  Coronary Diagrams  Diagnostic Dominance: Right  Intervention   Cardiac MRI: CLINICAL DATA:  61M with HTN p/w 2:1 AV block, intermittent complete heart block. Echo 01/06/22 with EF 55% (apical akinesis/dyskinesis), normal RV function, no significant valvular disease. Cath 7/21 showed CTO mid LAD, 65% distal LM, 80% D1, 99% mid to distal LCX, 50% mid RCA, 85% RPDA. CMR to evaluate viability.   EXAM: CARDIAC MRI   TECHNIQUE: The patient was scanned on a 1.5 Tesla Siemens magnet. A dedicated cardiac coil was used. Functional imaging was done using Fiesta sequences. 2,3, and 4 chamber views were done to assess for RWMA's. Modified Simpson's rule using a short axis stack was used to calculate an ejection fraction on a dedicated work [employer]. The patient received 10 cc of Gadavist. After 10 minutes inversion recovery sequences were used to assess for infiltration and scar tissue.   CONTRAST:  10 cc  of Gadavist   FINDINGS: Left ventricle:   -Mild hypertrophy   -Normal size   -Normal global systolic function. Apical anterior akinesis. Small apical aneurysm   -Normal ECV (27%)   -Normal T2 values   -Subendocardial LGE consistent with prior infarct in mid to apical anterior wall and apex. LGE<50% transmural in mid anterior wall, suggesting viability. LGE >50% transmural in apical anterior wall and apex, suggesting nonviability in distal LAD territory.   LV EF: 56% (Normal 56-78%)   Absolute volumes:   LV EDV: (Normal 77-195 mL)   LV ESV: 48mL (Normal 19-72 mL)   LV SV: 60mL (Normal 51-133 mL)   CO: 3.8L/min (Normal 2.8-8.8 L/min)   Indexed volumes:   LV EDV: 23mL/sq-m (Normal 47-92 mL/sq-m)    LV ESV: 81mL/sq-m (Normal 13-30 mL/sq-m)   LV SV: 71mL/sq-m (Normal 32-62 mL/sq-m)   CI: 1.9L/min/sq-m (Normal 1.7-4.2 L/min/sq-m)   Right ventricle: Normal size and systolic function   RV EF:  55% (Normal 47-74%)   Absolute volumes:   RV EDV: (Normal 88-227 mL)   RV ESV: 50mL (Normal 23-103 mL)   RV SV: 61mL (Normal 52-138 mL)   CO: 3.9L/min (Normal 2.8-8.8 L/min)   Indexed volumes:   RV EDV: 71mL/sq-m (Normal 55-105 mL/sq-m)   RV ESV: 63mL/sq-m (Normal 15-43 mL/sq-m)   RV SV: 40mL/sq-m (  Normal 32-64 mL/sq-m)   CI: 1.9L/min/sq-m (Normal 1.7-4.2 L/min/sq-m)   Left atrium: Normal size   Right atrium: Normal size   Mitral valve: Trivial regurgitation   Aortic valve: Tricuspid. No regurgitation   Tricuspid valve: Trivial regurgitation   Pulmonic valve: No regurgitation   Aorta: Normal proximal ascending aorta   Pericardium: Normal   IMPRESSION: 1. 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 LGE is less than 50% transmural in mid anterior wall, suggesting viability. LGE is greater than 50% transmural in apical anterior wall and apex, suggesting nonviability in distal LAD territory.   2. Normal LV size, mild hypertrophy, and normal global systolic function (EF 56%). Apical anterior akinesis. Small apical aneurysm.   3.  Normal RV size and systolic function (EF 55%)     Electronically Signed   By: Epifanio Lesches M.D.   On: 01/09/2022 21:58  PCI 02/07/22:  CORONARY STENT INTERVENTION  CORONARY ATHERECTOMY  Intravascular Ultrasound/IVUS   Conclusion      Mid LM lesion is 25% stenosed.   Dist LM to Ost LAD lesion is 65% stenosed with 90% stenosed side branch in Ost Cx to Prox Cx.   Mid LAD lesion is 100% stenosed.   1st Diag lesion is 80% stenosed.   Dist Cx lesion is 99% stenosed.   2nd Mrg lesion is 70% stenosed.   A drug-eluting stent was successfully placed using  a SYNERGY XD 4.0X24.   A stent was successfully placed.   Post intervention, there is a 0% residual stenosis.   Post intervention, there is a 0% residual stenosis.   Post intervention, the side branch was reduced to 0% residual stenosis.   Successful PCI of the distal Left main into the ostial LCx using IVUS guidance with orbital atherectomy and DES x 1.    Plan: DAPT for a minimum of 6 months. If patient continues to have anginal symptoms he would be a candidate for dedicated CTO PCI of the LAD at a later date. Anticipate DC in am.   Intervention     PCI 02/07/22: Procedures  CORONARY STENT INTERVENTION  CORONARY ATHERECTOMY  Intravascular Ultrasound/IVUS   Conclusion      Mid LM lesion is 25% stenosed.   Dist LM to Ost LAD lesion is 65% stenosed with 90% stenosed side branch in Ost Cx to Prox Cx.   Mid LAD lesion is 100% stenosed.   1st Diag lesion is 80% stenosed.   Dist Cx lesion is 99% stenosed.   2nd Mrg lesion is 70% stenosed.   A drug-eluting stent was successfully placed using a SYNERGY XD 4.0X24.   A stent was successfully placed.   Post intervention, there is a 0% residual stenosis.   Post intervention, there is a 0% residual stenosis.   Post intervention, the side branch was reduced to 0% residual stenosis.   Successful PCI of the distal Left main into the ostial LCx using IVUS guidance with orbital atherectomy and DES x 1.    Plan: DAPT for a minimum of 6 months. If patient continues to have anginal symptoms he would be a candidate for dedicated CTO PCI of the LAD at a later date. Anticipate DC in am.   ntervention    Implants   ASSESSMENT AND PLAN:  1.  Complete heart block s/p pacemaker insertion. Pacer follow up OK>   2. CAD. Complex CAD with left main stenosis. Mid LAD occlusion, tight ostial LCx. Distal LCX  and PDA disease. Now s/p PCI of the left main into the LCx in August 2023. Really has not had angina up to this point so would likely not benefit  from PCI of the LAD. He did have chest pain this am. Ecg today is actually much better than last August with resolution of anterior T wave inversion. Will monitor for now and continue current Rx. Management of PDA and distal LCx disease is medication. Continue Plavix   3.  HTN. Elevated initially today but on my repeat was very good.   4. Hypercholesterolemia. Now on Crestor. Goal LDL <70. We increased Crestor in October last year. Repeat in Jan was excellent. Will update labs today  5. ? Afib noted on pacer check low burden. Will monitor. Defer anticoagulation for now unless burden increases.    Current medicines are reviewed at length with the patient today.  The patient does not have concerns regarding medicines.  The following changes have been made:  none  Labs/ tests ordered today include:   Orders Placed This Encounter  Procedures   CBC w/Diff/Platelet   Comprehensive Metabolic Panel (CMET)   Lipid panel   EKG 12-Lead         Disposition:   follow up with me in 6 months  Signed, Angeleen Horney [place], MD  05/23/2023 10:33 AM    [facility] [address] [place], [place], [address] Phone [phone], Fax [phone]

## 2023-05-23 ENCOUNTER — Telehealth: Payer: Self-pay | Admitting: Cardiology

## 2023-05-23 ENCOUNTER — Ambulatory Visit: Payer: Self-pay | Attending: Cardiology | Admitting: Cardiology

## 2023-05-23 ENCOUNTER — Encounter: Payer: Self-pay | Admitting: Cardiology

## 2023-05-23 VITALS — BP 110/70 | HR 68 | Ht 69.0 in | Wt 203.0 lb

## 2023-05-23 DIAGNOSIS — E785 Hyperlipidemia, unspecified: Secondary | ICD-10-CM

## 2023-05-23 DIAGNOSIS — Z79899 Other long term (current) drug therapy: Secondary | ICD-10-CM

## 2023-05-23 DIAGNOSIS — R079 Chest pain, unspecified: Secondary | ICD-10-CM

## 2023-05-23 DIAGNOSIS — I25118 Atherosclerotic heart disease of native coronary artery with other forms of angina pectoris: Secondary | ICD-10-CM

## 2023-05-23 DIAGNOSIS — I10 Essential (primary) hypertension: Secondary | ICD-10-CM

## 2023-05-23 NOTE — Telephone Encounter (Signed)
Attempted to contact patient. No answer,LMTCB 

## 2023-05-23 NOTE — Telephone Encounter (Signed)
Pt is having soreness at pacemaker site

## 2023-05-23 NOTE — Patient Instructions (Signed)
Medication Instructions:  Continue same medications *If you need a refill on your cardiac medications before your next appointment, please call your pharmacy*   Lab Work: Cbc,cmet,lipid panel today   Testing/Procedures: None ordered   Follow-Up: At [facility], you and your health needs are our priority.  As part of our continuing mission to provide you with exceptional heart care, we have created designated Provider Care Teams.  These Care Teams include your primary Cardiologist (physician) and Advanced Practice Providers (APPs -  Physician Assistants and Nurse Practitioners) who all work together to provide you with the care you need, when you need it.  We recommend signing up for the patient portal called "MyChart".  Sign up information is provided on this After Visit Summary.  MyChart is used to connect with patients for Virtual Visits (Telemedicine).  Patients are able to view lab/test results, encounter notes, upcoming appointments, etc.  Non-urgent messages can be sent to your provider as well.   To learn more about what you can do with MyChart, go to [email or web address].    Your next appointment:  6 months   Call in March to schedule June appointment     Provider:  Dr.Jordan

## 2023-05-23 NOTE — Telephone Encounter (Signed)
Patient reports of waking up this am with chest pain. Patient was seen by gen cards today, see ov note 05/23/23.   Patients transmission today reports 4 AT events with longest in duration <3 minutes. No other alerts noted on pacemaker. Pt has 1 year apt with EP MD 06/2023. Patient advised to keep apt and call if he has any further questions or concerns. Pt appreciative of call.

## 2023-05-23 NOTE — Telephone Encounter (Signed)
Pt returning nurse call. I told him she will call before 5 am.

## 2023-05-24 ENCOUNTER — Other Ambulatory Visit (HOSPITAL_COMMUNITY): Payer: Self-pay

## 2023-05-24 LAB — COMPREHENSIVE METABOLIC PANEL
ALT: 25 [IU]/L (ref 0–44)
AST: 22 [IU]/L (ref 0–40)
Albumin: 4.6 g/dL (ref 3.9–4.9)
Alkaline Phosphatase: 85 [IU]/L (ref 44–121)
BUN/Creatinine Ratio: 16 (ref 10–24)
BUN: 18 mg/dL (ref 8–27)
Bilirubin Total: 0.6 mg/dL (ref 0.0–1.2)
CO2: 24 mmol/L (ref 20–29)
Calcium: 9.1 mg/dL (ref 8.6–10.2)
Chloride: 103 mmol/L (ref 96–106)
Creatinine, Ser: 1.11 mg/dL (ref 0.76–1.27)
Globulin, Total: 2.4 g/dL (ref 1.5–4.5)
Glucose: 113 mg/dL — ABNORMAL HIGH (ref 70–99)
Potassium: 4.1 mmol/L (ref 3.5–5.2)
Sodium: 139 mmol/L (ref 134–144)
Total Protein: 7 g/dL (ref 6.0–8.5)
eGFR: 73 mL/min/{1.73_m2} (ref >59)

## 2023-05-24 LAB — CBC WITH DIFFERENTIAL/PLATELET
Basophils Absolute: 0.1 10*3/uL (ref 0.0–0.2)
Basos: 1 %
EOS (ABSOLUTE): 0.1 10*3/uL (ref 0.0–0.4)
Eos: 2 %
Hematocrit: 44.8 % (ref 37.5–51.0)
Hemoglobin: 14.5 g/dL (ref 13.0–17.7)
Immature Grans (Abs): 0 10*3/uL (ref 0.0–0.1)
Immature Granulocytes: 0 %
Lymphocytes Absolute: 0.9 10*3/uL (ref 0.7–3.1)
Lymphs: 17 %
MCH: 29.2 pg (ref 26.6–33.0)
MCHC: 32.4 g/dL (ref 31.5–35.7)
MCV: 90 fL (ref 79–97)
Monocytes Absolute: 0.4 10*3/uL (ref 0.1–0.9)
Monocytes: 7 %
Neutrophils Absolute: 3.7 10*3/uL (ref 1.4–7.0)
Neutrophils: 73 %
Platelets: 207 10*3/uL (ref 150–450)
RBC: 4.96 x10E6/uL (ref 4.14–5.80)
RDW: 12.7 % (ref 11.6–15.4)
WBC: 5.1 10*3/uL (ref 3.4–10.8)

## 2023-05-24 LAB — LIPID PANEL
Chol/HDL Ratio: 2.6 {ratio} (ref 0.0–5.0)
Cholesterol, Total: 98 mg/dL — ABNORMAL LOW (ref 100–199)
HDL: 37 mg/dL — ABNORMAL LOW (ref >39)
LDL Chol Calc (NIH): 44 mg/dL (ref 0–99)
Triglycerides: 83 mg/dL (ref 0–149)
VLDL Cholesterol Cal: 17 mg/dL (ref 5–40)

## 2023-05-25 ENCOUNTER — Ambulatory Visit: Payer: Self-pay | Admitting: Cardiology

## 2023-06-04 ENCOUNTER — Other Ambulatory Visit (HOSPITAL_COMMUNITY): Payer: Self-pay

## 2023-06-22 ENCOUNTER — Ambulatory Visit: Payer: Self-pay | Admitting: Cardiology

## 2023-06-27 ENCOUNTER — Other Ambulatory Visit (HOSPITAL_COMMUNITY): Payer: Self-pay

## 2023-07-09 ENCOUNTER — Other Ambulatory Visit: Payer: Self-pay | Admitting: Emergency Medicine

## 2023-07-09 ENCOUNTER — Other Ambulatory Visit (HOSPITAL_COMMUNITY): Payer: Self-pay

## 2023-07-09 NOTE — Telephone Encounter (Signed)
Copied from CRM [id number]. Topic: Clinical - Medication Refill >> Jul 09, 2023  1:04 PM Sonny Dandy B wrote: Most Recent Primary Care Visit:  Provider: Georgina Quint  Department: [facility]  Visit Type: OFFICE VISIT  Date: 10/18/2021  Medication: triamcinolone 0.1%-Eucerin equivalent 1:1 cream mixture   Has the patient contacted their pharmacy? Yes (Agent: If no, request that the patient contact the pharmacy for the refill. If patient does not wish to contact the pharmacy document the reason why and proceed with request.) (Agent: If yes, when and what did the pharmacy advise?)  Is this the correct pharmacy for this prescription? Yes If no, delete pharmacy and type the correct one.  This is the patient's preferred pharmacy:  [place] [facility] [address] [place] [address] Phone: [phone] Fax: [phone]   Has the prescription been filled recently? Yes  Is the patient out of the medication? Yes  Has the patient been seen for an appointment in the last year OR does the patient have an upcoming appointment? Yes  Can we respond through MyChart? Yes  Agent: Please be advised that Rx refills may take up to 3 business days. We ask that you follow-up with your pharmacy.

## 2023-07-10 ENCOUNTER — Other Ambulatory Visit (HOSPITAL_COMMUNITY): Payer: Self-pay

## 2023-07-10 ENCOUNTER — Other Ambulatory Visit: Payer: Self-pay | Admitting: Emergency Medicine

## 2023-07-10 MED ORDER — TRIAMCINOLONE ACETONIDE 0.025 % EX OINT
1.0000 | TOPICAL_OINTMENT | Freq: Two times a day (BID) | CUTANEOUS | 0 refills | Status: AC
  Filled 2023-07-10: qty 30, 30d supply, fill #0

## 2023-07-11 NOTE — Progress Notes (Unsigned)
  Electrophysiology Office Note:   ID:  Colin, Pena 1955-11-09, MRN [id number]  Primary Cardiologist: Olga Millers, MD Electrophysiologist: Lanier Prude, MD  {Click to update primary MD,subspecialty MD or APP then REFRESH:1}    History of Present Illness:   Colin Pena is a 68 y.o. male with h/o CAD, CHB s/p PPM, and HTN seen today for routine electrophysiology followup.   Since last being seen in our clinic the patient reports doing ***.  he denies chest pain, palpitations, dyspnea, PND, orthopnea, nausea, vomiting, dizziness, syncope, edema, weight gain, or early satiety.   Review of systems complete and found to be negative unless listed in HPI.   EP Information / Studies Reviewed:    EKG is not ordered today. EKG from 05/23/2023 reviewed which showed A pacing /NSR at 60 bpm       PPM Interrogation-  reviewed in detail today,  See PACEART report.  Device History: Abbott Dual Chamber PPM implanted 12/2021 for CHB  cMRI 12/2021 EF 56%, LGE with prior infarct in mid to apical anterior wall and apex  Physical Exam:   VS:  There were no vitals taken for this visit.   Wt Readings from Last 3 Encounters:  05/23/23 203 lb (92.1 kg)  11/21/22 200 lb 12.8 oz (91.1 kg)  05/24/22 194 lb (88 kg)     GEN: No acute distress  NECK: No JVD; No carotid bruits CARDIAC: {EPRHYTHM:28826}, no murmurs, rubs, gallops RESPIRATORY:  Clear to auscultation without rales, wheezing or rhonchi  ABDOMEN: Soft, non-tender, non-distended EXTREMITIES:  {EDEMA LEVEL:28147::"No"} edema; No deformity   ASSESSMENT AND PLAN:    CHB s/p Abbott PPM  Normal PPM function See Pace Art report No changes today  CAD Denies s/s ischemia  HTN Stable on current regimen   {Click here to Review PMH, Prob List, Meds, Allergies, SHx, FHx  :1}   Disposition:   Follow up with {EPPROVIDERS:28135} {EPFOLLOW UP:28173}  Signed, Graciella Freer, PA-C

## 2023-07-12 ENCOUNTER — Encounter: Payer: Self-pay | Admitting: Student

## 2023-07-12 ENCOUNTER — Ambulatory Visit: Payer: Self-pay

## 2023-07-12 ENCOUNTER — Encounter: Payer: Self-pay | Admitting: Cardiology

## 2023-07-12 ENCOUNTER — Ambulatory Visit: Payer: Self-pay | Attending: Student | Admitting: Student

## 2023-07-12 ENCOUNTER — Other Ambulatory Visit (HOSPITAL_COMMUNITY): Payer: Self-pay

## 2023-07-12 VITALS — BP 172/90 | HR 66 | Ht 69.0 in | Wt 207.0 lb

## 2023-07-12 DIAGNOSIS — I10 Essential (primary) hypertension: Secondary | ICD-10-CM

## 2023-07-12 DIAGNOSIS — Z950 Presence of cardiac pacemaker: Secondary | ICD-10-CM

## 2023-07-12 DIAGNOSIS — I442 Atrioventricular block, complete: Secondary | ICD-10-CM

## 2023-07-12 DIAGNOSIS — I25118 Atherosclerotic heart disease of native coronary artery with other forms of angina pectoris: Secondary | ICD-10-CM

## 2023-07-12 LAB — CUP PACEART INCLINIC DEVICE CHECK
Battery Remaining Longevity: 111 mo
Battery Voltage: 3.01 V
Brady Statistic RA Percent Paced: 6.5 %
Brady Statistic RV Percent Paced: 69 %
Date Time Interrogation Session: 20250123090746
Implantable Lead Connection Status: 753985
Implantable Lead Connection Status: 753985
Implantable Lead Implant Date: 20230725
Implantable Lead Implant Date: 20230725
Implantable Lead Location: 753859
Implantable Lead Location: 753860
Implantable Pulse Generator Implant Date: 20230725
Lead Channel Impedance Value: 450 Ohm
Lead Channel Impedance Value: 512.5 Ohm
Lead Channel Pacing Threshold Amplitude: 0.625 V
Lead Channel Pacing Threshold Amplitude: 0.75 V
Lead Channel Pacing Threshold Amplitude: 0.75 V
Lead Channel Pacing Threshold Pulse Width: 0.5 ms
Lead Channel Pacing Threshold Pulse Width: 0.5 ms
Lead Channel Pacing Threshold Pulse Width: 0.5 ms
Lead Channel Sensing Intrinsic Amplitude: 2.6 mV
Lead Channel Sensing Intrinsic Amplitude: 8.2 mV
Lead Channel Setting Pacing Amplitude: 0.875
Lead Channel Setting Pacing Amplitude: 1.5 V
Lead Channel Setting Pacing Pulse Width: 0.5 ms
Lead Channel Setting Sensing Sensitivity: 2 mV
Pulse Gen Model: 2272
Pulse Gen Serial Number: 8100788

## 2023-07-12 MED ORDER — METOPROLOL SUCCINATE ER 50 MG PO TB24
50.0000 mg | ORAL_TABLET | Freq: Every day | ORAL | 3 refills | Status: DC
  Filled 2023-07-12: qty 90, 90d supply, fill #0
  Filled 2023-10-08: qty 90, 90d supply, fill #1

## 2023-07-12 NOTE — Patient Instructions (Signed)
Medication Instructions:  Increase metoprolol succinate (Toprol XL) to 50 mg at bedtime starting tomorrow night. *If you need a refill on your cardiac medications before your next appointment, please call your pharmacy*  Lab Work: None ordered If you have labs (blood work) drawn today and your tests are completely normal, you will receive your results only by: MyChart Message (if you have MyChart) OR A paper copy in the mail If you have any lab test that is abnormal or we need to change your treatment, we will call you to review the results.  Follow-Up: At [facility], you and your health needs are our priority.  As part of our continuing mission to provide you with exceptional heart care, we have created designated Provider Care Teams.  These Care Teams include your primary Cardiologist (physician) and Advanced Practice Providers (APPs -  Physician Assistants and Nurse Practitioners) who all work together to provide you with the care you need, when you need it.  Your next appointment:   1 year(s)  Provider:   Steffanie Dunn, MD

## 2023-07-13 LAB — CUP PACEART REMOTE DEVICE CHECK
Battery Remaining Longevity: 118 mo
Battery Remaining Percentage: 89 %
Battery Voltage: 3.01 V
Brady Statistic AP VP Percent: 1 %
Brady Statistic AP VS Percent: 11 %
Brady Statistic AS VP Percent: 1 %
Brady Statistic AS VS Percent: 88 %
Brady Statistic RA Percent Paced: 11 %
Brady Statistic RV Percent Paced: 1.1 %
Date Time Interrogation Session: 20250123230250
Implantable Lead Connection Status: 753985
Implantable Lead Connection Status: 753985
Implantable Lead Implant Date: 20230725
Implantable Lead Implant Date: 20230725
Implantable Lead Location: 753859
Implantable Lead Location: 753860
Implantable Pulse Generator Implant Date: 20230725
Lead Channel Impedance Value: 440 Ohm
Lead Channel Impedance Value: 480 Ohm
Lead Channel Pacing Threshold Amplitude: 0.75 V
Lead Channel Pacing Threshold Amplitude: 0.75 V
Lead Channel Pacing Threshold Pulse Width: 0.5 ms
Lead Channel Pacing Threshold Pulse Width: 0.5 ms
Lead Channel Sensing Intrinsic Amplitude: 2.6 mV
Lead Channel Sensing Intrinsic Amplitude: 7.7 mV
Lead Channel Setting Pacing Amplitude: 1 V
Lead Channel Setting Pacing Amplitude: 1.5 V
Lead Channel Setting Pacing Pulse Width: 0.5 ms
Lead Channel Setting Sensing Sensitivity: 2 mV
Pulse Gen Model: 2272
Pulse Gen Serial Number: 8100788

## 2023-07-15 ENCOUNTER — Encounter: Payer: Self-pay | Admitting: Cardiology

## 2023-08-02 ENCOUNTER — Other Ambulatory Visit (HOSPITAL_COMMUNITY): Payer: Self-pay

## 2023-08-20 ENCOUNTER — Other Ambulatory Visit (HOSPITAL_COMMUNITY): Payer: Self-pay

## 2023-08-20 MED ORDER — AMOXICILLIN-POT CLAVULANATE 875-125 MG PO TABS
875.0000 mg | ORAL_TABLET | Freq: Two times a day (BID) | ORAL | 0 refills | Status: AC
  Filled 2023-08-20: qty 14, 7d supply, fill #0

## 2023-08-23 NOTE — Addendum Note (Signed)
 Addended by: Elease Etienne A on: 08/23/2023 02:45 PM   Modules accepted: Orders

## 2023-08-23 NOTE — Progress Notes (Signed)
 Remote pacemaker transmission.

## 2023-08-29 ENCOUNTER — Other Ambulatory Visit (HOSPITAL_COMMUNITY): Payer: Self-pay

## 2023-09-04 ENCOUNTER — Other Ambulatory Visit (HOSPITAL_COMMUNITY): Payer: Self-pay

## 2023-10-08 ENCOUNTER — Other Ambulatory Visit: Payer: Self-pay | Admitting: Cardiology

## 2023-10-08 ENCOUNTER — Telehealth: Payer: Self-pay | Admitting: Cardiology

## 2023-10-08 ENCOUNTER — Other Ambulatory Visit (HOSPITAL_COMMUNITY): Payer: Self-pay

## 2023-10-08 MED ORDER — ROSUVASTATIN CALCIUM 40 MG PO TABS
40.0000 mg | ORAL_TABLET | Freq: Every day | ORAL | 2 refills | Status: DC
  Filled 2023-10-08: qty 90, 90d supply, fill #0

## 2023-10-08 NOTE — Telephone Encounter (Signed)
*  STAT* If patient is at the pharmacy, call can be transferred to refill team.   1. Which medications need to be refilled? (please list name of each medication and dose if known)   rosuvastatin  (CRESTOR ) 40 MG tablet  Take 1 tablet (40 mg total) by mouth daily.    2. Would you like to learn more about the convenience, safety, & potential cost savings by using the [facility]? No   3. Are you open to using the [facility] No   4. Which pharmacy/location (including street and city if local pharmacy) is medication to be sent to? [place] [facility]     5. Do they need a 30 day or 90 day supply? 90 Day Supply

## 2023-10-08 NOTE — Telephone Encounter (Signed)
 RX sent to requested Pharmacy

## 2023-10-11 ENCOUNTER — Ambulatory Visit (INDEPENDENT_AMBULATORY_CARE_PROVIDER_SITE_OTHER): Payer: Self-pay

## 2023-10-11 DIAGNOSIS — I442 Atrioventricular block, complete: Secondary | ICD-10-CM

## 2023-10-11 LAB — CUP PACEART REMOTE DEVICE CHECK
Battery Remaining Longevity: 113 mo
Battery Remaining Percentage: 87 %
Battery Voltage: 3.01 V
Brady Statistic AP VP Percent: 1 %
Brady Statistic AP VS Percent: 2.4 %
Brady Statistic AS VP Percent: 8.1 %
Brady Statistic AS VS Percent: 89 %
Brady Statistic RA Percent Paced: 2.3 %
Brady Statistic RV Percent Paced: 8.1 %
Date Time Interrogation Session: 20250424040023
Implantable Lead Connection Status: 753985
Implantable Lead Connection Status: 753985
Implantable Lead Implant Date: 20230725
Implantable Lead Implant Date: 20230725
Implantable Lead Location: 753859
Implantable Lead Location: 753860
Implantable Pulse Generator Implant Date: 20230725
Lead Channel Impedance Value: 450 Ohm
Lead Channel Impedance Value: 490 Ohm
Lead Channel Pacing Threshold Amplitude: 0.75 V
Lead Channel Pacing Threshold Amplitude: 0.75 V
Lead Channel Pacing Threshold Pulse Width: 0.5 ms
Lead Channel Pacing Threshold Pulse Width: 0.5 ms
Lead Channel Sensing Intrinsic Amplitude: 2.3 mV
Lead Channel Sensing Intrinsic Amplitude: 9.6 mV
Lead Channel Setting Pacing Amplitude: 1 V
Lead Channel Setting Pacing Amplitude: 1.5 V
Lead Channel Setting Pacing Pulse Width: 0.5 ms
Lead Channel Setting Sensing Sensitivity: 2 mV
Pulse Gen Model: 2272
Pulse Gen Serial Number: 8100788

## 2023-10-13 ENCOUNTER — Encounter: Payer: Self-pay | Admitting: Cardiology

## 2023-11-05 ENCOUNTER — Other Ambulatory Visit (HOSPITAL_COMMUNITY): Payer: Self-pay

## 2023-11-13 ENCOUNTER — Other Ambulatory Visit: Payer: Self-pay | Admitting: Physician Assistant

## 2023-11-13 ENCOUNTER — Other Ambulatory Visit (HOSPITAL_COMMUNITY): Payer: Self-pay

## 2023-11-13 MED ORDER — LOSARTAN POTASSIUM 50 MG PO TABS
50.0000 mg | ORAL_TABLET | Freq: Every day | ORAL | 0 refills | Status: DC
  Filled 2023-11-13: qty 30, 30d supply, fill #0

## 2023-11-21 NOTE — Progress Notes (Signed)
 Remote pacemaker transmission.

## 2023-12-12 ENCOUNTER — Other Ambulatory Visit (HOSPITAL_COMMUNITY): Payer: Self-pay

## 2023-12-12 ENCOUNTER — Other Ambulatory Visit: Payer: Self-pay | Admitting: Cardiology

## 2023-12-13 ENCOUNTER — Telehealth: Payer: Self-pay | Admitting: Cardiology

## 2023-12-13 ENCOUNTER — Other Ambulatory Visit (HOSPITAL_COMMUNITY): Payer: Self-pay

## 2023-12-13 MED ORDER — AMLODIPINE BESYLATE 10 MG PO TABS
10.0000 mg | ORAL_TABLET | Freq: Every day | ORAL | 1 refills | Status: DC
  Filled 2023-12-13: qty 90, 90d supply, fill #0

## 2023-12-13 NOTE — Telephone Encounter (Signed)
 Pt's medication was sent to pt's pharmacy as requested. Confirmation received.

## 2023-12-13 NOTE — Telephone Encounter (Signed)
*  STAT* If patient is at the pharmacy, call can be transferred to refill team.   1. Which medications need to be refilled? (please list name of each medication and dose if known) amLODipine  (NORVASC ) 10 MG tablet    2. Would you like to learn more about the convenience, safety, & potential cost savings by using the [facility]?      3. Are you open to using the [facility] (Type [facility]. ).   4. Which pharmacy/location (including street and city if local pharmacy) is medication to be sent to? [place] [facility]    5. Do they need a 30 day or 90 day supply? 90 day

## 2023-12-13 NOTE — Progress Notes (Signed)
 Cardiology Office Note   Date:  12/19/2023   ID:  Colin Pena Jul 22, 1955, MRN [id number]  PCP:  Purcell Emil Schanz, MD  Cardiologist:  Redell Shallow MD   Chief Complaint  Patient presents with   Coronary Artery Disease      History of Present Illness: Colin Pena is a 68 y.o. male who is seen for follow up CAD. He presented on 01/06/22 with intermittent symptoms of near syncope, acute dyspnea and fatigue. He was found to be in complete heart block. Echo showed apical akinesis/dyskinesis with EF 55%. Due to wall motion abnormality  cardiac cath was performed showing CTO of the mid LAD. There was moderate distal left main disease of 65%. The ostial LCx had 90% stenosis. There was some distal vessel disease as well. The PDA had an 85% lesion in the mid vessel. MRI was done for viability showing EF 56%. There was late gadolinium enhancement in the myocardium suggesting substantial viability in the anterior wall. He was seen in consultation with Dr Fleeta Ochoa who felt that the LAD was not graftable. Consideration given to treating the non LAD disease with PCI. The patient underwent placement of dual chamber pacemaker on 01/10/22.   He subsequently underwent PCI with  atherectomy and stenting of the left main into the LCx on 02/07/22.   On follow up today he is doing very well. Notes he can now go up 3 flights of stairs without getting out of breath. No chest pain. BP well controlled. Tolerating medication well.     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  01/11/2022    Past Surgical History:  Procedure Laterality Date   CORONARY ATHERECTOMY N/A 02/07/2022   Procedure: CORONARY ATHERECTOMY;  Surgeon: [place], Chardai Gangemi M, MD;  Location: [facility] INVASIVE CV LAB;  Service: Cardiovascular;  Laterality: N/A;   CORONARY STENT INTERVENTION N/A  02/07/2022   Procedure: CORONARY STENT INTERVENTION;  Surgeon: [place], Lilyrose Tanney M, MD;  Location: [facility] INVASIVE CV LAB;  Service: Cardiovascular;  Laterality: N/A;   CORONARY ULTRASOUND/IVUS N/A 02/07/2022   Procedure: Intravascular Ultrasound/IVUS;  Surgeon: [place], Sheikh Leverich M, MD;  Location: [facility] INVASIVE CV LAB;  Service: Cardiovascular;  Laterality: N/A;   LEFT HEART CATH AND CORONARY ANGIOGRAPHY N/A 01/06/2022   Procedure: LEFT HEART CATH AND CORONARY ANGIOGRAPHY;  Surgeon: Mady Bruckner, MD;  Location: [facility] INVASIVE CV LAB;  Service: Cardiovascular;  Laterality: N/A;   PACEMAKER IMPLANT N/A 01/10/2022   Procedure: PACEMAKER IMPLANT;  Surgeon: Cindie Ole DASEN, MD;  Location: [facility] INVASIVE CV LAB;  Service: Cardiovascular;  Laterality: N/A;     Current Outpatient Medications  Medication Sig Dispense Refill   acetaminophen  (TYLENOL ) 325 MG tablet Take 2 tablets (650 mg total) by mouth every 4 (four) hours as needed for headache or mild pain. (Patient taking differently: Take 325 mg by mouth every 4 (four) hours as needed for headache or mild pain (pain score 1-3).)     nitroGLYCERIN  (NITROSTAT ) 0.4 MG SL tablet Place 1 tablet (0.4 mg total) under the tongue every 5 (five) minutes x 3 doses as needed for chest pain. 25 tablet 4   NONFORMULARY OR COMPOUNDED ITEM Triamcinolone  0.1%/Eucerin. Unknown ratio.1 application topically daily as directed. (Patient taking differently: Triamcinolone  0.1%/Eucerin. Unknown ratio.1 application topically daily as needed for eczema) 1 each 5   triamcinolone  (KENALOG )  0.025 % ointment Apply 1 Application topically 2 (two) times daily. 30 g 0   triamcinolone  0.1%-Eucerin equivalent 1:1 cream mixture Apply to affected area topically once daily. 240 g 4   amLODipine  (NORVASC ) 10 MG tablet Take 1 tablet (10 mg total) by mouth daily. 90 tablet 3   clopidogrel  (PLAVIX ) 75 MG tablet Take 1 tablet (75 mg total) by mouth daily. 90 tablet 3   losartan  (COZAAR ) 50 MG tablet Take 1  tablet (50 mg total) by mouth daily. 90 tablet 3   metoprolol  succinate (TOPROL -XL) 50 MG 24 hr tablet Take 1 tablet (50 mg total) by mouth at bedtime. Take with or immediately following a meal. 90 tablet 3   rosuvastatin  (CRESTOR ) 40 MG tablet Take 1 tablet (40 mg total) by mouth daily. 90 tablet 3   No current facility-administered medications for this visit.    Allergies:   Patient has no known allergies.    Social History:  The patient  reports that he quit smoking about 47 years ago. His smoking use included cigarettes. He has never used smokeless tobacco. He reports that he does not drink alcohol and does not use drugs.   Family History:  The patient's family history includes Cancer in his father, maternal aunt, maternal uncle, maternal uncle, paternal uncle, and paternal uncle; Coronary artery disease in his father; Heart attack in his sister; Heart disease in his father, mother, and paternal uncle; Heart failure in his mother; Hypertension in his sister.    ROS:  Please see the history of present illness.   Otherwise, review of systems are positive for none.   All other systems are reviewed and negative.    PHYSICAL EXAM: VS:  BP 132/78 (BP Location: Left Arm, Patient Position: Sitting, Cuff Size: Normal)   Pulse 67   Ht 5' 9 (1.753 m)   Wt 206 lb (93.4 kg)   SpO2 95%   BMI 30.42 kg/m  , BMI Body mass index is 30.42 kg/m. GEN: Well nourished, well developed, in no acute distress HEENT: normal Neck: no JVD, carotid bruits, or masses Cardiac: RRR; no murmurs, rubs, or gallops,no edema. Pacer site is OK. Respiratory:  clear to auscultation bilaterally, normal work of breathing GI: soft, nontender, nondistended, + BS MS: no deformity or atrophy Skin: warm and dry, no rash Neuro:  Strength and sensation are intact Psych: euthymic mood, full affect     Recent Labs: 05/23/2023: ALT 25; BUN 18; Creatinine, Ser 1.11; Hemoglobin 14.5; Platelets 207; Potassium 4.1; Sodium 139     Lipid Panel    Component Value Date/Time   CHOL 98 (L) 05/23/2023 1041   TRIG 83 05/23/2023 1041   HDL 37 (L) 05/23/2023 1041   CHOLHDL 2.6 05/23/2023 1041   CHOLHDL 5.6 01/07/2022 0215   VLDL 29 01/07/2022 0215   LDLCALC 44 05/23/2023 1041      Wt Readings from Last 3 Encounters:  12/19/23 206 lb (93.4 kg)  07/12/23 207 lb (93.9 kg)  05/23/23 203 lb (92.1 kg)      Other studies Reviewed: Additional studies/ records that were reviewed today include:  IMPRESSIONS     1. Patient in complete heart block during study Small area of apical  akinesis /dyskinesis . Left ventricular ejection fraction, by estimation,  is 55%. The left ventricle has normal function. The left ventricle  demonstrates regional wall motion  abnormalities (see scoring diagram/findings for description). There is  mild left ventricular hypertrophy. Left ventricular diastolic parameters  are indeterminate.  2. Right ventricular systolic function is normal. The right ventricular  size is normal.   3. The mitral valve is normal in structure. No evidence of mitral valve  regurgitation. No evidence of mitral stenosis.   4. The aortic valve is tricuspid. There is mild calcification of the  aortic valve. Aortic valve regurgitation is not visualized. Aortic valve  sclerosis is present, with no evidence of aortic valve stenosis.   5. The inferior vena cava is normal in size with greater than 50%  respiratory variability, suggesting right atrial pressure of 3 mmHg.   Cardiac cath:  LEFT HEART CATH AND CORONARY ANGIOGRAPHY   Conclusion  Conclusions: Severe three-vessel coronary artery disease, as detailed below. Mildly elevated left ventricular filling pressure.   Recommendations: Cardiac MRI for myocardial viability with attention on LAD territory. Cardiac surgery consultation and Heart Team evaluation to discuss optimal revascularization strategy. Follow-up EP recommendations. Aggressive secondary  prevention of coronary artery disease.   Lonni Hanson, MD [facility]  Coronary Diagrams  Diagnostic Dominance: Right  Intervention   Cardiac MRI: CLINICAL DATA:  17M with HTN p/w 2:1 AV block, intermittent complete heart block. Echo 01/06/22 with EF 55% (apical akinesis/dyskinesis), normal RV function, no significant valvular disease. Cath 7/21 showed CTO mid LAD, 65% distal LM, 80% D1, 99% mid to distal LCX, 50% mid RCA, 85% RPDA. CMR to evaluate viability.   EXAM: CARDIAC MRI   TECHNIQUE: The patient was scanned on a 1.5 Tesla Siemens magnet. A dedicated cardiac coil was used. Functional imaging was done using Fiesta sequences. 2,3, and 4 chamber views were done to assess for RWMA's. Modified Simpson's rule using a short axis stack was used to calculate an ejection fraction on a dedicated work [employer]. The patient received 10 cc of Gadavist . After 10 minutes inversion recovery sequences were used to assess for infiltration and scar tissue.   CONTRAST:  10 cc  of Gadavist    FINDINGS: Left ventricle:   -Mild hypertrophy   -Normal size   -Normal global systolic function. Apical anterior akinesis. Small apical aneurysm   -Normal ECV (27%)   -Normal T2 values   -Subendocardial LGE consistent with prior infarct in mid to apical anterior wall and apex. LGE<50% transmural in mid anterior wall, suggesting viability. LGE >50% transmural in apical anterior wall and apex, suggesting nonviability in distal LAD territory.   LV EF: 56% (Normal 56-78%)   Absolute volumes:   LV EDV: (Normal 77-195 mL)   LV ESV: 48mL (Normal 19-72 mL)   LV SV: 60mL (Normal 51-133 mL)   CO: 3.8L/min (Normal 2.8-8.8 L/min)   Indexed volumes:   LV EDV: 92mL/sq-m (Normal 47-92 mL/sq-m)   LV ESV: 42mL/sq-m (Normal 13-30 mL/sq-m)   LV SV: 67mL/sq-m (Normal 32-62 mL/sq-m)   CI: 1.9L/min/sq-m (Normal 1.7-4.2 L/min/sq-m)   Right ventricle: Normal size  and systolic function   RV EF:  55% (Normal 47-74%)   Absolute volumes:   RV EDV: (Normal 88-227 mL)   RV ESV: 50mL (Normal 23-103 mL)   RV SV: 61mL (Normal 52-138 mL)   CO: 3.9L/min (Normal 2.8-8.8 L/min)   Indexed volumes:   RV EDV: 70mL/sq-m (Normal 55-105 mL/sq-m)   RV ESV: 19mL/sq-m (Normal 15-43 mL/sq-m)   RV SV: 68mL/sq-m (Normal 32-64 mL/sq-m)   CI: 1.9L/min/sq-m (Normal 1.7-4.2 L/min/sq-m)   Left atrium: Normal size   Right atrium: Normal size   Mitral valve: Trivial regurgitation   Aortic valve: Tricuspid. No regurgitation   Tricuspid valve:  Trivial regurgitation   Pulmonic valve: No regurgitation   Aorta: Normal proximal ascending aorta   Pericardium: Normal   IMPRESSION: 1. 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 LGE is less than 50% transmural in mid anterior wall, suggesting viability. LGE is greater than 50% transmural in apical anterior wall and apex, suggesting nonviability in distal LAD territory.   2. Normal LV size, mild hypertrophy, and normal global systolic function (EF 56%). Apical anterior akinesis. Small apical aneurysm.   3.  Normal RV size and systolic function (EF 55%)     Electronically Signed   By: Lonni Nanas M.D.   On: 01/09/2022 21:58  PCI 02/07/22:  CORONARY STENT INTERVENTION  CORONARY ATHERECTOMY  Intravascular Ultrasound/IVUS   Conclusion      Mid LM lesion is 25% stenosed.   Dist LM to Ost LAD lesion is 65% stenosed with 90% stenosed side branch in Ost Cx to Prox Cx.   Mid LAD lesion is 100% stenosed.   1st Diag lesion is 80% stenosed.   Dist Cx lesion is 99% stenosed.   2nd Mrg lesion is 70% stenosed.   A drug-eluting stent was successfully placed using a SYNERGY XD 4.0X24.   A stent was successfully placed.   Post intervention, there is a 0% residual stenosis.   Post intervention, there is a 0% residual stenosis.    Post intervention, the side branch was reduced to 0% residual stenosis.   Successful PCI of the distal Left main into the ostial LCx using IVUS guidance with orbital atherectomy and DES x 1.    Plan: DAPT for a minimum of 6 months. If patient continues to have anginal symptoms he would be a candidate for dedicated CTO PCI of the LAD at a later date. Anticipate DC in am.   Intervention     PCI 02/07/22: Procedures  CORONARY STENT INTERVENTION  CORONARY ATHERECTOMY  Intravascular Ultrasound/IVUS   Conclusion      Mid LM lesion is 25% stenosed.   Dist LM to Ost LAD lesion is 65% stenosed with 90% stenosed side branch in Ost Cx to Prox Cx.   Mid LAD lesion is 100% stenosed.   1st Diag lesion is 80% stenosed.   Dist Cx lesion is 99% stenosed.   2nd Mrg lesion is 70% stenosed.   A drug-eluting stent was successfully placed using a SYNERGY XD 4.0X24.   A stent was successfully placed.   Post intervention, there is a 0% residual stenosis.   Post intervention, there is a 0% residual stenosis.   Post intervention, the side branch was reduced to 0% residual stenosis.   Successful PCI of the distal Left main into the ostial LCx using IVUS guidance with orbital atherectomy and DES x 1.    Plan: DAPT for a minimum of 6 months. If patient continues to have anginal symptoms he would be a candidate for dedicated CTO PCI of the LAD at a later date. Anticipate DC in am.   ntervention    Implants   ASSESSMENT AND PLAN:  1.  Complete heart block s/p pacemaker insertion. Pacer follow up OK  2. CAD. Complex CAD with left main stenosis. Mid LAD occlusion, tight ostial LCx. Distal LCX and PDA disease. Now s/p PCI of the left main into the LCx in August 2023. Symptomatically much improved.  Will continue current Rx. Management of PDA and distal LCx disease is medication. Continue Plavix   2.  HTN. Well  controlled. Rx refilled.   4. Hypercholesterolemia. Now on Crestor . LDL down to 44.  5. ? Afib  noted on pacer check low burden. Will monitor. Defer anticoagulation for now unless burden increases.    Current medicines are reviewed at length with the patient today.  The patient does not have concerns regarding medicines.  The following changes have been made:  none  Labs/ tests ordered today include:   No orders of the defined types were placed in this encounter.        Disposition:   follow up with me in 6 months  Signed, Cobain Morici [place], MD  12/19/2023 10:46 AM    [facility] [address] [place], [place], [address] Phone [phone], Fax [phone]

## 2023-12-19 ENCOUNTER — Ambulatory Visit: Payer: Self-pay | Attending: Cardiology | Admitting: Cardiology

## 2023-12-19 ENCOUNTER — Encounter: Payer: Self-pay | Admitting: Cardiology

## 2023-12-19 ENCOUNTER — Other Ambulatory Visit (HOSPITAL_COMMUNITY): Payer: Self-pay

## 2023-12-19 VITALS — BP 132/78 | HR 67 | Ht 69.0 in | Wt 206.0 lb

## 2023-12-19 DIAGNOSIS — Z950 Presence of cardiac pacemaker: Secondary | ICD-10-CM

## 2023-12-19 DIAGNOSIS — I442 Atrioventricular block, complete: Secondary | ICD-10-CM

## 2023-12-19 DIAGNOSIS — I25118 Atherosclerotic heart disease of native coronary artery with other forms of angina pectoris: Secondary | ICD-10-CM

## 2023-12-19 DIAGNOSIS — I10 Essential (primary) hypertension: Secondary | ICD-10-CM

## 2023-12-19 DIAGNOSIS — E785 Hyperlipidemia, unspecified: Secondary | ICD-10-CM

## 2023-12-19 MED ORDER — CLOPIDOGREL BISULFATE 75 MG PO TABS
75.0000 mg | ORAL_TABLET | Freq: Every day | ORAL | 3 refills | Status: AC
  Filled 2023-12-19 – 2024-01-29 (×2): qty 90, 90d supply, fill #0
  Filled 2024-05-05: qty 90, 90d supply, fill #1

## 2023-12-19 MED ORDER — LOSARTAN POTASSIUM 50 MG PO TABS
50.0000 mg | ORAL_TABLET | Freq: Every day | ORAL | 3 refills | Status: DC
  Filled 2023-12-19: qty 90, 90d supply, fill #0
  Filled 2024-03-25: qty 90, 90d supply, fill #1

## 2023-12-19 MED ORDER — AMLODIPINE BESYLATE 10 MG PO TABS
10.0000 mg | ORAL_TABLET | Freq: Every day | ORAL | 3 refills | Status: AC
  Filled 2023-12-19 – 2024-03-19 (×2): qty 90, 90d supply, fill #0

## 2023-12-19 MED ORDER — METOPROLOL SUCCINATE ER 50 MG PO TB24
50.0000 mg | ORAL_TABLET | Freq: Every day | ORAL | 3 refills | Status: AC
  Filled 2023-12-19: qty 90, 90d supply, fill #0
  Filled 2024-01-14: qty 30, 30d supply, fill #0
  Filled 2024-02-11: qty 30, 30d supply, fill #1
  Filled 2024-03-12: qty 30, 30d supply, fill #2
  Filled 2024-04-14: qty 30, 30d supply, fill #3
  Filled 2024-05-20: qty 30, 30d supply, fill #4
  Filled 2024-06-23: qty 30, 30d supply, fill #5

## 2023-12-19 MED ORDER — ROSUVASTATIN CALCIUM 40 MG PO TABS
40.0000 mg | ORAL_TABLET | Freq: Every day | ORAL | 3 refills | Status: AC
  Filled 2023-12-19: qty 90, 90d supply, fill #0
  Filled 2024-01-01: qty 30, 30d supply, fill #0
  Filled 2024-02-11: qty 30, 30d supply, fill #1
  Filled 2024-03-12: qty 30, 30d supply, fill #2
  Filled 2024-04-14: qty 30, 30d supply, fill #3
  Filled 2024-05-13: qty 30, 30d supply, fill #4
  Filled 2024-06-17: qty 30, 30d supply, fill #5
  Filled 2024-07-21: qty 30, 30d supply, fill #6

## 2023-12-19 NOTE — Patient Instructions (Addendum)
 Medication Instructions:  The current medical regimen is effective;  continue present plan and medications.  *If you need a refill on your cardiac medications before your next appointment, please call your pharmacy*  Lab Work: None  If you have labs (blood work) drawn today and your tests are completely normal, you will receive your results only by: MyChart Message (if you have MyChart) OR A paper copy in the mail If you have any lab test that is abnormal or we need to change your treatment, we will call you to review the results.  Testing/Procedures: None  Follow-Up: At [facility], you and your health needs are our priority.  As part of our continuing mission to provide you with exceptional heart care, our providers are all part of one team.  This team includes your primary Cardiologist (physician) and Advanced Practice Providers or APPs (Physician Assistants and Nurse Practitioners) who all work together to provide you with the care you need, when you need it.  Your next appointment:   6 months - call in September to book January appointment   Provider:   Dr. [place]  We recommend signing up for the patient portal called MyChart.  Sign up information is provided on this After Visit Summary.  MyChart is used to connect with patients for Virtual Visits (Telemedicine).  Patients are able to view lab/test results, encounter notes, upcoming appointments, etc.  Non-urgent messages can be sent to your provider as well.   To learn more about what you can do with MyChart, go to [email or web address].

## 2024-01-01 ENCOUNTER — Other Ambulatory Visit (HOSPITAL_COMMUNITY): Payer: Self-pay

## 2024-01-01 ENCOUNTER — Other Ambulatory Visit: Payer: Self-pay

## 2024-01-10 ENCOUNTER — Ambulatory Visit: Payer: Self-pay

## 2024-01-10 DIAGNOSIS — I442 Atrioventricular block, complete: Secondary | ICD-10-CM

## 2024-01-11 LAB — CUP PACEART REMOTE DEVICE CHECK
Battery Remaining Longevity: 110 mo
Battery Remaining Percentage: 85 %
Battery Voltage: 3.01 V
Brady Statistic AP VP Percent: 1 %
Brady Statistic AP VS Percent: 5.7 %
Brady Statistic AS VP Percent: 7.5 %
Brady Statistic AS VS Percent: 87 %
Brady Statistic RA Percent Paced: 5.4 %
Brady Statistic RV Percent Paced: 7.6 %
Date Time Interrogation Session: 20250724040023
Implantable Lead Connection Status: 753985
Implantable Lead Connection Status: 753985
Implantable Lead Implant Date: 20230725
Implantable Lead Implant Date: 20230725
Implantable Lead Location: 753859
Implantable Lead Location: 753860
Implantable Pulse Generator Implant Date: 20230725
Lead Channel Impedance Value: 440 Ohm
Lead Channel Impedance Value: 480 Ohm
Lead Channel Pacing Threshold Amplitude: 0.75 V
Lead Channel Pacing Threshold Amplitude: 0.75 V
Lead Channel Pacing Threshold Pulse Width: 0.5 ms
Lead Channel Pacing Threshold Pulse Width: 0.5 ms
Lead Channel Sensing Intrinsic Amplitude: 1.8 mV
Lead Channel Sensing Intrinsic Amplitude: 9.2 mV
Lead Channel Setting Pacing Amplitude: 1 V
Lead Channel Setting Pacing Amplitude: 1.5 V
Lead Channel Setting Pacing Pulse Width: 0.5 ms
Lead Channel Setting Sensing Sensitivity: 2 mV
Pulse Gen Model: 2272
Pulse Gen Serial Number: 8100788

## 2024-01-14 ENCOUNTER — Other Ambulatory Visit (HOSPITAL_COMMUNITY): Payer: Self-pay

## 2024-01-17 ENCOUNTER — Ambulatory Visit: Payer: Self-pay | Admitting: Cardiology

## 2024-01-29 ENCOUNTER — Other Ambulatory Visit (HOSPITAL_COMMUNITY): Payer: Self-pay

## 2024-01-29 ENCOUNTER — Other Ambulatory Visit: Payer: Self-pay

## 2024-02-11 ENCOUNTER — Other Ambulatory Visit (HOSPITAL_COMMUNITY): Payer: Self-pay

## 2024-02-12 ENCOUNTER — Other Ambulatory Visit (HOSPITAL_COMMUNITY): Payer: Self-pay

## 2024-03-12 ENCOUNTER — Other Ambulatory Visit (HOSPITAL_COMMUNITY): Payer: Self-pay

## 2024-03-19 ENCOUNTER — Other Ambulatory Visit: Payer: Self-pay

## 2024-03-19 ENCOUNTER — Other Ambulatory Visit (HOSPITAL_COMMUNITY): Payer: Self-pay

## 2024-03-20 NOTE — Progress Notes (Signed)
 Remote PPM Transmission

## 2024-03-25 ENCOUNTER — Other Ambulatory Visit (HOSPITAL_COMMUNITY): Payer: Self-pay

## 2024-04-10 ENCOUNTER — Ambulatory Visit: Payer: Self-pay

## 2024-04-10 DIAGNOSIS — I442 Atrioventricular block, complete: Secondary | ICD-10-CM

## 2024-04-10 LAB — CUP PACEART REMOTE DEVICE CHECK
Battery Remaining Longevity: 108 mo
Battery Remaining Percentage: 83 %
Battery Voltage: 3.01 V
Brady Statistic AP VP Percent: 1 %
Brady Statistic AP VS Percent: 6.6 %
Brady Statistic AS VP Percent: 9.1 %
Brady Statistic AS VS Percent: 84 %
Brady Statistic RA Percent Paced: 6.4 %
Brady Statistic RV Percent Paced: 9.3 %
Date Time Interrogation Session: 20251023040013
Implantable Lead Connection Status: 753985
Implantable Lead Connection Status: 753985
Implantable Lead Implant Date: 20230725
Implantable Lead Implant Date: 20230725
Implantable Lead Location: 753859
Implantable Lead Location: 753860
Implantable Pulse Generator Implant Date: 20230725
Lead Channel Impedance Value: 480 Ohm
Lead Channel Impedance Value: 530 Ohm
Lead Channel Pacing Threshold Amplitude: 0.75 V
Lead Channel Pacing Threshold Amplitude: 0.75 V
Lead Channel Pacing Threshold Pulse Width: 0.5 ms
Lead Channel Pacing Threshold Pulse Width: 0.5 ms
Lead Channel Sensing Intrinsic Amplitude: 1.9 mV
Lead Channel Sensing Intrinsic Amplitude: 10.1 mV
Lead Channel Setting Pacing Amplitude: 1 V
Lead Channel Setting Pacing Amplitude: 1.5 V
Lead Channel Setting Pacing Pulse Width: 0.5 ms
Lead Channel Setting Sensing Sensitivity: 2 mV
Pulse Gen Model: 2272
Pulse Gen Serial Number: 8100788

## 2024-04-11 NOTE — Progress Notes (Signed)
 Remote PPM Transmission

## 2024-04-14 ENCOUNTER — Ambulatory Visit: Payer: Self-pay | Admitting: Cardiology

## 2024-04-14 ENCOUNTER — Other Ambulatory Visit (HOSPITAL_COMMUNITY): Payer: Self-pay

## 2024-05-05 ENCOUNTER — Other Ambulatory Visit (HOSPITAL_COMMUNITY): Payer: Self-pay

## 2024-05-13 ENCOUNTER — Other Ambulatory Visit (HOSPITAL_COMMUNITY): Payer: Self-pay

## 2024-05-16 ENCOUNTER — Other Ambulatory Visit (HOSPITAL_COMMUNITY): Payer: Self-pay

## 2024-05-20 ENCOUNTER — Other Ambulatory Visit (HOSPITAL_COMMUNITY): Payer: Self-pay

## 2024-06-15 NOTE — Progress Notes (Unsigned)
 "  Cardiology Office Note   Date:  06/17/2024  ID:  Colin Pena, Colin Pena 09/29/1955, MRN [id number] PCP:  Purcell Emil Schanz, MD  Cardiologist:  Redell Shallow, MD  Electrophysiologist:  OLE ONEIDA HOLTS, MD   Chief Complaint: Follow up for CAD  History of Present Illness: .   Colin Pena is a 68 y.o. male with visit-pertinent history of CAD, hypertension, hyperlipidemia and history of Abbott pacemaker placement on 01/10/2022.  Patient initially presented in July 2023 with near syncope, acute dyspnea and fatigue, found to have complete heart block.  Echocardiogram showed apical akinesis and dyskinesis with a EF of 55%.  Due to wall motion abnormality subsequent cardiac catheterization was performed which showed CTO of mid LAD, moderate distal left main disease of 65%, 90% ostial left circumflex lesion, 80% mid PDA lesion.  MRI viability test showed EF 56%, there was late gadolinium enhancement of the myocardium suggesting substantial viability of the anterior wall.  Patient was seen by CT surgery who felt LAD was not graftable.  He eventually underwent dual-chamber pacemaker placement on 01/10/2022, afterward he atherectomy and stenting of the left main into the left circumflex artery on 02/07/2022.  Patient was last seen in clinic by Dr. Jordan on 12/19/2023 for follow-up.  He had remained stable from a cardiac standpoint.  Today he presents for follow up. He reports that he has been doing well. He denies chest pain, lower extremity edema, orthopnea or pnd.  He denies any palpitations, presyncope or syncope.  He notes occasional calf cramps at night, denies any calf pain with exertion.  Patient notes that he does walk some and tolerates well, has not been doing intentional exercise.  He denies any cardiac concerns or complaints today.   ROS: .   Today he denies chest pain, shortness of breath, lower extremity edema, fatigue, palpitations, melena, hematuria, hemoptysis, diaphoresis,  weakness, presyncope, syncope, orthopnea, and PND.  All other systems are reviewed and otherwise negative. Studies Reviewed: SABRA   EKG:  EKG is ordered today, personally reviewed, demonstrating  EKG Interpretation Date/Time:  Tuesday June 17 2024 10:44:09 EST Ventricular Rate:  68 PR Interval:  164 QRS Duration:  134 QT Interval:  428 QTC Calculation: 455 R Axis:   -37  Text Interpretation: Normal sinus rhythm Left axis deviation Right bundle branch block When compared with ECG of 12-Jul-2023 08:45, Minimal criteria for Anterior infarct are no longer Present Confirmed by Kori Colin [phone]) on 06/17/2024 1:34:39 PM   CV Studies: Cardiac studies reviewed are outlined and summarized above. Otherwise please see EMR for full report. Cardiac Studies & Procedures   ______________________________________________________________________________________________ CARDIAC CATHETERIZATION  CARDIAC CATHETERIZATION 02/07/2022  Conclusion   Mid LM lesion is 25% stenosed.   Dist LM to Ost LAD lesion is 65% stenosed with 90% stenosed side branch in Ost Cx to Prox Cx.   Mid LAD lesion is 100% stenosed.   1st Diag lesion is 80% stenosed.   Dist Cx lesion is 99% stenosed.   2nd Mrg lesion is 70% stenosed.   A drug-eluting stent was successfully placed using a SYNERGY XD 4.0X24.   A stent was successfully placed.   Post intervention, there is a 0% residual stenosis.   Post intervention, there is a 0% residual stenosis.   Post intervention, the side branch was reduced to 0% residual stenosis.  Successful PCI of the distal Left main into the ostial LCx using IVUS guidance with orbital atherectomy and DES x 1.  Plan: DAPT for  a minimum of 6 months. If patient continues to have anginal symptoms he would be a candidate for dedicated CTO PCI of the LAD at a later date. Anticipate DC in am.  Findings Coronary Findings Diagnostic  Dominance: Right  Left Main Vessel is large. Mid LM lesion is 25%  stenosed. Dist LM to Ost LAD lesion is 65% stenosed with 90% stenosed side branch in Ost Cx to Prox Cx. The lesion is severely calcified.  Left Anterior Descending Vessel is large. Mid LAD lesion is 100% stenosed. The lesion is chronically occluded with bridging collateral flow.  First Diagonal Branch Vessel is small in size. 1st Diag lesion is 80% stenosed.  Second Diagonal Branch Vessel is small in size. Collaterals 2nd Diag filled by collaterals from 1st Diag.  Collaterals 2nd Diag filled by collaterals from 2nd Mrg.  Left Circumflex Vessel is large. Dist Cx lesion is 99% stenosed.  First Obtuse Marginal Branch Vessel is small in size.  Second Obtuse Marginal Branch Vessel is large in size. 2nd Mrg lesion is 70% stenosed.  Right Coronary Artery Vessel is large. There is mild diffuse disease throughout the vessel. The vessel is severely calcified. The vessel is moderately tortuous. Mid RCA lesion is 50% stenosed.  Right Posterior Descending Artery Vessel is moderate in size. RPDA-1 lesion is 70% stenosed. RPDA-2 lesion is 85% stenosed.  Right Posterior Atrioventricular Artery Vessel is small in size.  First Right Posterolateral Branch Vessel is small in size.  Intervention  Mid LM lesion Stent (Also treats lesions: Dist LM to Ost LAD with side branch in Ost Cx to Prox Cx) A stent was successfully placed. Post-Intervention Lesion Assessment The intervention was successful. Pre-interventional TIMI flow is 3. Post-intervention TIMI flow is 3. No complications occurred at this lesion. There is a 0% residual stenosis post intervention.  Dist LM to Ost LAD lesion with side branch in Ost Cx to Prox Cx Stent - Side Branch CATH LAUNCHER 6FR EBU3.5 guide catheter was inserted. Lesion crossed with guidewire using a WIRE ASAHI PROWATER 180CM. Pre-stent angioplasty was performed using a BALLN SCOREFLEX 3.0X15. A drug-eluting stent was successfully placed using a SYNERGY  XD 4.0X24. Stent strut is well apposed. Post-stent angioplasty was performed using a BALL SAPPHIRE NC24 4.5X15. Maximum pressure:  20 atm. Stent - Main Branch (Also treats lesions: Mid LM) See details in Mid LM lesion. Atherectomy - Main Branch WIRE VIPERWIRE COR FLEX .012 guidewire was used to cross lesion. Orbital atherectomy was performed using a CROWN DIAMONDBACK CLASSIC 1.25. 6 passes taken. Post-Intervention Lesion Assessment The intervention was successful. Pre-interventional TIMI flow is 3. Post-intervention TIMI flow is 3. No complications occurred at this lesion. Ultrasound (IVUS) was performed on the lesion post PCI using a CATH OPTICROSS HD. Stent well expanded. An additional ultrasound (IVUS) was performed on the lesion post PCI. Minimum lumen diameter:  10.5 mm. There is a 0% residual stenosis in the main branch post intervention. There is a 0% residual stenosis in the side branch post intervention.   CARDIAC CATHETERIZATION  CARDIAC CATHETERIZATION 01/06/2022  Conclusion Conclusions: Severe three-vessel coronary artery disease, as detailed below. Mildly elevated left ventricular filling pressure.  Recommendations: Cardiac MRI for myocardial viability with attention on LAD territory. Cardiac surgery consultation and Heart Team evaluation to discuss optimal revascularization strategy. Follow-up EP recommendations. Aggressive secondary prevention of coronary artery disease.  Colin Hanson, MD [facility]  Findings Coronary Findings Diagnostic  Dominance: Right  Left Main Vessel is large. Mid LM lesion is 25% stenosed.  Dist LM to Ost LAD lesion is 65% stenosed with 90% stenosed side branch in Ost Cx to Prox Cx. The lesion is severely calcified.  Left Anterior Descending Vessel is large. Mid LAD lesion is 100% stenosed. The lesion is chronically occluded with bridging collateral flow.  First Diagonal Branch Vessel is small in size. 1st Diag lesion is 80%  stenosed.  Second Diagonal Branch Vessel is small in size. Collaterals 2nd Diag filled by collaterals from 1st Diag.  Collaterals 2nd Diag filled by collaterals from 2nd Mrg.  Left Circumflex Vessel is large. Mid Cx to Dist Cx lesion is 99% stenosed with 70% stenosed side branch in 2nd Mrg.  First Obtuse Marginal Branch Vessel is small in size.  Second Obtuse Marginal Branch Vessel is large in size.  Right Coronary Artery Vessel is large. There is mild diffuse disease throughout the vessel. The vessel is severely calcified. The vessel is moderately tortuous. Mid RCA lesion is 50% stenosed.  Right Posterior Descending Artery Vessel is moderate in size. RPDA-1 lesion is 70% stenosed. RPDA-2 lesion is 85% stenosed.  Right Posterior Atrioventricular Artery Vessel is small in size.  First Right Posterolateral Branch Vessel is small in size.  Intervention  No interventions have been documented.     ECHOCARDIOGRAM  ECHOCARDIOGRAM COMPLETE 01/06/2022  Narrative ECHOCARDIOGRAM REPORT    Patient Name:   MCGREGOR TINNON Gude Date of Exam: 01/06/2022 Medical Rec #:  [id number]          Height:       68.0 in Accession #:    [id number]         Weight:       198.0 lb Date of Birth:  02/26/1956          BSA:          2.035 m Patient Age:    66 years           BP:           156/85 mmHg Patient Gender: M                  HR:           47 bpm. Exam Location:  Inpatient  Procedure: 2D Echo, Cardiac Doppler and Color Doppler  STAT ECHO  Indications:    Heart block  History:        Patient has no prior history of Echocardiogram examinations. Risk Factors:Hypertension.  Sonographer:    Waddell Captain Referring Phys: [phone] CHRISTOPHER RONALD BERGE  IMPRESSIONS   1. Patient in complete heart block during study Small area of apical akinesis /dyskinesis . Left ventricular ejection fraction, by estimation, is 55%. The left ventricle has normal function. The left ventricle  demonstrates regional wall motion abnormalities (see scoring diagram/findings for description). There is mild left ventricular hypertrophy. Left ventricular diastolic parameters are indeterminate. 2. Right ventricular systolic function is normal. The right ventricular size is normal. 3. The mitral valve is normal in structure. No evidence of mitral valve regurgitation. No evidence of mitral stenosis. 4. The aortic valve is tricuspid. There is mild calcification of the aortic valve. Aortic valve regurgitation is not visualized. Aortic valve sclerosis is present, with no evidence of aortic valve stenosis. 5. The inferior vena cava is normal in size with greater than 50% respiratory variability, suggesting right atrial pressure of 3 mmHg.  FINDINGS Left Ventricle: Patient in complete heart block during study Small area of apical akinesis /dyskinesis. Left ventricular ejection fraction, by estimation, is 55%.  The left ventricle has normal function. The left ventricle demonstrates regional wall motion abnormalities. The left ventricular internal cavity size was normal in size. There is mild left ventricular hypertrophy. Left ventricular diastolic parameters are indeterminate.  Right Ventricle: The right ventricular size is normal. No increase in right ventricular wall thickness. Right ventricular systolic function is normal.  Left Atrium: Left atrial size was normal in size.  Right Atrium: Right atrial size was normal in size.  Pericardium: There is no evidence of pericardial effusion.  Mitral Valve: The mitral valve is normal in structure. No evidence of mitral valve regurgitation. No evidence of mitral valve stenosis.  Tricuspid Valve: The tricuspid valve is normal in structure. Tricuspid valve regurgitation is not demonstrated. No evidence of tricuspid stenosis.  Aortic Valve: The aortic valve is tricuspid. There is mild calcification of the aortic valve. Aortic valve regurgitation is not  visualized. Aortic valve sclerosis is present, with no evidence of aortic valve stenosis. Aortic valve peak gradient measures 7.0 mmHg.  Pulmonic Valve: The pulmonic valve was normal in structure. Pulmonic valve regurgitation is not visualized. No evidence of pulmonic stenosis.  Aorta: The aortic root is normal in size and structure.  Venous: The inferior vena cava is normal in size with greater than 50% respiratory variability, suggesting right atrial pressure of 3 mmHg.  IAS/Shunts: No atrial level shunt detected by color flow Doppler.   LEFT VENTRICLE PLAX 2D LVIDd:         4.50 cm      Diastology LVIDs:         2.80 cm      LV e' medial:    7.29 cm/s LV PW:         1.10 cm      LV E/e' medial:  11.8 LV IVS:        1.20 cm      LV e' lateral:   8.92 cm/s LVOT diam:     2.00 cm      LV E/e' lateral: 9.6 LV SV:         77 LV SV Index:   38 LVOT Area:     3.14 cm  LV Volumes (MOD) LV vol d, MOD A2C: 113.0 ml LV vol d, MOD A4C: 95.5 ml LV vol s, MOD A2C: 47.4 ml LV vol s, MOD A4C: 40.5 ml LV SV MOD A2C:     65.6 ml LV SV MOD A4C:     95.5 ml LV SV MOD BP:      59.9 ml  RIGHT VENTRICLE             IVC RV Basal diam:  2.90 cm     IVC diam: 1.50 cm RV Mid diam:    2.60 cm RV S prime:     13.40 cm/s TAPSE (M-mode): 2.4 cm  LEFT ATRIUM             Index        RIGHT ATRIUM           Index LA diam:        3.50 cm 1.72 cm/m   RA Area:     14.50 cm LA Vol (A2C):   33.4 ml 16.41 ml/m  RA Volume:   35.00 ml  17.20 ml/m LA Vol (A4C):   30.3 ml 14.89 ml/m LA Biplane Vol: 31.5 ml 15.48 ml/m AORTIC VALVE AV Area (Vmax): 2.97 cm AV Vmax:        132.00 cm/s AV Peak  Grad:   7.0 mmHg LVOT Vmax:      125.00 cm/s LVOT Vmean:     79.000 cm/s LVOT VTI:       0.246 m  AORTA Ao Root diam: 3.00 cm Ao Asc diam:  2.80 cm  MITRAL VALVE               TRICUSPID VALVE MV Area (PHT): 4.19 cm    TR Peak grad:   25.8 mmHg MV Decel Time: 181 msec    TR Vmax:        254.00 cm/s MV E  velocity: 85.70 cm/s MV A velocity: 69.80 cm/s  SHUNTS MV E/A ratio:  1.23        Systemic VTI:  0.25 m Systemic Diam: 2.00 cm  Colin Emmer MD Electronically signed by Colin Emmer MD Signature Date/Time: 01/06/2022/9:40:40 AM    Final        CARDIAC MRI  MR CARDIAC MORPHOLOGY W WO CONTRAST 01/09/2022  Narrative CLINICAL DATA:  58M with HTN p/w 2:1 AV block, intermittent complete heart block. Echo 01/06/22 with EF 55% (apical akinesis/dyskinesis), normal RV function, no significant valvular disease. Cath 7/21 showed CTO mid LAD, 65% distal LM, 80% D1, 99% mid to distal LCX, 50% mid RCA, 85% RPDA. CMR to evaluate viability.  EXAM: CARDIAC MRI  TECHNIQUE: The patient was scanned on a 1.5 Tesla Siemens magnet. A dedicated cardiac coil was used. Functional imaging was done using Fiesta sequences. 2,3, and 4 chamber views were done to assess for RWMA's. Modified Simpson's rule using a short axis stack was used to calculate an ejection fraction on a dedicated work [employer]. The patient received 10 cc of Gadavist . After 10 minutes inversion recovery sequences were used to assess for infiltration and scar tissue.  CONTRAST:  10 cc  of Gadavist   FINDINGS: Left ventricle:  -Mild hypertrophy  -Normal size  -Normal global systolic function. Apical anterior akinesis. Small apical aneurysm  -Normal ECV (27%)  -Normal T2 values  -Subendocardial LGE consistent with prior infarct in mid to apical anterior wall and apex. LGE<50% transmural in mid anterior wall, suggesting viability. LGE >50% transmural in apical anterior wall and apex, suggesting nonviability in distal LAD territory.  LV EF: 56% (Normal 56-78%)  Absolute volumes:  LV EDV: (Normal 77-195 mL)  LV ESV: 48mL (Normal 19-72 mL)  LV SV: 60mL (Normal 51-133 mL)  CO: 3.8L/min (Normal 2.8-8.8 L/min)  Indexed volumes:  LV EDV: 21mL/sq-m (Normal 47-92 mL/sq-m)  LV ESV: 30mL/sq-m  (Normal 13-30 mL/sq-m)  LV SV: 23mL/sq-m (Normal 32-62 mL/sq-m)  CI: 1.9L/min/sq-m (Normal 1.7-4.2 L/min/sq-m)  Right ventricle: Normal size and systolic function  RV EF:  55% (Normal 47-74%)  Absolute volumes:  RV EDV: (Normal 88-227 mL)  RV ESV: 50mL (Normal 23-103 mL)  RV SV: 61mL (Normal 52-138 mL)  CO: 3.9L/min (Normal 2.8-8.8 L/min)  Indexed volumes:  RV EDV: 50mL/sq-m (Normal 55-105 mL/sq-m)  RV ESV: 97mL/sq-m (Normal 15-43 mL/sq-m)  RV SV: 3mL/sq-m (Normal 32-64 mL/sq-m)  CI: 1.9L/min/sq-m (Normal 1.7-4.2 L/min/sq-m)  Left atrium: Normal size  Right atrium: Normal size  Mitral valve: Trivial regurgitation  Aortic valve: Tricuspid. No regurgitation  Tricuspid valve: Trivial regurgitation  Pulmonic valve: No regurgitation  Aorta: Normal proximal ascending aorta  Pericardium: Normal  IMPRESSION: 1. 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 LGE is less than 50% transmural in mid anterior wall, suggesting viability. LGE is greater than 50%  transmural in apical anterior wall and apex, suggesting nonviability in distal LAD territory.  2. Normal LV size, mild hypertrophy, and normal global systolic function (EF 56%). Apical anterior akinesis. Small apical aneurysm.  3.  Normal RV size and systolic function (EF 55%)   Electronically Signed By: Colin Pena M.D. On: 01/09/2022 21:58   ______________________________________________________________________________________________       Current Reported Medications:.    Active Medications[1]  Physical Exam:    VS:  BP (!) 144/82   Pulse 68   Ht 5' 9 (1.753 m)   Wt 210 lb 12.8 oz (95.6 kg)   SpO2 99%   BMI 31.13 kg/m    Wt Readings from Last 3 Encounters:  06/17/24 210 lb 12.8 oz (95.6 kg)  12/19/23 206 lb (93.4 kg)  07/12/23 207 lb (93.9 kg)    GEN: Well nourished, well developed in no acute  distress NECK: No JVD; No carotid bruits CARDIAC: RRR, no murmurs, rubs, gallops RESPIRATORY:  Clear to auscultation without rales, wheezing or rhonchi  ABDOMEN: Soft, non-tender, non-distended EXTREMITIES:  No edema; No acute deformity     Asessement and Plan:.    CAD: Patient with history of complex CAD with left main stenosis.  Mid LAD occlusion, tight ostial LCx.  Distal LCx and PDA disease s/p PCI of the left main into the LCx in August 2023. Stable with no anginal symptoms. No indication for ischemic evaluation.  Heart healthy diet and regular cardiovascular exercise encouraged.  Reviewed ED precautions.  Continue amlodipine  10 mg daily, Plavix  75 mg daily, losartan , metoprolol  succinate 50 mg nightly and Crestor  40 mg daily.  Will increase losartan  to 100 mg daily as noted below.  Check CBC, c-Met and fasting lipid profile today.  Hypertension: Initial blood pressure today 144/86, on recheck 144/82.  Patient reports that his systolic pressures consistently in the low 140s at home.  Will increase losartan  to 100 mg daily.  Check c-Met today and repeat basic metabolic profile in 2 weeks.  Encourage patient to notify the office if blood pressures consistently elevated 130/80.  Hyperlipidemia: Patient due for fasting lipid profile, check today with c-Met.  Pending lab work continue Crestor  40 mg daily.  Complete heart block: S/p pacemaker insertion.  Followed by EP.   Disposition: F/u with Dr. Jordan in six months or sooner if needed.   Signed, Mahesh Sizemore D Banks Chaikin, NP       [1]  Current Meds  Medication Sig   acetaminophen  (TYLENOL ) 325 MG tablet Take 2 tablets (650 mg total) by mouth every 4 (four) hours as needed for headache or mild pain. (Patient taking differently: Take 325 mg by mouth every 4 (four) hours as needed for headache or mild pain (pain score 1-3).)   amLODipine  (NORVASC ) 10 MG tablet Take 1 tablet (10 mg total) by mouth daily.   clopidogrel  (PLAVIX ) 75 MG tablet Take 1  tablet (75 mg total) by mouth daily.   metoprolol  succinate (TOPROL -XL) 50 MG 24 hr tablet Take 1 tablet (50 mg total) by mouth at bedtime. Take with or immediately following a meal.   nitroGLYCERIN  (NITROSTAT ) 0.4 MG SL tablet Place 1 tablet (0.4 mg total) under the tongue every 5 (five) minutes x 3 doses as needed for chest pain.   NONFORMULARY OR COMPOUNDED ITEM Triamcinolone  0.1%/Eucerin. Unknown ratio.1 application topically daily as directed. (Patient taking differently: Triamcinolone  0.1%/Eucerin. Unknown ratio.1 application topically daily as needed for eczema)   rosuvastatin  (CRESTOR ) 40 MG tablet Take 1 tablet (40 mg  total) by mouth daily.   triamcinolone  (KENALOG ) 0.025 % ointment Apply 1 Application topically 2 (two) times daily.   triamcinolone  0.1%-Eucerin equivalent 1:1 cream mixture Apply to affected area topically once daily.   [DISCONTINUED] losartan  (COZAAR ) 50 MG tablet Take 1 tablet (50 mg total) by mouth daily.   "

## 2024-06-17 ENCOUNTER — Encounter: Payer: Self-pay | Admitting: Cardiology

## 2024-06-17 ENCOUNTER — Other Ambulatory Visit (HOSPITAL_COMMUNITY): Payer: Self-pay

## 2024-06-17 ENCOUNTER — Ambulatory Visit: Payer: Self-pay | Admitting: Cardiology

## 2024-06-17 VITALS — BP 144/82 | HR 68 | Ht 69.0 in | Wt 210.8 lb

## 2024-06-17 DIAGNOSIS — I10 Essential (primary) hypertension: Secondary | ICD-10-CM

## 2024-06-17 DIAGNOSIS — E785 Hyperlipidemia, unspecified: Secondary | ICD-10-CM

## 2024-06-17 DIAGNOSIS — I25118 Atherosclerotic heart disease of native coronary artery with other forms of angina pectoris: Secondary | ICD-10-CM

## 2024-06-17 DIAGNOSIS — Z950 Presence of cardiac pacemaker: Secondary | ICD-10-CM

## 2024-06-17 DIAGNOSIS — I442 Atrioventricular block, complete: Secondary | ICD-10-CM

## 2024-06-17 MED ORDER — LOSARTAN POTASSIUM 100 MG PO TABS
100.0000 mg | ORAL_TABLET | Freq: Every day | ORAL | 11 refills | Status: AC
  Filled 2024-06-17: qty 30, 30d supply, fill #0

## 2024-06-17 NOTE — Patient Instructions (Signed)
 Medication Instructions:  INCREASE: Losartan  to 100 mg (1 tablet) daily *If you need a refill on your cardiac medications before your next appointment, please call your pharmacy*  Lab Work: TODAY: Fasting Lipids, CBC, CMET  In 2 weeks: BMET  If you have labs (blood work) drawn today and your tests are completely normal, you will receive your results only by: MyChart Message (if you have MyChart) OR A paper copy in the mail If you have any lab test that is abnormal or we need to change your treatment, we will call you to review the results.  Testing/Procedures: NONE  Follow-Up: At [facility], you and your health needs are our priority.  As part of our continuing mission to provide you with exceptional heart care, our providers are all part of one team.  This team includes your primary Cardiologist (physician) and Advanced Practice Providers or APPs (Physician Assistants and Nurse Practitioners) who all work together to provide you with the care you need, when you need it.  Your next appointment:   6 month(s)  Provider:   Dr. Jordan

## 2024-06-23 ENCOUNTER — Other Ambulatory Visit (HOSPITAL_COMMUNITY): Payer: Self-pay

## 2024-07-10 ENCOUNTER — Ambulatory Visit (INDEPENDENT_AMBULATORY_CARE_PROVIDER_SITE_OTHER): Payer: Self-pay

## 2024-07-10 DIAGNOSIS — I25118 Atherosclerotic heart disease of native coronary artery with other forms of angina pectoris: Secondary | ICD-10-CM

## 2024-07-15 ENCOUNTER — Ambulatory Visit: Payer: Self-pay | Admitting: Cardiology

## 2024-07-15 ENCOUNTER — Encounter: Payer: Self-pay | Admitting: Student

## 2024-07-15 ENCOUNTER — Ambulatory Visit: Payer: Self-pay | Attending: Physician Assistant | Admitting: Student

## 2024-07-15 VITALS — BP 152/82 | HR 93 | Ht 69.0 in | Wt 208.8 lb

## 2024-07-15 DIAGNOSIS — I25118 Atherosclerotic heart disease of native coronary artery with other forms of angina pectoris: Secondary | ICD-10-CM

## 2024-07-15 DIAGNOSIS — Z950 Presence of cardiac pacemaker: Secondary | ICD-10-CM

## 2024-07-15 DIAGNOSIS — I442 Atrioventricular block, complete: Secondary | ICD-10-CM

## 2024-07-15 DIAGNOSIS — I10 Essential (primary) hypertension: Secondary | ICD-10-CM

## 2024-07-15 LAB — CUP PACEART INCLINIC DEVICE CHECK
Battery Remaining Longevity: 110 mo
Battery Voltage: 3.01 V
Brady Statistic RA Percent Paced: 6.1 %
Brady Statistic RV Percent Paced: 9 %
Date Time Interrogation Session: 20260127094851
Implantable Lead Connection Status: 753985
Implantable Lead Connection Status: 753985
Implantable Lead Implant Date: 20230725
Implantable Lead Implant Date: 20230725
Implantable Lead Location: 753859
Implantable Lead Location: 753860
Implantable Pulse Generator Implant Date: 20230725
Lead Channel Impedance Value: 462.5 Ohm
Lead Channel Impedance Value: 487.5 Ohm
Lead Channel Pacing Threshold Amplitude: 0.75 V
Lead Channel Pacing Threshold Amplitude: 0.75 V
Lead Channel Pacing Threshold Amplitude: 0.75 V
Lead Channel Pacing Threshold Pulse Width: 0.5 ms
Lead Channel Pacing Threshold Pulse Width: 0.5 ms
Lead Channel Pacing Threshold Pulse Width: 0.5 ms
Lead Channel Sensing Intrinsic Amplitude: 2.4 mV
Lead Channel Sensing Intrinsic Amplitude: 9.9 mV
Lead Channel Setting Pacing Amplitude: 1 V
Lead Channel Setting Pacing Amplitude: 1.5 V
Lead Channel Setting Pacing Pulse Width: 0.5 ms
Lead Channel Setting Sensing Sensitivity: 2 mV
Pulse Gen Model: 2272
Pulse Gen Serial Number: 8100788

## 2024-07-15 NOTE — Progress Notes (Signed)
" °  Electrophysiology Office Note:   ID:  Colin, Pena [birth year], MRN [id number]  Primary Cardiologist: Redell Shallow, MD Electrophysiologist: Soyla Gladis Norton, MD      History of Present Illness:   Colin Pena is a 69 y.o. male with h/o CAD, CHB s/p PPM, and HTN seen today for routine electrophysiology followup.   Since last being seen in our clinic the patient reports doing very well. Overall, he denies chest pain, palpitations, dyspnea, PND, orthopnea, nausea, vomiting, dizziness, syncope, edema, weight gain, or early satiety.   Review of systems complete and found to be negative unless listed in HPI.   EP Information / Studies Reviewed:    EKG is not ordered today. EKG from 06/17/2024 reviewed which showed NSR at 68 bpm       PPM Interrogation-  reviewed in detail today,  See PACEART report.  Arrhythmia/Device History Abbott Dual Chamber PPM implanted 12/2021 for CHB  WC   Physical Exam:   VS:  BP (!) 152/82   Pulse 93   Ht 5' 9 (1.753 m)   Wt 208 lb 12.8 oz (94.7 kg)   SpO2 97%   BMI 30.83 kg/m    Wt Readings from Last 3 Encounters:  07/15/24 208 lb 12.8 oz (94.7 kg)  06/17/24 210 lb 12.8 oz (95.6 kg)  12/19/23 206 lb (93.4 kg)     GEN: No acute distress  NECK: No JVD; No carotid bruits CARDIAC: Regular rate and rhythm, no murmurs, rubs, gallops RESPIRATORY:  Clear to auscultation without rales, wheezing or rhonchi  ABDOMEN: Soft, non-tender, non-distended EXTREMITIES:  No edema; No deformity   ASSESSMENT AND PLAN:    CHB s/p Abbott PPM  Normal PPM function See Pace Art report No changes today  CAD No s/s of ischemia.      HTN Encouraged continued home monitoring of BP If > 140 systolic regularly, encouraged PCP follow up Encouraged lifestyle modification and sodium restriction   Disposition:   Follow up with EP Team in 12 months  Signed, Ozell Prentice Passey, PA-C  "

## 2024-07-15 NOTE — Patient Instructions (Signed)
 Medication Instructions:  No medication changes today. *If you need a refill on your cardiac medications before your next appointment, please call your pharmacy*  Lab Work: No labwork ordered today. If you have labs (blood work) drawn today and your tests are completely normal, you will receive your results only by: MyChart Message (if you have MyChart) OR A paper copy in the mail If you have any lab test that is abnormal or we need to change your treatment, we will call you to review the results.  Testing/Procedures: No testing ordered today  Follow-Up: At [facility], you and your health needs are our priority.  As part of our continuing mission to provide you with exceptional heart care, our providers are all part of one team.  This team includes your primary Cardiologist (physician) and Advanced Practice Providers or APPs (Physician Assistants and Nurse Practitioners) who all work together to provide you with the care you need, when you need it.  Your next appointment:   12 month(s)  Provider:   You may see Will Cortland Ding, MD or one of the following Advanced Practice Providers on your designated Care Team:   Mertha Abrahams, [place] [address] [place], PA-C Suzann Riddle, NP Creighton Doffing, NP    We recommend signing up for the patient portal called "MyChart".  Sign up information is provided on this After Visit Summary.  MyChart is used to connect with patients for Virtual Visits (Telemedicine).  Patients are able to view lab/test results, encounter notes, upcoming appointments, etc.  Non-urgent messages can be sent to your provider as well.   To learn more about what you can do with MyChart, go to [email or web address].

## 2024-07-16 LAB — CUP PACEART REMOTE DEVICE CHECK
Battery Remaining Longevity: 104 mo
Battery Remaining Percentage: 80 %
Battery Voltage: 3.01 V
Brady Statistic AP VP Percent: 1 %
Brady Statistic AP VS Percent: 6.4 %
Brady Statistic AS VP Percent: 8.8 %
Brady Statistic AS VS Percent: 84 %
Brady Statistic RA Percent Paced: 6.1 %
Brady Statistic RV Percent Paced: 9.1 %
Date Time Interrogation Session: 20260127135749
Implantable Lead Connection Status: 753985
Implantable Lead Connection Status: 753985
Implantable Lead Implant Date: 20230725
Implantable Lead Implant Date: 20230725
Implantable Lead Location: 753859
Implantable Lead Location: 753860
Implantable Pulse Generator Implant Date: 20230725
Lead Channel Impedance Value: 460 Ohm
Lead Channel Impedance Value: 490 Ohm
Lead Channel Pacing Threshold Amplitude: 0.75 V
Lead Channel Pacing Threshold Amplitude: 0.75 V
Lead Channel Pacing Threshold Pulse Width: 0.5 ms
Lead Channel Pacing Threshold Pulse Width: 0.5 ms
Lead Channel Sensing Intrinsic Amplitude: 2.4 mV
Lead Channel Sensing Intrinsic Amplitude: 9.9 mV
Lead Channel Setting Pacing Amplitude: 1 V
Lead Channel Setting Pacing Amplitude: 1.5 V
Lead Channel Setting Pacing Pulse Width: 0.5 ms
Lead Channel Setting Sensing Sensitivity: 2 mV
Pulse Gen Model: 2272
Pulse Gen Serial Number: 8100788

## 2024-07-17 ENCOUNTER — Ambulatory Visit: Payer: Self-pay | Admitting: Cardiology

## 2024-07-17 NOTE — Progress Notes (Signed)
 Remote PPM Transmission

## 2024-07-21 ENCOUNTER — Other Ambulatory Visit (HOSPITAL_COMMUNITY): Payer: Self-pay

## 2024-10-09 ENCOUNTER — Ambulatory Visit: Payer: Self-pay

## 2025-01-08 ENCOUNTER — Ambulatory Visit: Payer: Self-pay

## 2025-04-09 ENCOUNTER — Ambulatory Visit: Payer: Self-pay

## 2025-07-09 ENCOUNTER — Ambulatory Visit: Payer: Self-pay

## 2025-10-08 ENCOUNTER — Ambulatory Visit: Payer: Self-pay
